# Patient Record
Sex: Male | Born: 1990
Health system: Southern US, Community
[De-identification: ages and names within clinical notes are randomized; demographics above are authoritative.]

## PROBLEM LIST (undated history)

## (undated) VITALS — BP 101/66 | HR 120 | Temp 97.9°F | Resp 20 | Ht 62.0 in | Wt 117.0 lb

## (undated) DIAGNOSIS — Z973 Presence of spectacles and contact lenses: Secondary | ICD-10-CM

## (undated) DIAGNOSIS — F419 Anxiety disorder, unspecified: Secondary | ICD-10-CM

## (undated) DIAGNOSIS — M549 Dorsalgia, unspecified: Secondary | ICD-10-CM

## (undated) DIAGNOSIS — H3530 Unspecified macular degeneration: Secondary | ICD-10-CM

## (undated) DIAGNOSIS — M1990 Unspecified osteoarthritis, unspecified site: Secondary | ICD-10-CM

## (undated) DIAGNOSIS — Z950 Presence of cardiac pacemaker: Secondary | ICD-10-CM

## (undated) DIAGNOSIS — G8929 Other chronic pain: Secondary | ICD-10-CM

## (undated) DIAGNOSIS — G43909 Migraine, unspecified, not intractable, without status migrainosus: Secondary | ICD-10-CM

## (undated) DIAGNOSIS — K219 Gastro-esophageal reflux disease without esophagitis: Secondary | ICD-10-CM

## (undated) DIAGNOSIS — R519 Headache, unspecified: Secondary | ICD-10-CM

## (undated) DIAGNOSIS — I429 Cardiomyopathy, unspecified: Secondary | ICD-10-CM

## (undated) DIAGNOSIS — R51 Headache: Secondary | ICD-10-CM

## (undated) DIAGNOSIS — Q246 Congenital heart block: Secondary | ICD-10-CM

## (undated) DIAGNOSIS — F909 Attention-deficit hyperactivity disorder, unspecified type: Secondary | ICD-10-CM

## (undated) HISTORY — PX: HERNIA REPAIR: SHX51

## (undated) HISTORY — PX: UMBILICAL HERNIA REPAIR: SHX196

---
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
## 2009-10-09 ENCOUNTER — Emergency Department (HOSPITAL_COMMUNITY): Admission: EM | Admit: 2009-10-09 | Discharge: 2009-10-09 | Payer: Self-pay | Admitting: Emergency Medicine

## 2009-10-25 ENCOUNTER — Observation Stay (HOSPITAL_COMMUNITY): Admission: EM | Admit: 2009-10-25 | Discharge: 2009-10-26 | Payer: Self-pay | Admitting: Emergency Medicine

## 2009-10-26 ENCOUNTER — Encounter (INDEPENDENT_AMBULATORY_CARE_PROVIDER_SITE_OTHER): Payer: Self-pay | Admitting: Nurse Practitioner

## 2010-01-03 ENCOUNTER — Ambulatory Visit: Admit: 2010-01-03 | Payer: Self-pay | Admitting: Internal Medicine

## 2010-01-31 NOTE — Letter (Signed)
Summary: Discharge Summary  Discharge Summary   Imported By: Arta Bruce 11/22/2009 10:14:07  _____________________________________________________________________  External Attachment:    Type:   Image     Comment:   External Document

## 2010-03-15 LAB — URINALYSIS, ROUTINE W REFLEX MICROSCOPIC
Bilirubin Urine: NEGATIVE
Nitrite: NEGATIVE
Specific Gravity, Urine: 1.023 (ref 1.005–1.030)
Urobilinogen, UA: 1 mg/dL (ref 0.0–1.0)

## 2010-03-15 LAB — TSH: TSH: 0.801 u[IU]/mL (ref 0.350–4.500)

## 2010-03-15 LAB — CBC
HCT: 46.5 % (ref 39.0–52.0)
Hemoglobin: 15.9 g/dL (ref 13.0–17.0)
MCH: 30.8 pg (ref 26.0–34.0)
MCHC: 34.2 g/dL (ref 30.0–36.0)
MCV: 90.1 fL (ref 78.0–100.0)

## 2010-03-15 LAB — POCT CARDIAC MARKERS: CKMB, poc: <1 ng/mL — ABNORMAL LOW (ref 1.0–8.0)

## 2010-03-15 LAB — BASIC METABOLIC PANEL
BUN: 14 mg/dL (ref 6–23)
CO2: 28 mEq/L (ref 19–32)
Glucose, Bld: 77 mg/dL (ref 70–99)
Potassium: 4.1 mEq/L (ref 3.5–5.1)
Sodium: 140 mEq/L (ref 135–145)

## 2010-03-15 LAB — DIFFERENTIAL
Basophils Relative: 1 % (ref 0–1)
Eosinophils Absolute: 0.3 10*3/uL (ref 0.0–0.7)
Eosinophils Relative: 5 % (ref 0–5)
Monocytes Absolute: 0.4 10*3/uL (ref 0.1–1.0)
Monocytes Relative: 6 % (ref 3–12)

## 2010-03-15 LAB — PROTIME-INR: INR: 1.01 (ref 0.00–1.49)

## 2010-03-15 LAB — APTT: aPTT: 30 seconds (ref 24–37)

## 2010-03-16 LAB — CBC
HCT: 46.7 % (ref 39.0–52.0)
MCHC: 35.3 g/dL (ref 30.0–36.0)
RDW: 13 % (ref 11.5–15.5)

## 2010-03-16 LAB — POCT I-STAT, CHEM 8
Calcium, Ion: 1.17 mmol/L (ref 1.12–1.32)
HCT: 52 % (ref 39.0–52.0)
Hemoglobin: 17.7 g/dL — ABNORMAL HIGH (ref 13.0–17.0)
TCO2: 27 mmol/L (ref 0–100)

## 2010-03-16 LAB — POCT CARDIAC MARKERS

## 2010-03-16 LAB — DIFFERENTIAL
Basophils Absolute: 0.1 10*3/uL (ref 0.0–0.1)
Basophils Relative: 1 % (ref 0–1)
Eosinophils Relative: 6 % — ABNORMAL HIGH (ref 0–5)
Monocytes Absolute: 0.4 10*3/uL (ref 0.1–1.0)
Neutro Abs: 1.8 10*3/uL (ref 1.7–7.7)

## 2011-01-04 ENCOUNTER — Emergency Department (INDEPENDENT_AMBULATORY_CARE_PROVIDER_SITE_OTHER)
Admission: EM | Admit: 2011-01-04 | Discharge: 2011-01-04 | Disposition: A | Discharge status: Home / Self Care | Payer: Medicaid Other | Source: Home / Self Care | Attending: Family Medicine | Admitting: Family Medicine

## 2011-01-04 DIAGNOSIS — B86 Scabies: Secondary | ICD-10-CM

## 2011-01-04 DIAGNOSIS — Z9189 Other specified personal risk factors, not elsewhere classified: Secondary | ICD-10-CM

## 2011-01-04 DIAGNOSIS — Z202 Contact with and (suspected) exposure to infections with a predominantly sexual mode of transmission: Secondary | ICD-10-CM

## 2011-01-04 DIAGNOSIS — J40 Bronchitis, not specified as acute or chronic: Secondary | ICD-10-CM

## 2011-01-04 HISTORY — DX: Attention-deficit hyperactivity disorder, unspecified type: F90.9

## 2011-01-04 HISTORY — DX: Anxiety disorder, unspecified: F41.9

## 2011-01-04 MED ORDER — AZITHROMYCIN 250 MG PO TABS: 250.0000 mg | ORAL_TABLET | Freq: Every day | ORAL | Status: AC

## 2011-01-04 MED ORDER — PERMETHRIN 5 % EX CREA: TOPICAL_CREAM | CUTANEOUS | Status: AC

## 2011-01-04 MED ORDER — PREDNISONE 20 MG PO TABS: ORAL_TABLET | ORAL | Status: AC

## 2011-01-04 NOTE — ED Provider Notes (Signed)
History     CSN: [id number]  Arrival date & time 01/04/11  1430   First MD Initiated Contact with Patient 01/04/11 1605      Chief Complaint  Patient presents with  . Rash  . 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 Here c/o productive cough with yellow sputum, wheezing, congestion and runny nose for 4 days. No fever or chills lives with twin brother who also smokes and was recently diagnosed with bronchitis and scabies. Has itching rash similar to the one his brother has.. Also reports unprotected sex in last 3 months and wants to be checked for HIV. Denies discharge from penis or burning on urination.    Past Medical History  Diagnosis Date  . Asthma   . Attention deficit hyperactivity disorder (ADHD)   . Anxiety     Past Surgical History  Procedure Date  . Cardiac pacemaker placement     History reviewed. No pertinent family history.  History  Substance Use Topics  . Smoking status: Current Everyday Smoker -- 1.0 packs/day    Types: Cigarettes  . Smokeless tobacco: Not on file  . Alcohol Use: No      Review of Systems  Constitutional: Negative for fever, chills and appetite change.  HENT: Positive for congestion, sore throat and rhinorrhea. Negative for trouble swallowing, neck pain, neck stiffness and voice change.   Respiratory: Positive for cough and wheezing. Negative for chest tightness and shortness of breath.   Gastrointestinal: Negative for abdominal pain.  Genitourinary: Negative for dysuria, frequency, hematuria, flank pain, discharge and testicular pain.  Skin: Positive for rash.    Allergies  Shellfish allergy  Home Medications   Current Outpatient Rx  Name Route Sig Dispense Refill  . ALBUTEROL IN Inhalation Inhale into the lungs as needed.      .  METHYLPHENIDATE HCL ER 36 MG PO TBCR Oral Take 36 mg by mouth every morning.      Marland Kitchen MIRTAZAPINE 30 MG PO TABS Oral Take 30 mg by mouth at bedtime.      . SERTRALINE HCL 50 MG PO TABS Oral Take 50 mg by mouth daily.        BP 103/64  Pulse 65  Temp(Src) 98.7 F (37.1 C) (Oral)  Resp 16  SpO2 100%  Physical Exam  Nursing note and vitals reviewed. Constitutional: He is oriented to person, place, and time. He appears well-developed and well-nourished. No distress.  HENT:  Head: Normocephalic and atraumatic.  Nose: Mucosal edema and rhinorrhea present. Right sinus exhibits no maxillary sinus tenderness and no frontal sinus tenderness. Left sinus exhibits no maxillary sinus tenderness and no frontal sinus tenderness.  Mouth/Throat: Uvula is midline and mucous membranes are normal. Posterior oropharyngeal erythema present. No oropharyngeal exudate, posterior oropharyngeal edema or tonsillar abscesses.  Neck: No JVD present.  Cardiovascular: Normal rate, regular rhythm and normal heart sounds.   Pulmonary/Chest: Effort normal. Not tachypneic. No respiratory distress. He has wheezes. He has rhonchi. He has no rales. He exhibits no tenderness.       Mild sporadic wheezing and ronchi bilaterally.  Musculoskeletal: He exhibits no edema.  Lymphadenopathy:    He has no cervical adenopathy.  Neurological: He is alert and oriented to person, place, and time.  Skin: Rash noted.       Papular rash in hands arms, low abdomen with scratch  linear marks. No base swelling, pustules or drainage.    ED Course  Procedures (including critical care time)   Labs Reviewed  HIV ANTIBODY (ROUTINE TESTING)  RPR   No results found.   1. Bronchitis   2. Scabies   3. Possible exposure to STD       MDM  Treated with prednisone, azithromycin and permethrin. HIV and RPR pending. consistent condom use discussed.        Sharin Grave, MD 01/05/11 1515

## 2011-01-04 NOTE — ED Notes (Signed)
rx for azithromycin 250 mg. .permethrin 5% cream prednisone 20 mg tab given to pt

## 2011-01-04 NOTE — ED Notes (Signed)
C/o productive cough of yellow sputum, congestion, sneezing and runny nose for 4 days.  Also states his brother whom he rooms with was diagnosed with scabies. States he also has itchy rash he is concerned about.

## 2011-01-08 NOTE — ED Notes (Signed)
HIV non- reactive, RPR reactive, Titer 1:4, T Palladium- 0.07 non -reactive.  Labs shown to Dr. Alfonse Ras- Coll. She said to call pt. and tell him he has had an exposure and to see if he was ever treated in the past.   I called pt.and verified x2. Pt. said, no, he never had it and then he said, I can't remember. Dr. notified. She said tell pt. we will call him back after she reviews the CDC guidelines and we will let him know if he needs a PCN injection.  Pt. notified. Mario Wyatt 01/08/2011

## 2011-01-15 ENCOUNTER — Telehealth (HOSPITAL_COMMUNITY): Payer: Self-pay | Admitting: *Deleted

## 2011-01-17 ENCOUNTER — Telehealth (HOSPITAL_COMMUNITY): Payer: Self-pay | Admitting: *Deleted

## 2011-01-17 NOTE — Telephone Encounter (Signed)
Message copied by Vassie Moselle on Wed Jan 17, 2011  5:25 PM ------      Message from: Sharin Grave      Created: Sat Jan 13, 2011  5:06 PM       No further actions needed.      ----- Message -----         From: Vassie Moselle, RN         Sent: 01/08/2011  12:39 PM           To: Sharin Grave, MD            T Palladium 0,07- non reactive.  So do I need to do anything further?      Vassie Moselle      01/08/2011

## 2011-01-19 ENCOUNTER — Telehealth (HOSPITAL_COMMUNITY): Payer: Self-pay | Admitting: *Deleted

## 2011-01-19 NOTE — ED Notes (Signed)
Pt. notified no further treatment needed. Mario Wyatt 01/17/11

## 2011-03-10 ENCOUNTER — Encounter (HOSPITAL_COMMUNITY): Payer: Self-pay | Admitting: *Deleted

## 2011-03-10 ENCOUNTER — Emergency Department (HOSPITAL_COMMUNITY)
Admission: EM | Admit: 2011-03-10 | Discharge: 2011-03-14 | Disposition: A | Discharge status: Other Acute Inpatient Hospital | Payer: Self-pay | Attending: Emergency Medicine | Admitting: Emergency Medicine

## 2011-03-10 DIAGNOSIS — J45909 Unspecified asthma, uncomplicated: Secondary | ICD-10-CM | POA: Insufficient documentation

## 2011-03-10 DIAGNOSIS — F29 Unspecified psychosis not due to a substance or known physiological condition: Secondary | ICD-10-CM | POA: Insufficient documentation

## 2011-03-10 DIAGNOSIS — F411 Generalized anxiety disorder: Secondary | ICD-10-CM | POA: Insufficient documentation

## 2011-03-10 DIAGNOSIS — Z950 Presence of cardiac pacemaker: Secondary | ICD-10-CM | POA: Insufficient documentation

## 2011-03-10 DIAGNOSIS — F909 Attention-deficit hyperactivity disorder, unspecified type: Secondary | ICD-10-CM | POA: Insufficient documentation

## 2011-03-10 HISTORY — DX: Presence of cardiac pacemaker: Z95.0

## 2011-03-10 LAB — CBC
HCT: 46.2 % (ref 39.0–52.0)
Hemoglobin: 16.7 g/dL (ref 13.0–17.0)
MCH: 31 pg (ref 26.0–34.0)
MCV: 85.9 fL (ref 78.0–100.0)
RBC: 5.38 MIL/uL (ref 4.22–5.81)

## 2011-03-10 LAB — RAPID URINE DRUG SCREEN, HOSP PERFORMED
Barbiturates: NOT DETECTED
Cocaine: NOT DETECTED

## 2011-03-10 LAB — COMPREHENSIVE METABOLIC PANEL
ALT: 36 U/L (ref 0–53)
BUN: 21 mg/dL (ref 6–23)
CO2: 23 mEq/L (ref 19–32)
Calcium: 10.7 mg/dL — ABNORMAL HIGH (ref 8.4–10.5)
Creatinine, Ser: 1.11 mg/dL (ref 0.50–1.35)
GFR calc Af Amer: >90 mL/min (ref >90)
GFR calc non Af Amer: >90 mL/min (ref >90)
Glucose, Bld: 129 mg/dL — ABNORMAL HIGH (ref 70–99)
Sodium: 138 mEq/L (ref 135–145)

## 2011-03-10 LAB — DIFFERENTIAL
Eosinophils Relative: 1 % (ref 0–5)
Lymphocytes Relative: 22 % (ref 12–46)
Lymphs Abs: 1.2 10*3/uL (ref 0.7–4.0)
Neutrophils Relative %: 67 % (ref 43–77)

## 2011-03-10 LAB — ETHANOL: Alcohol, Ethyl (B): <11 mg/dL (ref 0–11)

## 2011-03-10 MED ORDER — ZIPRASIDONE MESYLATE 20 MG IM SOLR: 10.0000 mg | Freq: Four times a day (QID) | INTRAMUSCULAR | Status: DC | PRN

## 2011-03-10 MED ORDER — ZOLPIDEM TARTRATE 5 MG PO TABS
5.0000 mg | ORAL_TABLET | Freq: Every evening | ORAL | Status: DC | PRN
  Administered 2011-03-11: 5 mg via ORAL
  Filled 2011-03-10 (×2): qty 1

## 2011-03-10 MED ORDER — ZIPRASIDONE MESYLATE 20 MG IM SOLR
INTRAMUSCULAR | Status: AC
  Filled 2011-03-10: qty 20

## 2011-03-10 MED ORDER — IBUPROFEN 200 MG PO TABS: 400.0000 mg | ORAL_TABLET | Freq: Three times a day (TID) | ORAL | Status: DC | PRN

## 2011-03-10 MED ORDER — ZIPRASIDONE MESYLATE 20 MG IM SOLR
10.0000 mg | Freq: Once | INTRAMUSCULAR | Status: AC
  Administered 2011-03-10: 10 mg via INTRAMUSCULAR

## 2011-03-10 MED ORDER — ONDANSETRON HCL 4 MG PO TABS: 4.0000 mg | ORAL_TABLET | Freq: Three times a day (TID) | ORAL | Status: DC | PRN

## 2011-03-10 MED ORDER — LORAZEPAM 1 MG PO TABS
1.0000 mg | ORAL_TABLET | Freq: Three times a day (TID) | ORAL | Status: DC | PRN
  Administered 2011-03-11 – 2011-03-12 (×2): 1 mg via ORAL
  Filled 2011-03-10 (×3): qty 1

## 2011-03-10 MED ORDER — NICOTINE 21 MG/24HR TD PT24: 21.0000 mg | MEDICATED_PATCH | Freq: Every day | TRANSDERMAL | Status: DC | PRN

## 2011-03-10 MED ORDER — ACETAMINOPHEN 325 MG PO TABS: 650.0000 mg | ORAL_TABLET | ORAL | Status: DC | PRN

## 2011-03-10 MED ORDER — RISPERIDONE 2 MG PO TABS
2.0000 mg | ORAL_TABLET | Freq: Two times a day (BID) | ORAL | Status: DC
  Administered 2011-03-10 – 2011-03-14 (×8): 2 mg via ORAL
  Filled 2011-03-10 (×8): qty 1

## 2011-03-10 MED ORDER — ALUM & MAG HYDROXIDE-SIMETH 200-200-20 MG/5ML PO SUSP: 30.0000 mL | ORAL | Status: DC | PRN

## 2011-03-10 NOTE — ED Notes (Signed)
Security in to wand pt and pt's personal belongings. 

## 2011-03-10 NOTE — ED Notes (Signed)
Pt initially agreed to take Ambien and Ativan p.o.  When took meds to pt, he said he was not going to take them.  He wanted to get a shot.  He appears agitated, pacing in hallway at times while holding Bible. He states he wants to leave and keeps walking toward doors but then veers away. M.D. Notified.

## 2011-03-10 NOTE — ED Notes (Signed)
Tele-psych being done. 
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
## 2011-03-10 NOTE — ED Provider Notes (Signed)
D/W Telepsych Dr. Henderson Cloud-- recommends inpatient admission. Risper 2mg  bid. Ordered.  Forbes Cellar, MD 03/10/11 [phone]

## 2011-03-10 NOTE — ED Provider Notes (Signed)
History     CSN: [id number]  Arrival date & time 03/10/11  3664   First MD Initiated Contact with Patient 03/10/11 0940      Chief Complaint  Patient presents with  . V70.1    The history is provided by the patient. History Limited By: psychosis.   Pt was seen at 0935.  Per pt, c/o gradual onset and persistence of constant auditory hallucinations for an unknown period of time.  Pt states he has been "praying" to "be at peace" and "heard the spirit tell me to come to the ER."  Will not be more forthcoming with information.      Past Medical History  Diagnosis Date  . Asthma   . Attention deficit hyperactivity disorder (ADHD)   . Anxiety   . Pacemaker     Placed when pt was 21 years old.    Past Surgical History  Procedure Date  . Cardiac pacemaker placement     History  Substance Use Topics  . Smoking status: Former Smoker -- 1.0 packs/day    Types: Cigarettes    Quit date: 02/24/2011  . Smokeless tobacco: Never Used  . Alcohol Use: No    Review of Systems  Unable to perform ROS: Psychiatric disorder    Allergies  Shellfish allergy  Home Medications   Current Outpatient Rx  Name Route Sig Dispense Refill  . ALBUTEROL SULFATE HFA 108 (90 BASE) MCG/ACT IN AERS Inhalation Inhale 2 puffs into the lungs every 6 (six) hours as needed. For shortness of breath.    . BECLOMETHASONE DIPROPIONATE 80 MCG/ACT IN AERS Inhalation Inhale 2 puffs into the lungs 2 (two) times daily.    . METHYLPHENIDATE HCL ER 36 MG PO TBCR Oral Take 36 mg by mouth every morning.      Marland Kitchen MIRTAZAPINE 15 MG PO TABS Oral Take 15 mg by mouth at bedtime.    . SERTRALINE HCL 50 MG PO TABS Oral Take 50 mg by mouth daily.        BP 122/76  Pulse 80  Temp(Src) 98 F (36.7 C) (Oral)  Resp 28  Wt 120 lb (54.432 kg)  SpO2 100%  Physical Exam 0940: Physical examination:  Nursing notes reviewed; Vital signs and O2 SAT reviewed;  Constitutional: Well developed, Well nourished, Well hydrated, Poor  hygiene, In no acute distress; Head:  Normocephalic, atraumatic; Eyes: EOMI, PERRL, No scleral icterus; ENMT: Mouth and pharynx normal, Mucous membranes moist; Neck: Supple, Full range of motion, No lymphadenopathy; Cardiovascular: Regular rate and rhythm, No murmur, rub, or gallop; Respiratory: Breath sounds clear & equal bilaterally, No rales, rhonchi, wheezes, or rub, Normal respiratory effort/excursion; Chest: Nontender, Movement normal; Extremities: Pulses normal, No tenderness, No edema, No calf edema or asymmetry.; Neuro: Awake, alert, speech clear, Major CN grossly intact.  No gross focal motor or sensory deficits in extremities.; Skin: Color normal, Warm, Dry, no rash; Psych:  Affect flat, poor eye contact, tangential, appears responding to internal stimuli.    ED Course  Procedures   1000:  Pt saying to multiple staff that the voices are "telling me to leave now."  +IVC paperwork completed.  1430:  Pt apparently becoming more agitated, walking the hallways disturbing other pt's.  Will dose IM geodon.  ACT team has eval pt, will need admit.  Holding orders written.   MDM  MDM Reviewed: nursing note and vitals Interpretation: labs   Results for orders placed during the hospital encounter of 03/10/11  CBC  Component Value Range   WBC 5.7  4.0 - 10.5 (K/uL)   RBC 5.38  4.22 - 5.81 (MIL/uL)   Hemoglobin 16.7  13.0 - 17.0 (g/dL)   HCT 16.1  09.6 - 04.5 (%)   MCV 85.9  78.0 - 100.0 (fL)   MCH 31.0  26.0 - 34.0 (pg)   MCHC 36.1 (*) 30.0 - 36.0 (g/dL)   RDW 40.9  81.1 - 91.4 (%)   Platelets 255  150 - 400 (K/uL)  COMPREHENSIVE METABOLIC PANEL      Component Value Range   Sodium 138  135 - 145 (mEq/L)   Potassium 3.4 (*) 3.5 - 5.1 (mEq/L)   Chloride 98  96 - 112 (mEq/L)   CO2 23  19 - 32 (mEq/L)   Glucose, Bld 129 (*) 70 - 99 (mg/dL)   BUN 21  6 - 23 (mg/dL)   Creatinine, Ser 7.82  0.50 - 1.35 (mg/dL)   Calcium 95.6 (*) 8.4 - 10.5 (mg/dL)   Total Protein 8.5 (*) 6.0 - 8.3  (g/dL)   Albumin 5.3 (*) 3.5 - 5.2 (g/dL)   AST 33  0 - 37 (U/L)   ALT 36  0 - 53 (U/L)   Alkaline Phosphatase 72  39 - 117 (U/L)   Total Bilirubin 1.2  0.3 - 1.2 (mg/dL)   GFR calc non Af Amer >90  >90 (mL/min)   GFR calc Af Amer >90  >90 (mL/min)  ETHANOL      Component Value Range   Alcohol, Ethyl (B) <11  0 - 11 (mg/dL)  URINE RAPID DRUG SCREEN (HOSP PERFORMED)      Component Value Range   Opiates NONE DETECTED  NONE DETECTED    Cocaine NONE DETECTED  NONE DETECTED    Benzodiazepines NONE DETECTED  NONE DETECTED    Amphetamines NONE DETECTED  NONE DETECTED    Tetrahydrocannabinol NONE DETECTED  NONE DETECTED    Barbiturates NONE DETECTED  NONE DETECTED   DIFFERENTIAL      Component Value Range   Neutrophils Relative 67  43 - 77 (%)   Neutro Abs 3.9  1.7 - 7.7 (K/uL)   Lymphocytes Relative 22  12 - 46 (%)   Lymphs Abs 1.2  0.7 - 4.0 (K/uL)   Monocytes Relative 9  3 - 12 (%)   Monocytes Absolute 0.5  0.1 - 1.0 (K/uL)   Eosinophils Relative 1  0 - 5 (%)   Eosinophils Absolute 0.1  0.0 - 0.7 (K/uL)   Basophils Relative 1  0 - 1 (%)   Basophils Absolute 0.0  0.0 - 0.1 (K/uL)            Laray Anger, DO 03/12/11 1140

## 2011-03-10 NOTE — BH Assessment (Signed)
Assessment Note   Mario Wyatt is an 21 y.o. male.   Pt is psychotic with delusional behaviors involving religious preoccupation.  Pt is quoting scriptures out of [employer] Pacas of the [place] [employer].  Pt does not appear to have many verses memorized based on observation.  Pt has been cooperative and non-combative, however, he does appear to be rigid at times when reading scripture and transitioning or redirecting pt is difficult until he has finished his ritual.  Pt would not communicate with ACT on assessment questions but it did not appear to be out of defiance but out of suspicion and paranoid.   Pt will be referred to [facility] for a 400 Hall bed once available.    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 Asthma   . Attention deficit hyperactivity disorder (ADHD)   . Anxiety   . Pacemaker     Placed when pt was 21 years old.   Axis IV: other psychosocial or environmental problems, problems related to social environment and problems with primary support group Axis V: 21-30 behavior considerably influenced by delusions or hallucinations OR serious impairment in judgment, communication OR inability to function in almost all areas  Past Medical History:  Past Medical History  Diagnosis Date  . Asthma   . Attention deficit hyperactivity disorder (ADHD)   . Anxiety   . Pacemaker     Placed when pt was 22 years old.    Past Surgical History  Procedure Date  . Cardiac pacemaker placement     Family History: History reviewed. No pertinent family history.  Social History:  reports that he quit smoking about 2 weeks ago. His smoking use included Cigarettes. He smoked 1 pack per day. He has never used smokeless tobacco. He reports that he does not drink alcohol or use illicit drugs.  Additional Social History:  Alcohol / Drug Use Pain Medications: na Prescriptions: na Over the Counter: na History of  alcohol / drug use?: No history of alcohol / drug abuse Longest period of sobriety (when/how long): na Allergies:  Allergies  Allergen Reactions  . Shellfish Allergy     Home Medications:  Medications Prior to Admission  Medication Dose Route Frequency Provider Last Rate Last Dose  . ziprasidone (GEODON) injection 10 mg  10 mg Intramuscular Once Laray Anger, DO       Medications Prior to Admission  Medication Sig Dispense Refill  . methylphenidate (CONCERTA) 36 MG CR tablet Take 36 mg by mouth every morning.        . sertraline (ZOLOFT) 50 MG tablet Take 50 mg by mouth daily.          OB/GYN Status:  No LMP for male patient.  General Assessment Data Location of Assessment: [facility] ED ACT Assessment: Yes Living Arrangements: Alone Can pt return to current living arrangement?: Yes Admission Status: Involuntary Is patient capable of signing voluntary admission?: No Transfer from: Acute Hospital Referral Source: MD  Education Status Is patient currently in school?: No  Risk to self Suicidal Ideation: No-Not Currently/Within Last 6 Months Suicidal Intent: No-Not Currently/Within Last 6 Months Is patient at risk for suicide?: No Suicidal Plan?: No-Not Currently/Within Last 6 Months Access to Means: No What has been your use of drugs/alcohol within the last 12 months?: 0 Previous Attempts/Gestures: No How many times?: 0  Other Self Harm Risks: 0 Triggers for Past Attempts: None known Intentional Self Injurious  Behavior: None Family Suicide History: No Recent stressful life event(s): Trauma (Comment) (psychotic issue) Persecutory voices/beliefs?: Yes Depression: No Substance abuse history and/or treatment for substance abuse?: No (na) Suicide prevention information given to non-admitted patients: Not applicable  Risk to Others Homicidal Ideation: No Thoughts of Harm to Others: No Current Homicidal Intent: No Current Homicidal Plan: No Access to Homicidal Means:  No Identified Victim: 0 History of harm to others?: No Assessment of Violence: None Noted Violent Behavior Description: none Does patient have access to weapons?: No Criminal Charges Pending?: No Does patient have a court date: No  Psychosis Hallucinations: None noted Delusions: None noted  Mental Status Report Appear/Hygiene: Disheveled Eye Contact: Poor Motor Activity: Freedom of movement;Restlessness Speech: Tangential;Slow Level of Consciousness: Alert Mood: Anxious;Suspicious Affect: Blunted;Preoccupied Anxiety Level: Severe Thought Processes: Tangential;Flight of Ideas Judgement: Impaired Orientation: Person Obsessive Compulsive Thoughts/Behaviors: Severe  Cognitive Functioning Concentration: Decreased Memory: Recent Impaired;Remote Impaired IQ: Average Insight: Poor Impulse Control: Poor Appetite: Fair Weight Loss: 0  Weight Gain: 0  Sleep: Decreased Total Hours of Sleep: 5  Vegetative Symptoms: None  Prior Inpatient Therapy Prior Inpatient Therapy: No (unknown) Prior Therapy Dates: 0 Prior Therapy Facilty/Provider(s): 0 Reason for Treatment: 0  Prior Outpatient Therapy Prior Outpatient Therapy: No (unknown) Prior Therapy Dates: no Prior Therapy Facilty/Provider(s): no Reason for Treatment: no  ADL Screening (condition at time of admission) Patient's cognitive ability adequate to safely complete daily activities?: Yes Patient able to express need for assistance with ADLs?: Yes Independently performs ADLs?: Yes Weakness of Legs: None Weakness of Arms/Hands: None  Home Assistive Devices/Equipment Home Assistive Devices/Equipment: None  Therapy Consults (therapy consults require a physician order) PT Evaluation Needed: No OT Evalulation Needed: No SLP Evaluation Needed: No Abuse/Neglect Assessment (Assessment to be complete while patient is alone) Physical Abuse: Denies Verbal Abuse: Denies Sexual Abuse: Denies Exploitation of  patient/patient's resources: Denies Self-Neglect: Denies Values / Beliefs Cultural Requests During Hospitalization: None Spiritual Requests During Hospitalization: None Consults Spiritual Care Consult Needed: No Social Work Consult Needed: No Merchant navy officer (For Healthcare) Advance Directive: Patient does not have advance directive Pre-existing out of facility DNR order (yellow form or pink MOST form): No Nutrition Screen Diet: Regular Unintentional weight loss greater than 10lbs within the last month: No Dysphagia: No Home Tube Feeding or Total Parenteral Nutrition (TPN): No Patient appears severely malnourished: No Pregnant or Lactating: No Dietitian Consult Needed: No  Additional Information 1:1 In Past 12 Months?: No CIRT Risk: No Elopement Risk: No Does patient have medical clearance?: No     Disposition:   Pt needs 400 hall bed at [facility] when available.  No beds elsewhere at this time.  Recommend Tele Psych for med recommendations to begin to stabilize pt. Disposition Disposition of Patient: Inpatient treatment program Type of inpatient treatment program: Adult  On Site Evaluation by:   Reviewed with Physician:     Titus Mould, Eppie Gibson 03/10/2011 4:00 PM

## 2011-03-10 NOTE — ED Notes (Signed)
Please call brother Eulon Allnutt [phone] if pt transported.

## 2011-03-10 NOTE — ED Notes (Signed)
Report called to [facility] RN in [place], pt to go to room 41, has been changed to blue scrubs and wanded, pt has glasses with him.

## 2011-03-10 NOTE — ED Notes (Signed)
MD at bedside. 

## 2011-03-10 NOTE — ED Notes (Signed)
Pt being assisted to change into blue scrubs by Security, pt threatening to leave, to be IVC per Dr. Clarene Duke.

## 2011-03-10 NOTE — ED Notes (Signed)
Pt from home with reports of not being at peace, pt quoting Bible scriptures and easily agitated/anxious.
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
## 2011-03-10 NOTE — ED Notes (Signed)
Telepsych completed.  

## 2011-03-11 NOTE — BH Assessment (Signed)
Assessment Note   Mario Wyatt is an 21 y.o. male.   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  Information is difficult to retrieve from pt.  Pt brother provided insight to nurse, Archie Patten, yesterday but unknown if info is accurate.  Pt has had a Tele Psych and recommend Inptx and started pt on Risperdal protocol.  Pt is under IVC  Axis I: Psychotic Disorder NOS Axis II: Deferred Axis III:  Past Medical History  Diagnosis Date  . Asthma   . Attention deficit hyperactivity disorder (ADHD)   . Anxiety   . Pacemaker     Placed when pt was 20 years old.   Axis IV: other psychosocial or environmental problems, problems related to social environment and problems with primary support group Axis V: 21-30 behavior considerably influenced by delusions or hallucinations OR serious impairment in judgment, communication OR inability to function in almost all areas  Past Medical History:  Past Medical History  Diagnosis Date  . Asthma   . Attention deficit hyperactivity disorder (ADHD)   . Anxiety   . Pacemaker     Placed when pt was 21 years old.    Past Surgical History  Procedure Date  . Cardiac pacemaker placement     Family History: History reviewed. No pertinent family history.  Social History:  reports that he quit smoking about 2 weeks ago. His smoking use included Cigarettes. He smoked 1 pack per day. He has never used smokeless tobacco. He reports that he does not drink alcohol or use illicit drugs.  Additional Social History:  Alcohol / Drug Use Pain Medications: na Prescriptions: na Over the Counter: na History of alcohol / drug use?: No history of alcohol / drug abuse Longest period of sobriety (when/how long): na Allergies:  Allergies  Allergen Reactions  . Shellfish Allergy Anaphylaxis    Home Medications:  Medications Prior to Admission  Medication Dose Route Frequency Provider Last Rate Last Dose  .  acetaminophen (TYLENOL) tablet 650 mg  650 mg Oral Q4H PRN Laray Anger, DO      . alum & mag hydroxide-simeth (MAALOX/MYLANTA) 200-200-20 MG/5ML suspension 30 mL  30 mL Oral PRN Laray Anger, DO      . ibuprofen (ADVIL,MOTRIN) tablet 400 mg  400 mg Oral Q8H PRN Laray Anger, DO      . LORazepam (ATIVAN) tablet 1 mg  1 mg Oral Q8H PRN Laray Anger, DO   1 mg at 03/11/11 1030  . nicotine (NICODERM CQ - dosed in mg/24 hours) patch 21 mg  21 mg Transdermal Daily PRN Laray Anger, DO      . ondansetron [facility]) tablet 4 mg  4 mg Oral Q8H PRN Laray Anger, DO      . risperiDONE (RISPERDAL) tablet 2 mg  2 mg Oral BID Forbes Cellar, MD   2 mg at 03/11/11 1029  . ziprasidone (GEODON) injection 10 mg  10 mg Intramuscular Once Laray Anger, DO   10 mg at 03/10/11 1457  . ziprasidone (GEODON) injection 10 mg  10 mg Intramuscular Q6H PRN Forbes Cellar, MD      . zolpidem (AMBIEN) tablet 5 mg  5 mg Oral QHS PRN Laray Anger, DO       Medications Prior to Admission  Medication Sig Dispense Refill  . methylphenidate (CONCERTA) 36 MG CR tablet Take 36 mg by mouth every morning.        Marland Kitchen  sertraline (ZOLOFT) 50 MG tablet Take 50 mg by mouth daily.          OB/GYN Status:  No LMP for male patient.  General Assessment Data Location of Assessment: [facility] ACT Assessment: Yes Living Arrangements: Alone (maybe with brother he is up in [place]) Can pt return to current living arrangement?: Yes Admission Status: Involuntary Is patient capable of signing voluntary admission?: No Transfer from: Acute Hospital Referral Source: MD  Education Status Is patient currently in school?: No  Risk to self Suicidal Ideation: No-Not Currently/Within Last 6 Months Suicidal Intent: No-Not Currently/Within Last 6 Months Is patient at risk for suicide?: No Suicidal Plan?: No-Not Currently/Within Last 6 Months Access to Means: No What has been your use of drugs/alcohol  within the last 12 months?: 0 Previous Attempts/Gestures: No How many times?: 0  Other Self Harm Risks: 0 Triggers for Past Attempts: None known Intentional Self Injurious Behavior: None Family Suicide History: No Recent stressful life event(s): Trauma (Comment) (brother reports possible PTSD issues) Persecutory voices/beliefs?: Yes Depression: No Substance abuse history and/or treatment for substance abuse?: No Suicide prevention information given to non-admitted patients: Not applicable  Risk to Others Homicidal Ideation: No Thoughts of Harm to Others: No Current Homicidal Intent: No Current Homicidal Plan: No Access to Homicidal Means: No Identified Victim: 0 History of harm to others?: No Assessment of Violence: None Noted Violent Behavior Description: none Does patient have access to weapons?: No Criminal Charges Pending?: No Does patient have a court date: No  Psychosis Hallucinations: Visual;Auditory Delusions: Persecutory;Unspecified  Mental Status Report Appear/Hygiene: Disheveled Eye Contact: Fair Motor Activity: Restlessness Speech: Incoherent;Tangential Level of Consciousness: Alert Mood: Anxious;Suspicious Affect: Blunted;Preoccupied Anxiety Level: Severe Thought Processes: Tangential;Irrelevant Judgement: Impaired Orientation: Person Obsessive Compulsive Thoughts/Behaviors: Severe  Cognitive Functioning Concentration: Decreased Memory: Recent Impaired;Remote Impaired IQ: Average Insight: Poor Impulse Control: Poor Appetite: Fair Weight Loss: 0  Weight Gain: 0  Sleep: Increased (In ED) Total Hours of Sleep: 8  Vegetative Symptoms: None  Prior Inpatient Therapy Prior Inpatient Therapy: No Prior Therapy Dates: 0 Prior Therapy Facilty/Provider(s): 0 Reason for Treatment: 0  Prior Outpatient Therapy Prior Outpatient Therapy: No Prior Therapy Dates: no Prior Therapy Facilty/Provider(s): no Reason for Treatment: no  ADL Screening (condition  at time of admission) Patient's cognitive ability adequate to safely complete daily activities?: Yes Patient able to express need for assistance with ADLs?: Yes Independently performs ADLs?: Yes Weakness of Legs: None Weakness of Arms/Hands: None  Home Assistive Devices/Equipment Home Assistive Devices/Equipment: None  Therapy Consults (therapy consults require a physician order) PT Evaluation Needed: No OT Evalulation Needed: No SLP Evaluation Needed: No Abuse/Neglect Assessment (Assessment to be complete while patient is alone) Physical Abuse: Denies Verbal Abuse: Denies Sexual Abuse: Denies Exploitation of patient/patient's resources: Denies Self-Neglect: Denies Values / Beliefs Cultural Requests During Hospitalization: None Spiritual Requests During Hospitalization: None Consults Spiritual Care Consult Needed: No Social Work Consult Needed: No Merchant navy officer (For Healthcare) Advance Directive: Patient does not have advance directive Pre-existing out of facility DNR order (yellow form or pink MOST form): No Nutrition Screen Diet: Regular Unintentional weight loss greater than 10lbs within the last month: No Dysphagia: No Home Tube Feeding or Total Parenteral Nutrition (TPN): No Patient appears severely malnourished: No Pregnant or Lactating: No Dietitian Consult Needed: No  Additional Information 1:1 In Past 12 Months?: No CIRT Risk: No Elopement Risk: No Does patient have medical clearance?: No     Disposition:   Pt needs Inptx Disposition Disposition of Patient:  Inpatient treatment program Type of inpatient treatment program: Adult  On Site Evaluation by:   Reviewed with Physician:     Titus Mould, Eppie Gibson 03/11/2011 4:41 PM

## 2011-03-12 NOTE — BHH Counselor (Signed)
Patient accepted to [facility] pending a 400 hall bed.

## 2011-03-12 NOTE — ED Notes (Signed)
Per Everardo Pacific RN at [facility], they are unable to accept pt tonight d/t pt in room is uncontrollable at this time.

## 2011-03-12 NOTE — ED Notes (Signed)
Pt at window advising this nurse to repent my sins.  Pt redirected back to room.

## 2011-03-12 NOTE — BHH Suicide Risk Assessment (Signed)
Pending [facility]

## 2011-03-13 NOTE — Progress Notes (Signed)
Psych ED Group  Facilitated group focused on grief and loss. Group discussed types of losses (re: death, job loss, loss of home/stability, loss of security, loss of sense of self) and identified grief reactions to loss. Group also discussed ways to cope/overcome struggles in life. Group was active and engaged w/ counselor and w/ one another, all members shared openly and supported one another.  Pt was active and engaged in group. Pt shared that he had found great comfort in his faith and used faith in God to cope w/ stressors and life problems. Pt provided support to fellow group member who stated that she was struggling w/ living in a shelter - pt stated that he too has lived in shelter and that he has found comfort in faith.  Eulalia Ellerman B MS, LPCA, NCC

## 2011-03-14 ENCOUNTER — Inpatient Hospital Stay (HOSPITAL_COMMUNITY)
Admission: AD | Admit: 2011-03-14 | Discharge: 2011-03-17 | DRG: 885 | Disposition: A | Discharge status: Home / Self Care | Payer: PRIVATE HEALTH INSURANCE | Source: Ambulatory Visit | Attending: Psychiatry | Admitting: Psychiatry

## 2011-03-14 ENCOUNTER — Encounter (HOSPITAL_COMMUNITY): Payer: Self-pay | Admitting: *Deleted

## 2011-03-14 DIAGNOSIS — F909 Attention-deficit hyperactivity disorder, unspecified type: Secondary | ICD-10-CM

## 2011-03-14 DIAGNOSIS — Z87891 Personal history of nicotine dependence: Secondary | ICD-10-CM

## 2011-03-14 DIAGNOSIS — Z91013 Allergy to seafood: Secondary | ICD-10-CM

## 2011-03-14 DIAGNOSIS — F411 Generalized anxiety disorder: Secondary | ICD-10-CM

## 2011-03-14 DIAGNOSIS — Z9119 Patient's noncompliance with other medical treatment and regimen: Secondary | ICD-10-CM

## 2011-03-14 DIAGNOSIS — Z91199 Patient's noncompliance with other medical treatment and regimen due to unspecified reason: Secondary | ICD-10-CM

## 2011-03-14 DIAGNOSIS — Z87892 Personal history of anaphylaxis: Secondary | ICD-10-CM

## 2011-03-14 DIAGNOSIS — F22 Delusional disorders: Secondary | ICD-10-CM | POA: Diagnosis present

## 2011-03-14 DIAGNOSIS — Z950 Presence of cardiac pacemaker: Secondary | ICD-10-CM

## 2011-03-14 DIAGNOSIS — F4310 Post-traumatic stress disorder, unspecified: Secondary | ICD-10-CM

## 2011-03-14 DIAGNOSIS — J45909 Unspecified asthma, uncomplicated: Secondary | ICD-10-CM

## 2011-03-14 DIAGNOSIS — F29 Unspecified psychosis not due to a substance or known physiological condition: Principal | ICD-10-CM

## 2011-03-14 MED ORDER — DIPHENHYDRAMINE HCL 50 MG PO CAPS: 50.0000 mg | ORAL_CAPSULE | Freq: Four times a day (QID) | ORAL | Status: DC | PRN

## 2011-03-14 MED ORDER — LORAZEPAM 1 MG PO TABS: 1.0000 mg | ORAL_TABLET | Freq: Three times a day (TID) | ORAL | Status: DC | PRN

## 2011-03-14 MED ORDER — ONDANSETRON HCL 4 MG PO TABS: 4.0000 mg | ORAL_TABLET | Freq: Three times a day (TID) | ORAL | Status: DC | PRN

## 2011-03-14 MED ORDER — ACETAMINOPHEN 325 MG PO TABS: 650.0000 mg | ORAL_TABLET | Freq: Four times a day (QID) | ORAL | Status: DC | PRN

## 2011-03-14 MED ORDER — ALUM & MAG HYDROXIDE-SIMETH 200-200-20 MG/5ML PO SUSP: 30.0000 mL | ORAL | Status: DC | PRN

## 2011-03-14 MED ORDER — NICOTINE 21 MG/24HR TD PT24: 21.0000 mg | MEDICATED_PATCH | Freq: Every day | TRANSDERMAL | Status: DC | PRN

## 2011-03-14 MED ORDER — ALBUTEROL SULFATE HFA 108 (90 BASE) MCG/ACT IN AERS
2.0000 | INHALATION_SPRAY | RESPIRATORY_TRACT | Status: DC | PRN
  Administered 2011-03-15 – 2011-03-17 (×2): 2 via RESPIRATORY_TRACT
  Filled 2011-03-14: qty 6.7

## 2011-03-14 MED ORDER — MAGNESIUM HYDROXIDE 400 MG/5ML PO SUSP: 30.0000 mL | Freq: Every day | ORAL | Status: DC | PRN

## 2011-03-14 MED ORDER — RISPERIDONE 1 MG PO TABS
2.0000 mg | ORAL_TABLET | Freq: Two times a day (BID) | ORAL | Status: DC
  Administered 2011-03-14 – 2011-03-17 (×6): 2 mg via ORAL
  Filled 2011-03-14 (×2): qty 2
  Filled 2011-03-14: qty 4
  Filled 2011-03-14 (×3): qty 2
  Filled 2011-03-14: qty 4
  Filled 2011-03-14 (×7): qty 2

## 2011-03-14 MED ORDER — RISPERIDONE 1 MG PO TABS: 1.0000 mg | ORAL_TABLET | Freq: Four times a day (QID) | ORAL | Status: DC | PRN

## 2011-03-14 MED ORDER — ACETAMINOPHEN 325 MG PO TABS
650.0000 mg | ORAL_TABLET | Freq: Four times a day (QID) | ORAL | Status: DC | PRN
  Administered 2011-03-15: 650 mg via ORAL

## 2011-03-14 MED ORDER — MIRTAZAPINE 15 MG PO TABS
15.0000 mg | ORAL_TABLET | Freq: Every day | ORAL | Status: DC
  Administered 2011-03-14 – 2011-03-16 (×3): 15 mg via ORAL
  Filled 2011-03-14: qty 1
  Filled 2011-03-14: qty 2
  Filled 2011-03-14 (×5): qty 1

## 2011-03-14 MED ORDER — DIPHENHYDRAMINE HCL 50 MG/ML IJ SOLN: 50.0000 mg | Freq: Four times a day (QID) | INTRAMUSCULAR | Status: DC | PRN

## 2011-03-14 MED ORDER — IBUPROFEN 400 MG PO TABS: 400.0000 mg | ORAL_TABLET | Freq: Three times a day (TID) | ORAL | Status: DC | PRN

## 2011-03-14 NOTE — Progress Notes (Addendum)
21yo male who presents involuntarily and in no acute distress for Psychotic D/O and anxiety. Appears flat. Mildly anxious but cooperative with assessment. According to pt, he was brought in to have his pacemaker checked by someone he cant remember. States he later found out he was coming for mental health issues. States, "I guess they felt like my religious beliefs were over the top.". States he hasnt taken his medications since he was Baptized 3 weeks ago. States he doesn't need them because Jesus Christ has delivered him from ETOH, smoking and THC and he will deliver him from this. Intermittently laughes inappropriately. Denies SI/HI/AVH and contracts for safety. Does have a scar on his left arm that he says was from an accidental self inflicted cut. Will place on Suicidal care plan r/t mixed h/o of this cut and family history of suicide. See admission assessment for detailed health history. Denies pain or discomfort. Unit policies and expectations reviewed and understanding verbalized. Consents obtained. Belongings searched and no contraband found. Food and fluids offered and refused. Escorted to unit and oriented by Gwenyth Ober, MHT. ALLERGIC TO SHELLFISH. Emergency Contacts: Modena Morrow (mom) [phone]   Marlin Jarrard (twin brother) [phone]

## 2011-03-14 NOTE — ED Provider Notes (Signed)
BP 132/76  Pulse 63  Temp(Src) 97.9 F (36.6 C) (Oral)  Resp 18  Wt 120 lb (54.432 kg)  SpO2 98%  Patient seen and evaluated by me. No complaints at this time.   Forbes Cellar, MD 03/14/11 [phone]

## 2011-03-14 NOTE — ED Notes (Signed)
Care assumed

## 2011-03-14 NOTE — Tx Team (Addendum)
Initial Interdisciplinary Treatment Plan  PATIENT STRENGTHS: (choose at least two) Ability for insight Communication skills General fund of knowledge Motivation for treatment/growth Physical Health Supportive family/friends  PATIENT STRESSORS: Financial difficulties Medication change or noncompliance Occupational concerns   PROBLEM LIST: Problem List/Patient Goals Date to be addressed Date deferred Reason deferred Estimated date of resolution  Anxiety      Psychotic D/O NOS      SI                                           DISCHARGE CRITERIA:  Ability to meet basic life and health needs Adequate post-discharge living arrangements Improved stabilization in mood, thinking, and/or behavior Medical problems require only outpatient monitoring Motivation to continue treatment in a less acute level of care Need for constant or close observation no longer present Reduction of life-threatening or endangering symptoms to within safe limits Safe-care adequate arrangements made Verbal commitment to aftercare and medication compliance  PRELIMINARY DISCHARGE PLAN: Outpatient therapy  PATIENT/FAMIILY INVOLVEMENT: This treatment plan has been presented to and reviewed with the patient, Mario Wyatt.  The patient has been given the opportunity to ask questions and make suggestions.  [name] 03/14/2011, 9:07 PM

## 2011-03-14 NOTE — Progress Notes (Signed)
Psych ED Behavioral Health Group  Co-facilitated group w/ [facility], Chaplain. Group focused on crisis de-escalation and recognizing bodily reactions as triggers. Group was open and engaged w/ sharing among members. Group members discussed circumstances bringing them to psych ED, completed a visual activity where they feel emotion in the body, discussed how they can recognize these bodily reactions prior to escalating, and engaged w/ facilitators in ways they can effectively de-escalate in the moment.  Pt was active and engaged w/ group, described self as calm today and ready to leave hosp so he could start his new job. Pt shared w/ other group members that he uses God/prayer to calm self and create meaning. Pt engaged in visual activity in group and shared that fear usually manifests in shaky feeling throughout his body. Pt stated that walking away from escalating situations and centering self w/ prayer assist him in calming down/de-escalating.  Sanskriti Greenlaw B MS, LPCA, NCC

## 2011-03-15 DIAGNOSIS — F22 Delusional disorders: Secondary | ICD-10-CM

## 2011-03-15 LAB — URINALYSIS, ROUTINE W REFLEX MICROSCOPIC
Leukocytes, UA: NEGATIVE
Nitrite: NEGATIVE
Protein, ur: NEGATIVE mg/dL
Specific Gravity, Urine: 1.029 (ref 1.005–1.030)
Urobilinogen, UA: 1 mg/dL (ref 0.0–1.0)

## 2011-03-15 MED ORDER — POTASSIUM CHLORIDE CRYS ER 20 MEQ PO TBCR
20.0000 meq | EXTENDED_RELEASE_TABLET | Freq: Two times a day (BID) | ORAL | Status: DC
  Administered 2011-03-15: 20 meq via ORAL
  Filled 2011-03-15 (×3): qty 1

## 2011-03-15 MED ORDER — POTASSIUM CHLORIDE CRYS ER 20 MEQ PO TBCR
20.0000 meq | EXTENDED_RELEASE_TABLET | Freq: Two times a day (BID) | ORAL | Status: AC
  Administered 2011-03-15 – 2011-03-17 (×4): 20 meq via ORAL
  Filled 2011-03-15 (×4): qty 1

## 2011-03-15 NOTE — Progress Notes (Addendum)
Pt is guarded this morning and states that he was baptized three weeks ago and stopped taking medication. Offered support and talked with pt about importance of medication and how it can work with religion. Pt took medication and has no physical complaints. He denies si, hi and voices. Safety maintained on unit.  Order placed on night shift for spiritual care consult. Called pastoral care and left message on day shift for spiritual care consult.
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
## 2011-03-15 NOTE — BHH Suicide Risk Assessment (Signed)
Suicide Risk Assessment  Admission Assessment     Demographic factors:  Assessment Details Time of Assessment: Admission Information Obtained From: Patient Current Mental Status:  Current Mental Status:  (Flatly denies) Loss Factors:  Loss Factors: Financial problems / change in socioeconomic status;Loss of significant relationship Historical Factors:  Historical Factors: Family history of suicide;Family history of mental illness or substance abuse Risk Reduction Factors:  Risk Reduction Factors: Religious beliefs about death;Sense of responsibility to family;Positive social support  CLINICAL FACTORS:   Previous Psychiatric Diagnoses and Treatments Medical Diagnoses and Treatments/Surgeries Delusional Disorder.  COGNITIVE FEATURES THAT CONTRIBUTE TO RISK:  None Noted.  Diagnosis:  Axis I: Delusional Disorder.  The patient was seen today and reports the following:   ADL's: Intact.  Sleep: The patient reports to having some difficulty sleeping last night.  Appetite: The patient reports a decreased appetite today.   Mild>(1-10) >Severe  Hopelessness (1-10): 3  Depression (1-10): 2  Anxiety (1-10): 2-3   Suicidal Ideation: The patient denies any suicidal ideation today.  Plan: No  Intent: No  Means: No   Homicidal Ideation: The patient denies any homicidal ideations today.  Plan: No  Intent: No.  Means: No   General Appearance/Behavior: Appropriate and cooperative today.  Eye Contact: Good.  Speech: Appropriate in rate and volume today with no pressuring noted.  Motor Behavior: wnl.  Level of Consciousness: Alert and Oriented x 3.  Mental Status: Alert and Oriented x 3.  Mood: Moderately depressed.  Affect: Moderately constricted.  Anxiety Level: Mild anxiety reported.  Thought Process: wnl.  Thought Content: The patient denies any auditory or visual hallucinations today as well as any delusional thinking.  Perception:. wnl.  Judgment: Fair to Good.  Insight: Fair  to Good.  Cognition: Oriented to person, place and time.  Lab Results: No results found for this or any previous visit (from the past 48 hour(s)).   The patient was seen today and reports to coming to the hospital after becoming non-compliant with this medications.  He states that he was recently baptized and began to have a lot of religious preoccupation and some paranoid delusions.  However, the patient now denies these symptoms but does report some residual depression and anxiety.  He states that he lives in a homeless shelter and is able to remain there until the end of the month.  He also reports that he is in the process of appealing a disability denial.  Treatment Plan Summary:  1. Daily contact with patient to assess and evaluate symptoms and progress in treatment  2. Medication management  3. The patient will deny suicidal ideations or homicidal ideations for 48 hours prior to discharge and have a depression and anxiety rating of 3 or less. The patient will also deny any auditory or visual hallucinations or delusional thinking.   Plan:  1. Will continue current medications.  2. Laboratory studies reviewed.  3. Will continue to monitor.   SUICIDE RISK:   Minimal: No identifiable suicidal ideation.  Patients presenting with no risk factors but with morbid ruminations; may be classified as minimal risk based on the severity of the depressive symptoms  Mario Wyatt 03/15/2011, 2:14 PM
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
## 2011-03-15 NOTE — Tx Team (Signed)
Interdisciplinary Treatment Plan Update (Adult)  Date:  03/15/2011  Time Reviewed:  10:15AM-11:00AM  Progress in Treatment: Attending groups:  Not yet, new Participating in groups: No    Taking medication as prescribed:    Yes Tolerating medication:   Yes Family/Significant other contact made:  No Patient understands diagnosis:   Limited understanding, poor judgment, poor insight Discussing patient identified problems/goals with staff:   Yes Medical problems stabilized or resolved:   Yes Denies suicidal/homicidal ideation:  Denies today Issues/concerns per patient self-inventory:   None Other:    New problem(s) identified: Yes, Describe:  Thinks is on ACT Team, but is not actually  Reason for Continuation of Hospitalization: Anxiety Medication stabilization Other; describe connect with aftercare provider, guardedness, paranoia  Interventions implemented related to continuation of hospitalization:  Medication monitoring and adjustment, safety checks Q15 min., suicide risk assessment, group therapy, psychoeducation, collateral contact, aftercare planning, ongoing physician assessments, medication education  Additional comments:  Not applicable  Estimated length of stay:  3-5 days  Discharge Plan:  Back to [facility]), follow up with [facility], hopefully will be approved for ACTT  New goal(s):  Not applicable  Review of initial/current patient goals per problem list:   1.  Goal(s):  Reduce anxiety from admission level of 10 to no more than 3 at discharge.  Met:  No  Target date:  By Discharge   As evidenced by:  Denies anxiety today (2-3) but needs further observation as this was one of his admitting complaints less than 24 hours ago.  2.  Goal(s):  Deny SI / HI for 48 hours prior to D/C.  Met:  No  Target date:  By Discharge   As evidenced by:   Denies SI today but needs further observation as this was one of  his admitting complaints less than 24 hours ago.  3.  Goal(s):  Reduce any psychosis (AH/VH/paranoia/delusions) to baseline.  Met:  No  Target date:  By Discharge   As evidenced by:   Denies religious preoccupations today but needs further observation as this was one of his admitting complaints less than 24 hours ago.   4.  Goal(s):  Connect with aftercare providers.  Met:  No  Target date:  By Discharge   As evidenced by:  Patient was not admitted to [facility]T and was supposed to follow up with medication management there, but did not  Attendees: Patient:  Mario Wyatt  03/15/2011 10:15AM-11:00AM  Family:     Physician:  Dr. Harvie Heck Readling 03/15/2011 10:15AM-11:00AM  Nursing:   Edwyna Shell, RN 03/15/2011 10:15AM -11:00AM   Case Manager:  Ambrose Mantle, LCSW 03/15/2011 10:15AM-11:00AM  Counselor:     Other:      Other:      Other:      Other:       Scribe for Treatment Team:   Sarina Ser, 03/15/2011, 10:15AM-11:15AM

## 2011-03-15 NOTE — Progress Notes (Signed)
Patient ID: Mario Wyatt, male   DOB: [birth year], 21 y.o.   MRN: [id number] Pt. Reports "you know how how it is when you are first converted, you want to tell everyone about Jesus" "When I got converted I stop smoking, stop taking my medicine, just everything." "I felt so good, this medicine I don't need it, I'll take it I know it's protocol here, but I don't need it." Staff will monitor q75min for safety. Pt. Is safe on the unit.

## 2011-03-15 NOTE — Progress Notes (Addendum)
Patient ID: Mario Wyatt, male   DOB: 01-17-1990, 21 y.o.   MRN: [id number] Pt is asleep in bed this afternoon. Pt affect is flat/anxious and mood is depressed but pt brightens on approach. Pt denies SI/HI and AVH. Pt c/o feeling drowsy and mentally stiff but he is in good sprits. MD notified and states that he will review medications tomorrow. Pt states that this is a learning experience for him. He thought that after he was baptized he would no longer need his medications. Now he sees that his medications are important too. Writer encouraged pt to focus on caring for his mind, body and spirit in order to stay healthy. Pt acknowledges understanding of this concept. Pt is capable of insight and is cooperative with staff. Pt went outside for fresh air to day with unit. Writer will continue to monitor. Pt also c/o itching rash on right thigh. The site is reddened with small raised bumps. NP notified, will examine tomorrow.

## 2011-03-16 DIAGNOSIS — F22 Delusional disorders: Secondary | ICD-10-CM

## 2011-03-16 NOTE — Progress Notes (Signed)
[facility] Group Notes:  (Counselor/Nursing/MHT/Case Management/Adjunct)  03/16/2011 3:07 PM  Type of Therapy:  Group Therapy  Participation Level:  Did Not Attend  Veto Kemps 03/16/2011, 3:07 PM

## 2011-03-16 NOTE — Progress Notes (Signed)
Patient ID: Mario Wyatt, male   DOB: August 13, 1990, 21 y.o.   MRN: [id number] Mario Wyatt presents pleasant on approach, fully alert, appears in full contact with reality,. He is calm cooperative with good eye contact blunted affect. He reports he slept fairly well last night and his appetite is very good. He reports his depression a 1/10 on a 1-10 scale with 10 is the worst symptoms. He rates his anxiety a 1/10, denies any auditory hallucinations. He denies any suicidal thoughts, denies any homicidal thoughts.  He reports that prior to admission he had stopped his medications because Christ as delivering him from cigarettes and everything else. He asks if I'm a [place]. Says that he is homeless and has been living at the shelter, but he hasn't identical twin brother that he used to live with and is not sure he can return there. Says he has an older brother the keeps track of him and brought him to the emergency room.   He has no concerns about medications and feels that they're working well. He expresses his intent to take his medications after he is discharged.   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 I have spoken with Dr. Orvan Falconer of our infectious disease service today. 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  I also contacted the state Department of infectious disease at area code [phone] and they have noted that Mario Wyatt is not registered with a history of syphilis in her system.  Plan: Continue current plan, continue current medications.
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
## 2011-03-16 NOTE — Progress Notes (Signed)
Pt has been up and has been visible in milieu today, did ask when he may be discharged as he thought he may be able to leave tomorrow, pt did speak about being baptized recently and also spoke about wanting to read the bible and spread the word, pt did talk about stopping his medications but also stated that he feels he needs them, pt did receive all medications without incident, support provided, will continue to monitor

## 2011-03-16 NOTE — Progress Notes (Signed)
Pt is quiet with minimal interaction. He reported a rash on the inside of his thighs. Reported to NP for evaluation. Pulse low this morning. Reported to NP. Offered support and 15 minute checks. Pt denies si and hi. Safety maintained on unit.

## 2011-03-16 NOTE — Progress Notes (Signed)
Report received from Mario Mulligan RN.  Writer entered patients room and patient was lying in bed asleep with eyes closed, resp. even and unlabored, no signs of distress noted. Safety maintained on unit, will continue to monitor.

## 2011-03-17 MED ORDER — MIRTAZAPINE 15 MG PO TABS: 15.0000 mg | ORAL_TABLET | Freq: Every day | ORAL | Status: DC

## 2011-03-17 MED ORDER — RISPERIDONE 2 MG PO TABS: 2.0000 mg | ORAL_TABLET | Freq: Two times a day (BID) | ORAL | Status: DC

## 2011-03-17 NOTE — Progress Notes (Signed)
Pt. accepted information on suicide prevention, warning signs to look for with suicide and crisis line numbers to use. The pt. agreed to call crisis line numbers if having warning signs or having thoughts of suicide.  Contact attempt was made to give pt.'s twin brother Alfonza Toft [phone] the suicide information on 03/17/11 at 10:03 AM therapist was unable to leave a message.

## 2011-03-17 NOTE — Progress Notes (Signed)
Patient ID: Mario Wyatt, male   DOB: 30-Jan-1990, 21 y.o.   MRN: [id number]   [facility] Group Notes:  (Counselor/Nursing/MHT/Case Management/Adjunct)  03/17/2011 11 AM  Type of Therapy:  Group Therapy, Dance/Movement Therapy   Participation Level:  Active  Participation Quality:  Appropriate  Affect:  Appropriate  Cognitive:  Oriented  Insight:  Good  Engagement in Group:  Good  Engagement in Therapy:  Good  Modes of Intervention:  Clarification, Problem-solving, Role-play, Socialization and Support  Summary of Progress/Problems: Group practiced healthy interpersonal and intrapersonal communication through movement. Pt fully participated in the group process and was attentive to his physical limitations due to back pain; pt would sit down but still remain engaged. Pt threw his arms up in the air, shaking his hands and stated that he felt accomplished and ready for D/C. Pt indicated a healthy awareness of his needs and that he has the coping skills to be assertive in his recovery.   Thomasena Edis, [employer]

## 2011-03-17 NOTE — [facility] Suicide Risk Assessment (Signed)
Suicide Risk Assessment  Discharge Assessment     Demographic factors:  Male;Adolescent or young adult;Low socioeconomic status (homeless)    Current Mental Status Per Nursing Assessment::   On Admission:   (Flatly denies) At Discharge:   no si, no hi, no avh currently  Current Mental Status Per Physician:  Loss Factors: Financial problems / change in socioeconomic status;Loss of significant relationship  Historical Factors: Family history of suicide;Family history of mental illness or substance abuse  Risk Reduction Factors:   Religious beliefs about death;Positive social support  Continued Clinical Symptoms:  Schizophrenia:   Depressive state  Discharge Diagnoses:  Axis Diagnosis:   AXIS I:  Delusional Disorder.  AXIS II:  Deferred AXIS III:   Past Medical History  Diagnosis Date  . Asthma   . Attention deficit hyperactivity disorder (ADHD)   . Anxiety   . Pacemaker     Placed when pt was 21 years old.   AXIS IV:  housing problems AXIS V:  61-70 mild symptoms  Cognitive Features That Contribute To Risk:  Loss of executive function    Suicide Risk:  Minimal: No identifiable suicidal ideation.  Patients presenting with no risk factors but with morbid ruminations; may be classified as minimal risk based on the severity of the depressive symptoms  Plan Of Care/Follow-up recommendations:    Pt stated that he feels ready to be D/C and needs a bus ticket, pt reports he is familiar with the bus system. Pt plans to go to the [facility] and has been in contact with them and they know he is coming. Pt denies any and all S/I and or H/I. Pt requested a note with his dates of stay. Pt was also given local mental heath support group information. ACT team is going to follow him. He has their contact information and knows how to contact them. He plans to inform them after his discharge.   Wonda Cerise 03/17/2011, 11:26 AM

## 2011-03-17 NOTE — Progress Notes (Signed)
[facility] Case Management Discharge Plan:  Will you be returning to the same living situation after discharge: Yes,  [facility] At discharge, do you have transportation home?:Yes,  pt was given a bus ticket Do you have the ability to pay for your medications:Yes,  gets support with ACTT and CST  Interagency Information:     Release of information consent forms completed and in the chart;  Patient's signature needed at discharge.  Patient to Follow up at:  Follow-up Information    Follow up with [facility]. (Will follow up with you next week at the shelter to get approval for ACTT,)    Contact information:   [address] [place], [place] [address] [[phone]         Patient denies SI/HI:   Yes,      Safety Planning and Suicide Prevention discussed:  Yes,  with pt therapsit made one attempt to call brother  Barrier to discharge identified:No.  Summary and Recommendations:Pt was cleared for D/C and denies any and all S/I and H/I.     Gevena Mart 03/17/2011, 10:07 AM

## 2011-03-17 NOTE — Progress Notes (Signed)
Pt is calm and quiet but does have an anxious expression on his face  He has mild religious preoccupations but is able to refocus on other things when interacting with others  He denies suicidal or homicidal ideation and auditory and visual hallucinations  He expresses knowledge of need for medications and takes his medications without difficulty   He tends to isolate and has minimal interaction with others but his interactions are appropriate  His appetite is good  He reports not sleeping well last night and was encouraged to speak with the doctor about same  Verbal support given  Medications administered and effectiveness monitored  Q 15 min checks  Pt safe at present

## 2011-03-17 NOTE — Progress Notes (Signed)
Pt was discharged   He left with all belongings, follow information, sample medications and prescriptions   Med education and follow up instructions given and acknowledged by the patient   Pt denies suicidal and homicidal ideation  And he denies auditory and visual hallucinations presently   Pt received a two day supply of medications  And a bus pass

## 2011-03-17 NOTE — Progress Notes (Signed)
Patient ID: Mario Wyatt, male   DOB: [birth year], 21 y.o.   MRN: [id number]  This writer met briefly with the patient to review his discharge plans. He reports that he is returning to the shelter, which closes at the end of this month. He states he has a [employer] claim in, and hopes that with that he will be financially stable. He reports that he is going to need another medication to last him until Monday at which time he can get by [facility] and get his prescriptions filled. He also requested a bus pass. He endorses good sleep and appetite.  He denies any suicidal or homicidal ideation; he denies any auditory or visual hallucinations.  We will provide him with 2 days of Remeron and Risperdal. Also we will obtain a bus pass. He will discharge after lunch.

## 2011-03-17 NOTE — Progress Notes (Signed)
Patient ID: Mario Wyatt, male   DOB: August 08, 1990, 21 y.o.   MRN: [id number]  Pt. attended and participated in aftercare planning group. Pt denied S/I and H/I. Pt. accepted information on suicide prevention, warning signs to look for with suicide and crisis line numbers to use. The pt. agreed to call crisis line numbers if having warning signs or having thoughts of suicide. Pt. Shared that he was feeling good today.

## 2011-03-17 NOTE — Progress Notes (Signed)
Spoke to pt about D/C today. Pt stated that he feels ready to be D/C and needs a bus ticket, pt reports he is familiar with the bus system. Pt plans to go to the [facility] and has been in contact with them and they know he is coming. Pt denies any and all S/I and or H/I. Pt requested a note with his dates of stay. Pt was also given local mental heath support group information.

## 2011-03-19 NOTE — Discharge Summary (Signed)
Physician Discharge Summary Note  Patient:  Mario Wyatt is an 21 y.o., male MRN:  [id number] DOB:  [birth year] Patient phone:  [phone] (home)  Patient address:   [address] [place] [address],   Date of Admission:  03/14/2011 Date of Discharge: 03/17/2011  Discharge Diagnoses: Principal Problem:  *Delusional disorder   Axis Diagnosis:   AXIS I:  Delusional Disorder AXIS II:  No diagnosis AXIS III:  No diagnosis Past Medical History  Diagnosis Date  . Asthma   . Attention deficit hyperactivity disorder (ADHD)   . Anxiety   . Pacemaker     Placed when pt was 21 years old.   AXIS IV:  deferred AXIS V:  61-70 mild symptoms  Level of Care:  OP  Hospital Course:  This is a 21 year old male, admitted to [facility] from the [facility] ED with complaints of hyper-religiosity, paranoid and delusional behavior. He reports he got baptized one month previously, stopped all of his medications because he felt he didn't need them.  He was admitted to the unit and presented calm, cooperative, and in rather reclusive. At no time did he express any suicidal thoughts. He was started back on Risperdal 2 mg each bedtime which he tolerated well. And we continued Remeron had taken previously. He was requesting to be discharged to outpatient treatment.  Since he had no suicidal thoughts, and hyperreligiosity had abated, he was deemed safe for discharge on Saturday the 16th. He will pursue outpatient treatment.   Consults:  None  Significant Diagnostic Studies:  UDS negative for all substances. 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 A phone consultation was obtained from Dr. Orvan Falconer of Infectious Disease service who felt this was a false positive RPR and treatment not required.  This was consistent with the patient's report that he had previously been told he had a positive RPR that did not require treatment. We contacted state dept of public health who noted this  patient is not on the syphillis registry.   Discharge Vitals:   Blood pressure 101/66, pulse 120, temperature 97.9 F (36.6 C), temperature source Oral, resp. rate 20, height 5\' 2"  (1.575 m), weight 53.071 kg (117 lb).  Mental Status Exam: See Mental Status Examination and Suicide Risk Assessment completed by Attending Physician prior to discharge.  Discharge destination:  Home  Is patient on multiple antipsychotic therapies at discharge:  No   Has Patient had three or more failed trials of antipsychotic monotherapy by history:  No  Recommended Plan for Multiple Antipsychotic Therapies: n/a   Medication List  As of 03/19/2011  2:18 PM   STOP taking these medications         methylphenidate 36 MG CR tablet      sertraline 50 MG tablet         TAKE these medications      Indication    albuterol 108 (90 BASE) MCG/ACT inhaler   Commonly known as: PROVENTIL HFA;VENTOLIN HFA   Inhale 2 puffs into the lungs every 6 (six) hours as needed. For shortness of breath.       beclomethasone 80 MCG/ACT inhaler   Commonly known as: QVAR   Inhale 2 puffs into the lungs 2 (two) times daily. For asthma       mirtazapine 15 MG tablet   Commonly known as: REMERON   Take 1 tablet (15 mg total) by mouth at bedtime. For sleep and depression       risperiDONE 2  MG tablet   Commonly known as: RISPERDAL   Take 1 tablet (2 mg total) by mouth 2 (two) times daily, for clear thoughts.               Follow-up Information    Follow up with [facility]. (Will follow up with you next week at the shelter to get approval for ACTT,)    Contact information:   [address] [place], [place] [address] [[phone]         Follow-up recommendations:  Activity:  unrestricted Diet:  regular  Signed: Nayali Talerico A 03/19/2011, 2:18 PM

## 2011-03-21 DIAGNOSIS — I441 Atrioventricular block, second degree: Secondary | ICD-10-CM | POA: Insufficient documentation

## 2011-03-21 DIAGNOSIS — Z950 Presence of cardiac pacemaker: Secondary | ICD-10-CM | POA: Insufficient documentation

## 2011-03-22 NOTE — Progress Notes (Signed)
Patient Discharge Instructions:  Psychiatric Admission Assessment Note Provided,  03/22/2011 Discharge Summary Note Provided,   03/22/2011 After Visit Summary (AVS) Provided,  03/22/2011 Face Sheet Provided, 03/22/2011 Faxed/Sent to the Next Level Care provider:  03/22/2011  Faxed to [facility] ACTT @ [phone]  Wandra Scot, 03/22/2011, 12:16 PM

## 2011-09-08 ENCOUNTER — Emergency Department (HOSPITAL_BASED_OUTPATIENT_CLINIC_OR_DEPARTMENT_OTHER)
Admission: EM | Admit: 2011-09-08 | Discharge: 2011-09-08 | Disposition: A | Discharge status: Home / Self Care | Payer: Medicaid Other | Attending: Emergency Medicine | Admitting: Emergency Medicine

## 2011-09-08 DIAGNOSIS — Z950 Presence of cardiac pacemaker: Secondary | ICD-10-CM | POA: Insufficient documentation

## 2011-09-08 DIAGNOSIS — F909 Attention-deficit hyperactivity disorder, unspecified type: Secondary | ICD-10-CM | POA: Insufficient documentation

## 2011-09-08 DIAGNOSIS — Z87891 Personal history of nicotine dependence: Secondary | ICD-10-CM | POA: Insufficient documentation

## 2011-09-08 DIAGNOSIS — W268XXA Contact with other sharp object(s), not elsewhere classified, initial encounter: Secondary | ICD-10-CM | POA: Insufficient documentation

## 2011-09-08 DIAGNOSIS — S61309A Unspecified open wound of unspecified finger with damage to nail, initial encounter: Secondary | ICD-10-CM

## 2011-09-08 DIAGNOSIS — Z91013 Allergy to seafood: Secondary | ICD-10-CM | POA: Insufficient documentation

## 2011-09-08 DIAGNOSIS — J45909 Unspecified asthma, uncomplicated: Secondary | ICD-10-CM | POA: Insufficient documentation

## 2011-09-08 DIAGNOSIS — F411 Generalized anxiety disorder: Secondary | ICD-10-CM | POA: Insufficient documentation

## 2011-09-08 DIAGNOSIS — S61209A Unspecified open wound of unspecified finger without damage to nail, initial encounter: Secondary | ICD-10-CM | POA: Insufficient documentation

## 2011-09-08 MED ORDER — CEPHALEXIN 250 MG PO CAPS
500.0000 mg | ORAL_CAPSULE | Freq: Once | ORAL | Status: AC
  Administered 2011-09-08: 500 mg via ORAL
  Filled 2011-09-08: qty 2

## 2011-09-08 MED ORDER — CEPHALEXIN 500 MG PO CAPS: 500.0000 mg | ORAL_CAPSULE | Freq: Four times a day (QID) | ORAL | Status: AC

## 2011-09-08 NOTE — ED Provider Notes (Signed)
History   This chart was scribed for Mario Seamen, MD scribed by Magnus Sinning. The patient was seen in room MH04/MH04 seen at 22:22   CSN: [id number]  Arrival date & time 09/08/11  2208   None     Chief Complaint  Patient presents with  . Finger Injury    (Consider location/radiation/quality/duration/timing/severity/associated sxs/prior treatment) The history is provided by the patient. No language interpreter was used.   Mario Wyatt is a 21 y.o. 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 Patient states he cut his finger on a tomato slicer that was located at the bottom of the sink. He notes it was clean and says his tetanus is UTD.   Past Medical History  Diagnosis Date  . Asthma   . Attention deficit hyperactivity disorder (ADHD)   . Anxiety   . Pacemaker     Placed when pt was 21 years old.    Past Surgical History  Procedure Date  . Cardiac pacemaker placement   . 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 Hernia repair     Family History  Problem Relation Age of Onset  . Anesthesia problems Neg Hx   . Hypotension Neg Hx   . Malignant hyperthermia Neg Hx   . Pseudochol deficiency Neg Hx     History  Substance Use Topics  . Smoking status: Former Smoker -- 1.0 packs/day    Types: Cigarettes    Quit date: 02/24/2011  . Smokeless tobacco: Never Used  . Alcohol Use: No      Review of Systems 10 Systems reviewed and are negative for acute change except as noted in the HPI. Allergies  Shellfish allergy  Home Medications   Current Outpatient Rx  Name Route Sig Dispense Refill  . ALBUTEROL SULFATE HFA 108 (90 BASE) MCG/ACT IN AERS Inhalation Inhale 2 puffs into the lungs every 6 (six) hours as needed. For shortness of breath.    . BECLOMETHASONE DIPROPIONATE 80 MCG/ACT IN AERS Inhalation Inhale 2 puffs into the lungs 2 (two)  times daily.    . CEPHALEXIN 500 MG PO CAPS Oral Take 1 capsule (500 mg total) by mouth 4 (four) times daily. 20 capsule 0  . MIRTAZAPINE 15 MG PO TABS Oral Take 1 tablet (15 mg total) by mouth at bedtime. 30 tablet 0    BP 119/68  Pulse 69  Temp 98.2 F (36.8 C) (Oral)  Resp 16  SpO2 100%  Physical Exam  Nursing note and vitals reviewed. Constitutional: He is oriented to person, place, and time. He appears well-developed and well-nourished. No distress.  HENT:  Head: Normocephalic and atraumatic.  Eyes: Conjunctivae and EOM are normal. Pupils are equal, round, and reactive to light.  Neck: Neck supple. No tracheal deviation present.  Cardiovascular: Normal rate.   Pulmonary/Chest: Effort normal. No respiratory distress.  Abdominal: Soft. He exhibits no distension.  Musculoskeletal: Normal range of motion. He exhibits no edema.       Avulsion of the left 4th finger nail with loss of partial thickness of the soft tissue underlying the nail. No involvement of the nail fold and the edges of the nail are intact.   Neurological: He is alert and oriented to person, place, and time. No sensory deficit.  Skin: Skin is warm and dry.  Psychiatric: He has a normal mood and affect. His  behavior is normal.    ED Course  Procedures (including critical care time) DIAGNOSTIC STUDIES: Oxygen Saturation is 100% on room air, normal by my interpretation.    COORDINATION OF CARE:  Procedures Primary closure not indicated   22:24: Physical exam performed. Provided intent to d/c home with a abx.   1. Partial avulsion of fingernail       MDM  I personally performed the services described in this documentation, which was scribed in my presence.  The recorded information has been reviewed and considered.          Mario Seamen, MD 09/08/11 2234

## 2011-09-08 NOTE — ED Notes (Signed)
Pt cut his left ring finger while washing dishes at work. Bandaged by the ems.

## 2012-01-13 IMAGING — CR DG CHEST 2V
2 series · 2 of 2 positions shown · non-contrast
Comparison: None.

CLINICAL DATA: Chest pain and lethargy.

CHEST - 2 VIEW

[w chest pa]
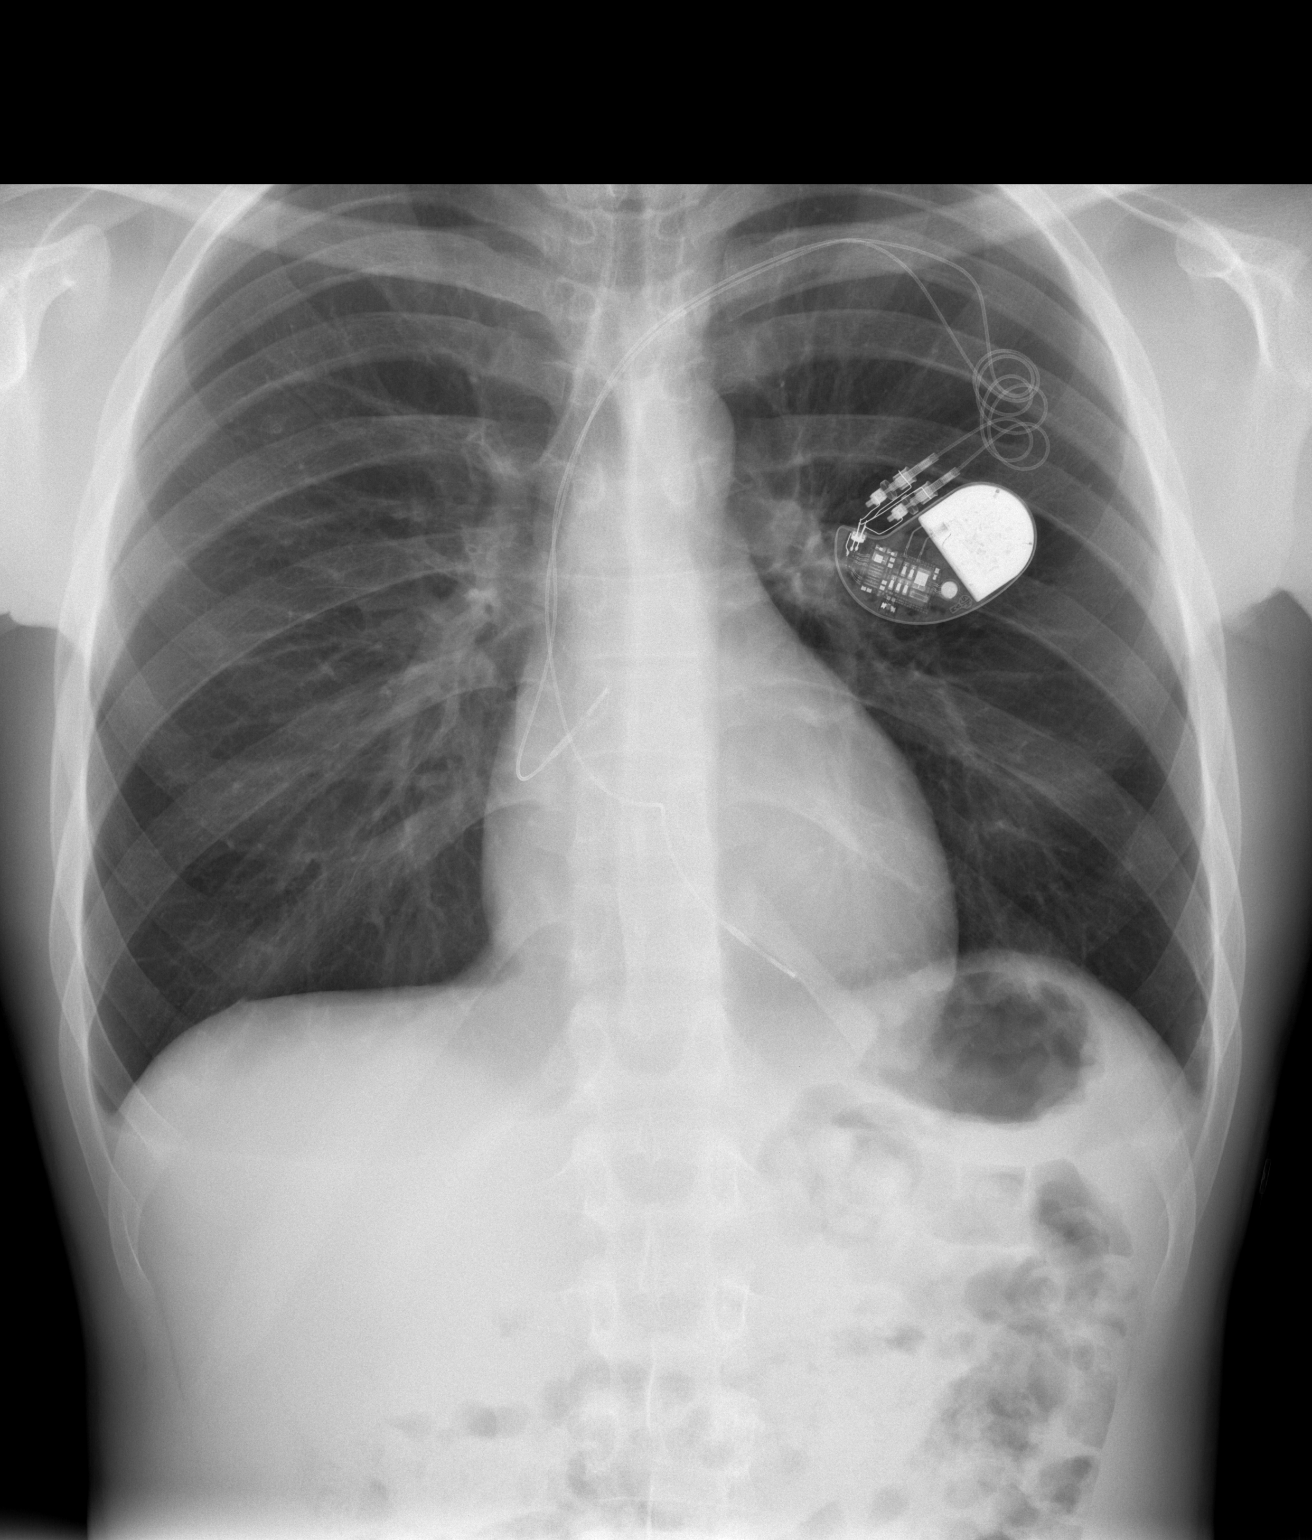

[w chest lat]
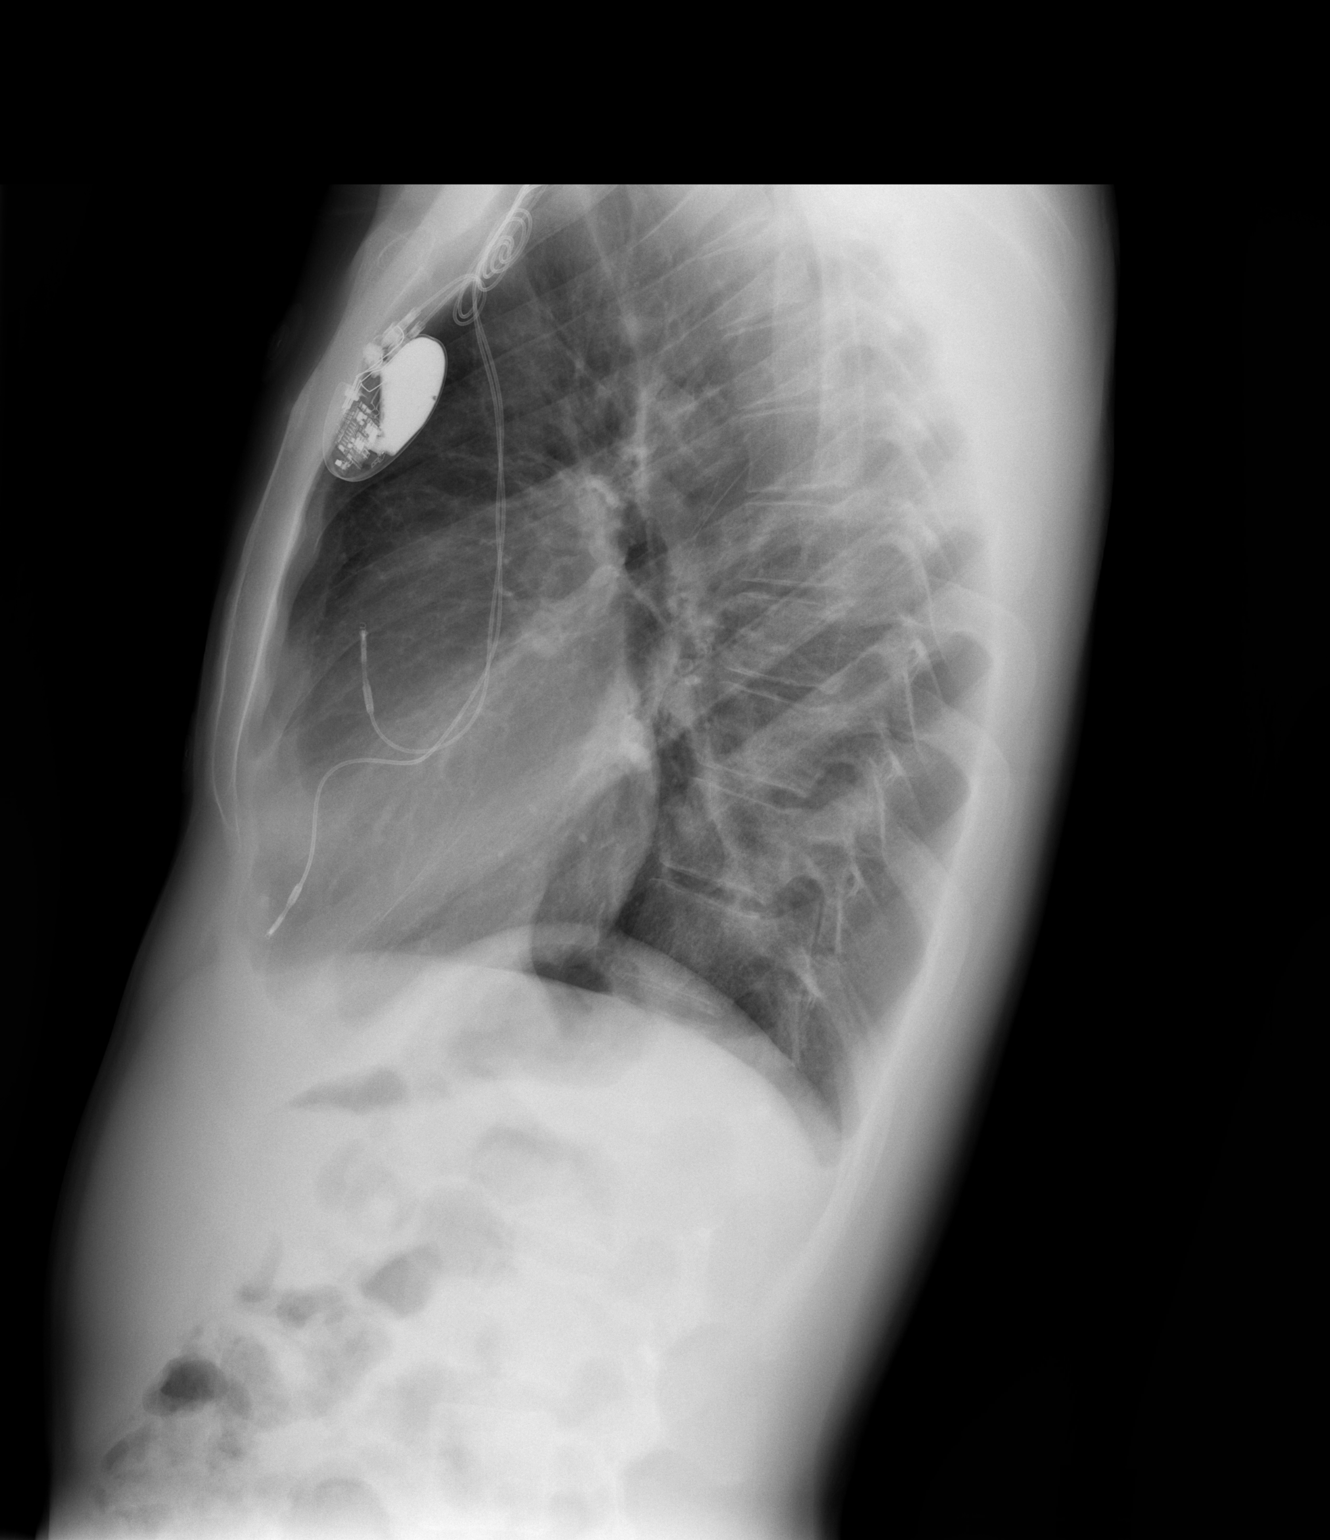

[2 of 2 positions shown; findings below may reference images not displayed]

FINDINGS: Lungs are clear.  No pneumothorax pleural effusion.
Heart size normal.  Pacing device is in place.
IMPRESSION: No acute finding.

## 2012-08-15 ENCOUNTER — Emergency Department (INDEPENDENT_AMBULATORY_CARE_PROVIDER_SITE_OTHER)
Admission: EM | Admit: 2012-08-15 | Discharge: 2012-08-15 | Disposition: A | Discharge status: Home / Self Care | Payer: Medicaid Other | Source: Home / Self Care

## 2012-08-15 ENCOUNTER — Encounter (HOSPITAL_COMMUNITY): Payer: Self-pay | Admitting: Emergency Medicine

## 2012-08-15 ENCOUNTER — Emergency Department (INDEPENDENT_AMBULATORY_CARE_PROVIDER_SITE_OTHER): Payer: PRIVATE HEALTH INSURANCE

## 2012-08-15 DIAGNOSIS — K089 Disorder of teeth and supporting structures, unspecified: Secondary | ICD-10-CM

## 2012-08-15 DIAGNOSIS — K0889 Other specified disorders of teeth and supporting structures: Secondary | ICD-10-CM

## 2012-08-15 MED ORDER — AMOXICILLIN 500 MG PO CAPS: 500.0000 mg | ORAL_CAPSULE | Freq: Three times a day (TID) | ORAL | Status: DC

## 2012-08-15 MED ORDER — MELOXICAM 15 MG PO TABS: 15.0000 mg | ORAL_TABLET | Freq: Every day | ORAL | Status: DC | PRN

## 2012-08-15 NOTE — ED Notes (Signed)
Notified dr Denyse Amass of patient
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
## 2013-01-19 ENCOUNTER — Encounter (HOSPITAL_COMMUNITY): Payer: Self-pay | Admitting: Emergency Medicine

## 2013-01-19 ENCOUNTER — Emergency Department (HOSPITAL_COMMUNITY)
Admission: EM | Admit: 2013-01-19 | Discharge: 2013-01-19 | Disposition: A | Discharge status: Home / Self Care | Payer: Medicaid Other | Source: Home / Self Care | Attending: Family Medicine | Admitting: Family Medicine

## 2013-01-19 DIAGNOSIS — M545 Low back pain, unspecified: Secondary | ICD-10-CM

## 2013-01-19 MED ORDER — TRAMADOL HCL 50 MG PO TABS: 50.0000 mg | ORAL_TABLET | Freq: Four times a day (QID) | ORAL | Status: DC | PRN

## 2013-01-19 MED ORDER — PREDNISONE 10 MG PO KIT: PACK | ORAL | Status: DC

## 2013-01-19 NOTE — Discharge Instructions (Signed)
Thank you for coming in today. Take the prednisone as directed for 12 day Use tramadol for severe pain. Do not drive or work after taking this medication. Followup with your primary care provider if not getting better.  Come back or go to the emergency room if you notice new weakness new numbness problems walking or bowel or bladder problems.  Back Exercises Back exercises help treat and prevent back injuries. The goal is to increase your strength in your belly (abdominal) and back muscles. These exercises can also help with flexibility. Start these exercises when told by your doctor. HOME CARE Back exercises include: Pelvic Tilt.  Lie on your back with your knees bent. Tilt your pelvis until the lower part of your back is against the floor. Hold this position 5 to 10 sec. Repeat this exercise 5 to 10 times. Knee to Chest.  Pull 1 knee up against your chest and hold for 20 to 30 seconds. Repeat this with the other knee. This may be done with the other leg straight or bent, whichever feels better. Then, pull both knees up against your chest. Sit-Ups or Curl-Ups.  Bend your knees 90 degrees. Start with tilting your pelvis, and do a partial, slow sit-up. Only lift your upper half 30 to 45 degrees off the floor. Take at least 2 to 3 seonds for each sit-up. Do not do sit-ups with your knees out straight. If partial sit-ups are difficult, simply do the above but with only tightening your belly (abdominal) muscles and holding it as told. Hip-Lift.  Lie on your back with your knees flexed 90 degrees. Push down with your feet and shoulders as you raise your hips 2 inches off the floor. Hold for 10 seconds, repeat 5 to 10 times. Back Arches.  Lie on your stomach. Prop yourself up on bent elbows. Slowly press on your hands, causing an arch in your low back. Repeat 3 to 5 times. Shoulder-Lifts.  Lie face down with arms beside your body. Keep hips and belly pressed to floor as you slowly lift your head  and shoulders off the floor. Do not overdo your exercises. Be careful in the beginning. Exercises may cause you some mild back discomfort. If the pain lasts for more than 15 minutes, stop the exercises until you see your doctor. Improvement with exercise for back problems is slow.  Document Released: 01/20/2010 Document Revised: 03/12/2011 Document Reviewed: 10/19/2010 [facility] Patient Information 2014 [place], [place].

## 2013-01-19 NOTE — ED Provider Notes (Signed)
Mario Wyatt is a 23 y.o. male who presents to Urgent Care today for back pain. Patient developed back pain over the last few weeks. The pain has been intermittent. He has tried over-the-counter pain medications are marginally effective. He denies any significant radiating pain weakness or numbness. Additionally over the past several weeks he has had off-and-on multiple other complaints. He was seen by another urgent care who did STD testing which was negative. They gave him some type of pain shot several days ago which worked temporarily.  He denies any weakness or numbness bowel bladder dysfunction pain or difficulty walking. He denies any vomiting chest pain palpitations or trouble breathing. He has occasional diarrhea..    Past Medical History  Diagnosis Date  . Asthma   . Attention deficit hyperactivity disorder (ADHD)   . Anxiety   . Pacemaker     Placed when pt was 23 years old.   History  Substance Use Topics  . Smoking status: Current Every Day Smoker -- 1.00 packs/day    Types: Cigarettes    Last Attempt to Quit: 02/24/2011  . Smokeless tobacco: Never Used  . Alcohol Use: Yes     Comment: socially   ROS as above Medications: No current facility-administered medications for this encounter.   Current Outpatient Prescriptions  Medication Sig Dispense Refill  . albuterol (PROVENTIL HFA;VENTOLIN HFA) 108 (90 BASE) MCG/ACT inhaler Inhale 2 puffs into the lungs every 6 (six) hours as needed. For shortness of breath.      . beclomethasone (QVAR) 80 MCG/ACT inhaler Inhale 2 puffs into the lungs 2 (two) times daily.      . mirtazapine (REMERON) 15 MG tablet Take 1 tablet (15 mg total) by mouth at bedtime.  30 tablet  0  . PredniSONE 10 MG KIT 12 day dose pack po  1 kit  0  . traMADol (ULTRAM) 50 MG tablet Take 1 tablet (50 mg total) by mouth every 6 (six) hours as needed.  15 tablet  0  . [DISCONTINUED] ALBUTEROL IN Inhale into the lungs as needed.        . [DISCONTINUED]  methylphenidate (CONCERTA) 36 MG CR tablet Take 36 mg by mouth every morning.        . [DISCONTINUED] sertraline (ZOLOFT) 50 MG tablet Take 50 mg by mouth daily.          Exam:  BP 123/62  Pulse 64  Temp(Src) 97.6 F (36.4 C) (Oral)  Resp 16  SpO2 100% Gen: Well NAD HEENT: EOMI,  MMM Lungs: Normal work of breathing. CTABL Heart: RRR no MRG Abd: NABS, Soft. NT, ND no masses palpated no rebound or guarding Exts: Brisk capillary refill, warm and well perfused.  Back: Nontender to spinal midline. Tender palpation bilateral SI joint. Negative straight leg raise test and Faber test bilaterally. Strength is intact throughout bilateral lower extremities. Patient and get on and off exam table by himself stand on his toes and heels and squat normally. He has a normal gait. Sensation is intact throughout bilateral lower extremities.  Assessment and Plan: 23 y.o. male with  Lumbago. Patient has myofascial dysfunction of his low back. Plan to treat with prednisone and tramadol. Will followup with primary care provider if not better. Additionally patient will see his primary care provider for his other complaints as he would like to focus on his back pain today.   Discussed warning signs or symptoms. Please see discharge instructions. Patient expresses understanding.    Gregor Hams, MD 01/19/13  2011

## 2013-01-19 NOTE — ED Notes (Signed)
C/o numbness aching pain all over his body, chills, sharp pains in his back, stomach pain dry cough, and diarrhea onset 2 weeks ago.

## 2013-03-28 ENCOUNTER — Encounter (HOSPITAL_COMMUNITY): Payer: Self-pay | Admitting: Emergency Medicine

## 2013-03-28 ENCOUNTER — Emergency Department (HOSPITAL_COMMUNITY)
Admission: EM | Admit: 2013-03-28 | Discharge: 2013-03-28 | Disposition: A | Discharge status: Home / Self Care | Payer: Medicaid Other | Attending: Emergency Medicine | Admitting: Emergency Medicine

## 2013-03-28 DIAGNOSIS — Y929 Unspecified place or not applicable: Secondary | ICD-10-CM | POA: Insufficient documentation

## 2013-03-28 DIAGNOSIS — Z79899 Other long term (current) drug therapy: Secondary | ICD-10-CM | POA: Insufficient documentation

## 2013-03-28 DIAGNOSIS — W268XXA Contact with other sharp object(s), not elsewhere classified, initial encounter: Secondary | ICD-10-CM | POA: Insufficient documentation

## 2013-03-28 DIAGNOSIS — Y9301 Activity, walking, marching and hiking: Secondary | ICD-10-CM | POA: Insufficient documentation

## 2013-03-28 DIAGNOSIS — Z950 Presence of cardiac pacemaker: Secondary | ICD-10-CM | POA: Insufficient documentation

## 2013-03-28 DIAGNOSIS — F411 Generalized anxiety disorder: Secondary | ICD-10-CM | POA: Insufficient documentation

## 2013-03-28 DIAGNOSIS — S61219A Laceration without foreign body of unspecified finger without damage to nail, initial encounter: Secondary | ICD-10-CM

## 2013-03-28 DIAGNOSIS — F17200 Nicotine dependence, unspecified, uncomplicated: Secondary | ICD-10-CM | POA: Insufficient documentation

## 2013-03-28 DIAGNOSIS — J45909 Unspecified asthma, uncomplicated: Secondary | ICD-10-CM | POA: Insufficient documentation

## 2013-03-28 DIAGNOSIS — S61209A Unspecified open wound of unspecified finger without damage to nail, initial encounter: Secondary | ICD-10-CM | POA: Insufficient documentation

## 2013-03-28 DIAGNOSIS — IMO0002 Reserved for concepts with insufficient information to code with codable children: Secondary | ICD-10-CM | POA: Insufficient documentation

## 2013-03-28 NOTE — ED Notes (Signed)
Bed: WTR9 Expected date:  Expected time:  Means of arrival:  Comments: EMS 32M finger lac

## 2013-03-28 NOTE — ED Provider Notes (Signed)
CSN: [id number]     Arrival date & time 03/28/13  2152 History  This chart was scribed for non-physician practitioner Hazel Sams, PA working with Kathalene Frames, MD by Mercy Moore, ED Scribe. This patient was seen in room [place] and the patient's care was started at 10:43 PM.   Chief Complaint  Patient presents with  . Finger Injury      The history is provided by the patient. No language interpreter was used.   HPI Comments: Mario Wyatt is a 23 y.o. male who presents to the Emergency Department with small laceration to right fourth finger due to injury that occurred 7 hours ago. Patient works at [employer] and this afternoon he was moving product from cooler to the next. Patient says that he was walking into the second cooler and someone closed the metal cooler door while he was standing in the threshold and a metal piece protruding from the door cut into his hand resulting in a laceration of his right fourth finger. Patient says the laceration began bleeding and he immediately cleaned it and put a Band-Aid on it. However his finger began bleeding and a friend told him that he may need stitches.  Past Medical History  Diagnosis Date  . Asthma   . Attention deficit hyperactivity disorder (ADHD)   . Anxiety   . Pacemaker     Placed when pt was 23 years old.   Past Surgical History  Procedure Laterality Date  . Cardiac pacemaker placement    . Pacemaker insertion  2001    second degree heart block  . Hernia repair     Family History  Problem Relation Age of Onset  . Anesthesia problems Neg Hx   . Hypotension Neg Hx   . Malignant hyperthermia Neg Hx   . Pseudochol deficiency Neg Hx   . Hypertension Mother   . Diabetes Mother   . Bipolar disorder Mother   . Glaucoma Father    History  Substance Use Topics  . Smoking status: Current Every Day Smoker -- 1.00 packs/day    Types: Cigarettes    Last Attempt to Quit: 02/24/2011  . Smokeless tobacco: Never Used  . Alcohol  Use: Yes     Comment: socially    Review of Systems  Skin: Positive for wound.      Allergies  Shellfish allergy  Home Medications   Current Outpatient Rx  Name  Route  Sig  Dispense  Refill  . albuterol (PROVENTIL HFA;VENTOLIN HFA) 108 (90 BASE) MCG/ACT inhaler   Inhalation   Inhale 2 puffs into the lungs every 6 (six) hours as needed. For shortness of breath.         . beclomethasone (QVAR) 80 MCG/ACT inhaler   Inhalation   Inhale 2 puffs into the lungs 2 (two) times daily.         . mirtazapine (REMERON) 15 MG tablet   Oral   Take 1 tablet (15 mg total) by mouth at bedtime.   30 tablet   0   . PredniSONE 10 MG KIT      12 day dose pack po   1 kit   0   . traMADol (ULTRAM) 50 MG tablet   Oral   Take 1 tablet (50 mg total) by mouth every 6 (six) hours as needed.   15 tablet   0    Triage Vitals: BP 140/100  Pulse 60  Temp(Src) 98.7 F (37.1 C) (Oral)  Resp 18  SpO2  100% Physical Exam  Nursing note and vitals reviewed. Constitutional: He is oriented to person, place, and time. He appears well-developed and well-nourished. No distress.  HENT:  Head: Normocephalic and atraumatic.  Eyes: EOM are normal.  Neck: Neck supple. No tracheal deviation present.  Cardiovascular: Normal rate, regular rhythm and normal heart sounds.  Exam reveals no gallop and no friction rub.   No murmur heard. Pacemaker in left chest.   Pulmonary/Chest: Effort normal. No respiratory distress.  Musculoskeletal: Normal range of motion.  Right hand: laceration to ulnar aspect of the right fourth finger just distal to the PIP joint. Full ROM in the fingers. Normal distal sensation to light touch.  Normal cap refill < 2 sec.   Neurological: He is alert and oriented to person, place, and time.  Skin: Skin is warm and dry.  Psychiatric: He has a normal mood and affect. His behavior is normal.    ED Course  Procedures  DIAGNOSTIC STUDIES: Oxygen Saturation is 100% on room air,  normal by my interpretation.    COORDINATION OF CARE: 10:58 PM- Pt advised of plan for treatment and pt agrees.  LACERATION REPAIR Performed by: Hazel Sams, PA  Consent: Verbal consent obtained. Risks and benefits: risks, benefits and alternatives were discussed Patient identity confirmed: provided demographic data Time out performed prior to procedure Prepped and Draped in normal sterile fashion Wound explored Laceration Location: right dorsal fourth finger Laceration Length: 2 cm No Foreign Bodies seen or palpated Anesthesia: local infiltration Local anesthetic: lidocaine 2% without epinephrine Anesthetic total: 1 ml Irrigation method: syringe Amount of cleaning: standard Skin closure: 4-0 vicryl rapid  Number of sutures or staples: 3 Technique: simple interrupted  Patient tolerance: Patient tolerated the procedure well with no immediate complications.     MDM   Final diagnoses:  Laceration of finger    I personally performed the services described in this documentation, which was scribed in my presence. The recorded information has been reviewed and is accurate.    Martie Lee, PA-C 03/28/13 2314

## 2013-03-28 NOTE — Discharge Instructions (Signed)
Followup with your primary care provider for continued evaluation and treatment.   Laceration Care, Adult A laceration is a cut or lesion that goes through all layers of the skin and into the tissue just beneath the skin. TREATMENT  Some lacerations may not require closure. Some lacerations may not be able to be closed due to an increased risk of infection. It is important to see your caregiver as soon as possible after an injury to minimize the risk of infection and maximize the opportunity for successful closure. If closure is appropriate, pain medicines may be given, if needed. The wound will be cleaned to help prevent infection. Your caregiver will use stitches (sutures), staples, wound glue (adhesive), or skin adhesive strips to repair the laceration. These tools bring the skin edges together to allow for faster healing and a better cosmetic outcome. However, all wounds will heal with a scar. Once the wound has healed, scarring can be minimized by covering the wound with sunscreen during the day for 1 full year. HOME CARE INSTRUCTIONS  For sutures or staples:  Keep the wound clean and dry.  If you were given a bandage (dressing), you should change it at least once a day. Also, change the dressing if it becomes wet or dirty, or as directed by your caregiver.  Wash the wound with soap and water 2 times a day. Rinse the wound off with water to remove all soap. Pat the wound dry with a clean towel.  After cleaning, apply a thin layer of the antibiotic ointment as recommended by your caregiver. This will help prevent infection and keep the dressing from sticking.  You may shower as usual after the first 24 hours. Do not soak the wound in water until the sutures are removed.  Only take over-the-counter or prescription medicines for pain, discomfort, or fever as directed by your caregiver.  Get your sutures or staples removed as directed by your caregiver. For skin adhesive strips:  Keep the  wound clean and dry.  Do not get the skin adhesive strips wet. You may bathe carefully, using caution to keep the wound dry.  If the wound gets wet, pat it dry with a clean towel.  Skin adhesive strips will fall off on their own. You may trim the strips as the wound heals. Do not remove skin adhesive strips that are still stuck to the wound. They will fall off in time. For wound adhesive:  You may briefly wet your wound in the shower or bath. Do not soak or scrub the wound. Do not swim. Avoid periods of heavy perspiration until the skin adhesive has fallen off on its own. After showering or bathing, gently pat the wound dry with a clean towel.  Do not apply liquid medicine, cream medicine, or ointment medicine to your wound while the skin adhesive is in place. This may loosen the film before your wound is healed.  If a dressing is placed over the wound, be careful not to apply tape directly over the skin adhesive. This may cause the adhesive to be pulled off before the wound is healed.  Avoid prolonged exposure to sunlight or tanning lamps while the skin adhesive is in place. Exposure to ultraviolet light in the first year will darken the scar.  The skin adhesive will usually remain in place for 5 to 10 days, then naturally fall off the skin. Do not pick at the adhesive film. You may need a tetanus shot if:  You cannot remember when  you had your last tetanus shot.  You have never had a tetanus shot. If you get a tetanus shot, your arm may swell, get red, and feel warm to the touch. This is common and not a problem. If you need a tetanus shot and you choose not to have one, there is a rare chance of getting tetanus. Sickness from tetanus can be serious. SEEK MEDICAL CARE IF:   You have redness, swelling, or increasing pain in the wound.  You see a red line that goes away from the wound.  You have yellowish-white fluid (pus) coming from the wound.  You have a fever.  You notice a bad  smell coming from the wound or dressing.  Your wound breaks open before or after sutures have been removed.  You notice something coming out of the wound such as wood or glass.  Your wound is on your hand or foot and you cannot move a finger or toe. SEEK IMMEDIATE MEDICAL CARE IF:   Your pain is not controlled with prescribed medicine.  You have severe swelling around the wound causing pain and numbness or a change in color in your arm, hand, leg, or foot.  Your wound splits open and starts bleeding.  You have worsening numbness, weakness, or loss of function of any joint around or beyond the wound.  You develop painful lumps near the wound or on the skin anywhere on your body. MAKE SURE YOU:   Understand these instructions.  Will watch your condition.  Will get help right away if you are not doing well or get worse. Document Released: 12/18/2004 Document Revised: 03/12/2011 Document Reviewed: 06/13/2010 [facility] Patient Information 2014 [place], [place].

## 2013-03-28 NOTE — ED Notes (Signed)
Pt has small paper like cut to right ring finger, no bleeding.

## 2013-03-29 NOTE — ED Provider Notes (Signed)
Medical screening examination/treatment/procedure(s) were performed by non-physician practitioner and as supervising physician I was immediately available for consultation/collaboration.    Rhylan Gross R Iysha Mishkin, MD 03/29/13 1812 

## 2013-04-05 ENCOUNTER — Encounter (HOSPITAL_COMMUNITY): Payer: Self-pay | Admitting: Emergency Medicine

## 2013-04-05 ENCOUNTER — Emergency Department (HOSPITAL_COMMUNITY)
Admission: EM | Admit: 2013-04-05 | Discharge: 2013-04-05 | Disposition: A | Discharge status: Home / Self Care | Payer: Medicaid Other | Attending: Emergency Medicine | Admitting: Emergency Medicine

## 2013-04-05 DIAGNOSIS — R1110 Vomiting, unspecified: Secondary | ICD-10-CM | POA: Diagnosis present

## 2013-04-05 DIAGNOSIS — R1084 Generalized abdominal pain: Secondary | ICD-10-CM | POA: Insufficient documentation

## 2013-04-05 DIAGNOSIS — R197 Diarrhea, unspecified: Secondary | ICD-10-CM | POA: Insufficient documentation

## 2013-04-05 DIAGNOSIS — R110 Nausea: Secondary | ICD-10-CM | POA: Diagnosis present

## 2013-04-05 DIAGNOSIS — Z950 Presence of cardiac pacemaker: Secondary | ICD-10-CM | POA: Insufficient documentation

## 2013-04-05 DIAGNOSIS — F17200 Nicotine dependence, unspecified, uncomplicated: Secondary | ICD-10-CM | POA: Insufficient documentation

## 2013-04-05 DIAGNOSIS — J45909 Unspecified asthma, uncomplicated: Secondary | ICD-10-CM | POA: Insufficient documentation

## 2013-04-05 DIAGNOSIS — Z8659 Personal history of other mental and behavioral disorders: Secondary | ICD-10-CM | POA: Insufficient documentation

## 2013-04-05 DIAGNOSIS — R112 Nausea with vomiting, unspecified: Secondary | ICD-10-CM | POA: Insufficient documentation

## 2013-04-05 MED ORDER — ONDANSETRON 4 MG PO TBDP: ORAL_TABLET | ORAL | Status: DC

## 2013-04-05 MED ORDER — ONDANSETRON 4 MG PO TBDP
4.0000 mg | ORAL_TABLET | Freq: Once | ORAL | Status: AC
  Administered 2013-04-05: 4 mg via ORAL
  Filled 2013-04-05: qty 1

## 2013-04-05 MED ORDER — LOPERAMIDE HCL 2 MG PO CAPS
4.0000 mg | ORAL_CAPSULE | Freq: Once | ORAL | Status: AC
  Administered 2013-04-05: 4 mg via ORAL
  Filled 2013-04-05: qty 2

## 2013-04-05 NOTE — ED Provider Notes (Addendum)
CSN: [id number]     Arrival date & time 04/05/13  1355 History   First MD Initiated Contact with Patient 04/05/13 1458     Chief Complaint  Patient presents with  . Nausea  . Emesis  . Diarrhea     (Consider location/radiation/quality/duration/timing/severity/associated sxs/prior Treatment) Patient is a 23 y.o. male presenting with vomiting and diarrhea. The history is provided by the patient.  Emesis Severity:  Mild Timing:  Intermittent Quality:  Stomach contents Able to tolerate:  Liquids Progression:  Unchanged Chronicity:  New Recent urination:  Normal Relieved by:  Nothing Worsened by:  Nothing tried Ineffective treatments:  None tried Associated symptoms: abdominal pain (cramping) and diarrhea   Associated symptoms: no headaches   Abdominal pain:    Location:  Generalized   Quality:  Cramping   Severity:  Mild   Onset quality:  Gradual   Timing:  Intermittent   Progression:  Unchanged   Chronicity:  New Diarrhea:    Quality:  Watery   Severity:  Mild Diarrhea Associated symptoms: abdominal pain (cramping) and vomiting   Associated symptoms: no fever and no headaches     Past Medical History  Diagnosis Date  . Asthma   . Attention deficit hyperactivity disorder (ADHD)   . Anxiety   . Pacemaker     Placed when pt was 23 years old.   Past Surgical History  Procedure Laterality Date  . Cardiac pacemaker placement    . Pacemaker insertion  2001    second degree heart block  . Hernia repair     Family History  Problem Relation Age of Onset  . Anesthesia problems Neg Hx   . Hypotension Neg Hx   . Malignant hyperthermia Neg Hx   . Pseudochol deficiency Neg Hx   . Hypertension Mother   . Diabetes Mother   . Bipolar disorder Mother   . Glaucoma Father    History  Substance Use Topics  . Smoking status: Current Every Day Smoker -- 1.00 packs/day    Types: Cigarettes    Last Attempt to Quit: 02/24/2011  . Smokeless tobacco: Never Used  . Alcohol Use:  Yes     Comment: socially    Review of Systems  Constitutional: Negative for fever.  HENT: Negative for drooling and rhinorrhea.   Eyes: Negative for pain.  Respiratory: Negative for cough and shortness of breath.   Cardiovascular: Negative for chest pain and leg swelling.  Gastrointestinal: Positive for nausea, vomiting, abdominal pain (cramping) and diarrhea.  Genitourinary: Negative for dysuria and hematuria.  Musculoskeletal: Negative for gait problem and neck pain.  Skin: Negative for color change.  Neurological: Negative for numbness and headaches.  Hematological: Negative for adenopathy.  Psychiatric/Behavioral: Negative for behavioral problems.  All other systems reviewed and are negative.      Allergies  Shellfish allergy  Home Medications  No current outpatient prescriptions on file. BP 136/79  Pulse 72  Temp(Src) 97.6 F (36.4 C) (Oral)  Resp 18  SpO2 100% Physical Exam  Nursing note and vitals reviewed. Constitutional: He is oriented to person, place, and time. He appears well-developed and well-nourished.  HENT:  Head: Normocephalic and atraumatic.  Right Ear: External ear normal.  Left Ear: External ear normal.  Nose: Nose normal.  Mouth/Throat: Oropharynx is clear and moist. No oropharyngeal exudate.  Eyes: Conjunctivae and EOM are normal. Pupils are equal, round, and reactive to light.  Neck: Normal range of motion. Neck supple.  Cardiovascular: Normal rate, regular rhythm, normal heart  sounds and intact distal pulses.  Exam reveals no gallop and no friction rub.   No murmur heard. Pulmonary/Chest: Effort normal and breath sounds normal. No respiratory distress. He has no wheezes.  Abdominal: Soft. Bowel sounds are normal. He exhibits no distension. There is no tenderness. There is no rebound and no guarding.  No focal ttp of abdomen.   Musculoskeletal: Normal range of motion. He exhibits no edema and no tenderness.  Neurological: He is alert and  oriented to person, place, and time.  Skin: Skin is warm and dry.  Psychiatric: He has a normal mood and affect. His behavior is normal.    ED Course  Procedures (including critical care time) Labs Review Labs Reviewed - No data to display Imaging Review No results found.   EKG Interpretation None      MDM   Final diagnoses:  Vomiting  Diarrhea  Nausea    3:40 PM 23 y.o. male who presents with nausea, vomiting, diarrhea, and abdominal cramping. He denies cp/sob/fever. He has a hx of pacemaker placement d/t heart block. He is afebrile and vital signs are unremarkable here. I offered labwork and IV fluid. He would prefer oral medications and to rehydrate at home. His abdomen is soft and benign. Likely viral gastroenteritis.  3:40 PM:  I have discussed the diagnosis/risks/treatment options with the patient and believe the pt to be eligible for discharge home to follow-up with pcp as needed. We also discussed returning to the ED immediately if new or worsening sx occur. We discussed the sx which are most concerning (e.g., worsening pain, inability to tol po, fever) that necessitate immediate return. Medications administered to the patient during their visit and any new prescriptions provided to the patient are listed below.  Medications given during this visit Medications  ondansetron (ZOFRAN-ODT) disintegrating tablet 4 mg (not administered)  loperamide (IMODIUM) capsule 4 mg (not administered)    Discharge Medication List as of 04/05/2013  3:42 PM    START taking these medications   Details  ondansetron (ZOFRAN ODT) 4 MG disintegrating tablet 4mg  ODT q4 hours prn nausea/vomit, Print           Junius ArgyleForrest S Oryn Casanova, MD 04/05/13 1601  Junius ArgyleForrest S Rhyanna Sorce, MD 04/05/13 1601

## 2013-04-05 NOTE — ED Notes (Signed)
Pt states that he started having diarrhea last night.  Woke up this morning and started vomiting.  States that he called into work (works at both [employer]) and was told that he needed a doctor's note or he would be fired.

## 2013-05-27 ENCOUNTER — Emergency Department (HOSPITAL_COMMUNITY)
Admission: EM | Admit: 2013-05-27 | Discharge: 2013-05-28 | Disposition: A | Discharge status: Home / Self Care | Payer: Medicaid Other | Attending: Emergency Medicine | Admitting: Emergency Medicine

## 2013-05-27 ENCOUNTER — Encounter (HOSPITAL_COMMUNITY): Payer: Self-pay | Admitting: Emergency Medicine

## 2013-05-27 ENCOUNTER — Emergency Department (HOSPITAL_COMMUNITY): Payer: Medicaid Other

## 2013-05-27 DIAGNOSIS — F17200 Nicotine dependence, unspecified, uncomplicated: Secondary | ICD-10-CM | POA: Insufficient documentation

## 2013-05-27 DIAGNOSIS — R5381 Other malaise: Secondary | ICD-10-CM | POA: Insufficient documentation

## 2013-05-27 DIAGNOSIS — R0789 Other chest pain: Secondary | ICD-10-CM

## 2013-05-27 DIAGNOSIS — R110 Nausea: Secondary | ICD-10-CM | POA: Insufficient documentation

## 2013-05-27 DIAGNOSIS — Z950 Presence of cardiac pacemaker: Secondary | ICD-10-CM | POA: Diagnosis present

## 2013-05-27 DIAGNOSIS — Z8659 Personal history of other mental and behavioral disorders: Secondary | ICD-10-CM | POA: Insufficient documentation

## 2013-05-27 DIAGNOSIS — R071 Chest pain on breathing: Secondary | ICD-10-CM | POA: Insufficient documentation

## 2013-05-27 DIAGNOSIS — J45901 Unspecified asthma with (acute) exacerbation: Secondary | ICD-10-CM | POA: Insufficient documentation

## 2013-05-27 DIAGNOSIS — R5383 Other fatigue: Secondary | ICD-10-CM

## 2013-05-27 MED ORDER — ONDANSETRON HCL 4 MG/2ML IJ SOLN
4.0000 mg | Freq: Once | INTRAMUSCULAR | Status: AC
  Administered 2013-05-28: 4 mg via INTRAVENOUS
  Filled 2013-05-27: qty 2

## 2013-05-27 NOTE — ED Provider Notes (Signed)
CSN: [id number]     Arrival date & time 05/27/13  2301 History   First MD Initiated Contact with Patient 05/27/13 2306     Chief Complaint  Patient presents with  . Dizziness  . Nausea     (Consider location/radiation/quality/duration/timing/severity/associated sxs/prior Treatment) HPI  Patient with hx second degree heart block with pacer placed 13 years ago p/w nausea, dizziness, lightheadedness, intermittent sharp chest pain, SOB, feeling like his legs are heavy.  The heaviness in his legs began 2 weeks ago.  The other symptoms started today.  The chest pain is sharp, intermittent, lasts seconds at a time, worse with moving around, reproduced with palpation.  States he feels that the pacer is vibrating and moving around on its own.  States he feels he did when his [facility] pediatric cardiologist turned his pacer off and told him if he ever feels this way he needs to go to the ER.  Pt does do heavy lifting at work.   Unsure if he has had fevers or sick contacts.  Denies sore throat, cough, abdominal pain, urinary or bowel changes, swelling in the legs.    Past Medical History  Diagnosis Date  . Asthma   . Attention deficit hyperactivity disorder (ADHD)   . Anxiety   . Pacemaker     Placed when pt was 23 years old.   Past Surgical History  Procedure Laterality Date  . Cardiac pacemaker placement    . Pacemaker insertion  2001    second degree heart block  . Hernia repair     Family History  Problem Relation Age of Onset  . Anesthesia problems Neg Hx   . Hypotension Neg Hx   . Malignant hyperthermia Neg Hx   . Pseudochol deficiency Neg Hx   . Hypertension Mother   . Diabetes Mother   . Bipolar disorder Mother   . Glaucoma Father    History  Substance Use Topics  . Smoking status: Current Every Day Smoker -- 1.00 packs/day    Types: Cigarettes    Last Attempt to Quit: 02/24/2011  . Smokeless tobacco: Never Used  . Alcohol Use: Yes     Comment: socially    Review of  Systems  All other systems reviewed and are negative.     Allergies  Shellfish allergy  Home Medications   Prior to Admission medications   Medication Sig Start Date End Date Taking? Authorizing Provider  ondansetron (ZOFRAN ODT) 4 MG disintegrating tablet 4mg  ODT q4 hours prn nausea/vomit 04/05/13   Junius Argyle, MD   BP 128/91  Pulse 59  Temp(Src) 98 F (36.7 C) (Oral)  Resp 14  SpO2 99% Physical Exam  Nursing note and vitals reviewed. Constitutional: He appears well-developed and well-nourished. No distress.  HENT:  Head: Normocephalic and atraumatic.  Neck: Neck supple.  Cardiovascular: Normal rate and regular rhythm.   Pulmonary/Chest: Effort normal and breath sounds normal. No respiratory distress. He has no wheezes. He has no rales. He exhibits tenderness.    Left chest wall tender to palpation, this reproduced the pain.    Abdominal: Soft. He exhibits no distension and no mass. There is no tenderness. There is no rebound and no guarding.  Neurological: He is alert. He exhibits normal muscle tone.  Skin: He is not diaphoretic.    ED Course  Procedures (including critical care time) Labs Review Labs Reviewed  CBC WITH DIFFERENTIAL - Abnormal; Notable for the following:    Neutrophils Relative % 39 (*)  Eosinophils Relative 6 (*)    All other components within normal limits  BASIC METABOLIC PANEL    Imaging Review Dg Chest 2 View  05/28/2013   CLINICAL DATA:  Dizziness, weakness and chest pain.  EXAM: CHEST  2 VIEW  COMPARISON:  Chest radiograph performed 08/15/2012  FINDINGS: The lungs are well-aerated and clear. There is no evidence of focal opacification, pleural effusion or pneumothorax.  The heart is normal in size; a pacemaker is noted at the left chest wall, with leads ending at the right atrium and right ventricle. No acute osseous abnormalities are seen.  IMPRESSION: No acute cardiopulmonary process seen.   Electronically Signed   By: Roanna RaiderJeffery   Chang M.D.   On: 05/28/2013 01:09     EKG Interpretation   Date/Time:  Wednesday May 27 2013 23:09:30 EDT Ventricular Rate:  60 PR Interval:  227 QRS Duration: 169 QT Interval:  485 QTC Calculation: 485 R Axis:   -69 Text Interpretation:  Atrial-ventricular dual-paced rhythm No further  analysis attempted due to paced rhythm Confirmed by OTTER  MD, OLGA  ([id number]) on 05/28/2013 12:52:37 AM       Date: 05/27/2013  Rate: 60  Rhythm: paced  QRS Axis: left  Intervals: normal  ST/T Wave abnormalities: early repolarization and paced  Conduction Disutrbances:paced  Narrative Interpretation:   Old EKG Reviewed: none available   1:02 AM Discussed patient with Dr Norlene Campbelltter  Counseled patient on smoking cessation.   MDM   Final diagnoses:  Fatigue  Chest wall pain    Pt with pacemaker due to second degree heart block p/w lightheadedness, nausea, generalized heavy feeling, intermittent sharp chest pains, reproducible by palpation.  While he does need to follow up with his new adult cardiologist (appointment in June), his pacer is working.  Labs, CXR unremarkable.  Pt is working two jobs and doing night classes, not sleeping much.  I suspect he is suffering from fatigue and also likely chest wall pain from heavy lifting at work.  Discussed result, findings, treatment, and follow up  with patient.  Pt given return precautions.  Pt verbalizes understanding and agrees with plan.        Trixie Dredgemily Terique Kawabata, PA-C 05/28/13 0130  Trixie DredgeEmily Ismael Karge, PA-C 05/28/13 0131

## 2013-05-27 NOTE — ED Notes (Signed)
EKG given to EDP,Otter,MD., for review. 

## 2013-05-27 NOTE — ED Notes (Addendum)
Patient states, "I'm feeling very agitated and irritable." Patient states that while he was at work today that he felt "dizzy, nauseated" and he also states that he is "having an out of body experience." Patient reports intermittent chest pain, which he states felt like "shocks" which ranged from 4-6/10 on pain scale Patient continues to list multiple complaints Patient states that he called his cardiologist on call and told him to come to ED, because "I am pacemaker dependent and there is a possibility I could not wake up in the morning." Patient admits that he has not followed up with his cardiologist in over two years Patient then goes on to report that he has an appointment on 6/11 "with a new cardiologist, because I am no longer a kid." EKG completed and patient on cardiac monitor--noted to be AV paced

## 2013-05-27 NOTE — ED Notes (Signed)
EDP at bedside  

## 2013-05-28 LAB — CBC WITH DIFFERENTIAL/PLATELET
BASOS PCT: 1 % (ref 0–1)
Basophils Absolute: 0 10*3/uL (ref 0.0–0.1)
EOS ABS: 0.4 10*3/uL (ref 0.0–0.7)
Eosinophils Relative: 6 % — ABNORMAL HIGH (ref 0–5)
HCT: 42.2 % (ref 39.0–52.0)
Hemoglobin: 14.8 g/dL (ref 13.0–17.0)
LYMPHS ABS: 2.8 10*3/uL (ref 0.7–4.0)
Lymphocytes Relative: 45 % (ref 12–46)
MCH: 30.9 pg (ref 26.0–34.0)
MCHC: 35.1 g/dL (ref 30.0–36.0)
MCV: 88.1 fL (ref 78.0–100.0)
Monocytes Absolute: 0.5 10*3/uL (ref 0.1–1.0)
Monocytes Relative: 9 % (ref 3–12)
NEUTROS PCT: 39 % — AB (ref 43–77)
Neutro Abs: 2.3 10*3/uL (ref 1.7–7.7)
PLATELETS: 182 10*3/uL (ref 150–400)
RBC: 4.79 MIL/uL (ref 4.22–5.81)
RDW: 12.9 % (ref 11.5–15.5)
WBC: 6.1 10*3/uL (ref 4.0–10.5)

## 2013-05-28 LAB — BASIC METABOLIC PANEL
BUN: 17 mg/dL (ref 6–23)
CO2: 25 mEq/L (ref 19–32)
Calcium: 9.4 mg/dL (ref 8.4–10.5)
Chloride: 104 mEq/L (ref 96–112)
Creatinine, Ser: 1.02 mg/dL (ref 0.50–1.35)
GLUCOSE: 96 mg/dL (ref 70–99)
POTASSIUM: 3.9 meq/L (ref 3.7–5.3)
SODIUM: 141 meq/L (ref 137–147)

## 2013-05-28 MED ORDER — SODIUM CHLORIDE 0.9 % IV BOLUS (SEPSIS)
1000.0000 mL | Freq: Once | INTRAVENOUS | Status: AC
  Administered 2013-05-28: 1000 mL via INTRAVENOUS

## 2013-05-28 NOTE — ED Provider Notes (Signed)
Medical screening examination/treatment/procedure(s) were performed by non-physician practitioner and as supervising physician I was immediately available for consultation/collaboration.   EKG Interpretation   Date/Time:  Wednesday May 27 2013 23:09:30 EDT Ventricular Rate:  60 PR Interval:  227 QRS Duration: 169 QT Interval:  485 QTC Calculation: 485 R Axis:   -69 Text Interpretation:  Atrial-ventricular dual-paced rhythm No further  analysis attempted due to paced rhythm Confirmed by Lawson Mahone  MD, Laydon Martis  ([id number]) on 05/28/2013 12:52:37 AM       Olivia Mackie, MD 05/28/13 [phone]

## 2013-05-28 NOTE — ED Notes (Signed)
Pacer interrogation in progress

## 2013-05-28 NOTE — Discharge Instructions (Signed)
Read the information below.  You may return to the Emergency Department at any time for worsening condition or any new symptoms that concern you.    You have been diagnosed by your caregiver as having chest wall pain. SEEK IMMEDIATE MEDICAL ATTENTION IF: You develop a fever.  Your chest pains become severe or intolerable.  You develop new, unexplained symptoms (problems).  You develop shortness of breath, nausea, vomiting, sweating or feel light headed.  You develop a new cough or you cough up blood.   Chest Wall Pain Chest wall pain is pain in or around the bones and muscles of your chest. It may take up to 6 weeks to get better. It may take longer if you must stay physically active in your work and activities.  CAUSES  Chest wall pain may happen on its own. However, it may be caused by:  A viral illness like the flu.  Injury.  Coughing.  Exercise.  Arthritis.  Fibromyalgia.  Shingles. HOME CARE INSTRUCTIONS   Avoid overtiring physical activity. Try not to strain or perform activities that cause pain. This includes any activities using your chest or your abdominal and side muscles, especially if heavy weights are used.  Put ice on the sore area.  Put ice in a plastic bag.  Place a towel between your skin and the bag.  Leave the ice on for 15-20 minutes per hour while awake for the first 2 days.  Only take over-the-counter or prescription medicines for pain, discomfort, or fever as directed by your caregiver. SEEK IMMEDIATE MEDICAL CARE IF:   Your pain increases, or you are very uncomfortable.  You have a fever.  Your chest pain becomes worse.  You have new, unexplained symptoms.  You have nausea or vomiting.  You feel sweaty or lightheaded.  You have a cough with phlegm (sputum), or you cough up blood. MAKE SURE YOU:   Understand these instructions.  Will watch your condition.  Will get help right away if you are not doing well or get worse. Document  Released: 12/18/2004 Document Revised: 03/12/2011 Document Reviewed: 08/14/2010 [facility] Patient Information 2014 [place], [place].  Fatigue Fatigue is a feeling of tiredness, lack of energy, lack of motivation, or feeling tired all the time. Having enough rest, good nutrition, and reducing stress will normally reduce fatigue. Consult your caregiver if it persists. The nature of your fatigue will help your caregiver to find out its cause. The treatment is based on the cause.  CAUSES  There are many causes for fatigue. Most of the time, fatigue can be traced to one or more of your habits or routines. Most causes fit into one or more of three general areas. They are: Lifestyle problems  Sleep disturbances.  Overwork.  Physical exertion.  Unhealthy habits.  Poor eating habits or eating disorders.  Alcohol and/or drug use .  Lack of proper nutrition (malnutrition). Psychological problems  Stress and/or anxiety problems.  Depression.  Grief.  Boredom. Medical Problems or Conditions  Anemia.  Pregnancy.  Thyroid gland problems.  Recovery from major surgery.  Continuous pain.  Emphysema or asthma that is not well controlled  Allergic conditions.  Diabetes.  Infections (such as mononucleosis).  Obesity.  Sleep disorders, such as sleep apnea.  Heart failure or other heart-related problems.  Cancer.  Kidney disease.  Liver disease.  Effects of certain medicines such as antihistamines, cough and cold remedies, prescription pain medicines, heart and blood pressure medicines, drugs used for treatment of cancer, and some  antidepressants. SYMPTOMS  The symptoms of fatigue include:   Lack of energy.  Lack of drive (motivation).  Drowsiness.  Feeling of indifference to the surroundings. DIAGNOSIS  The details of how you feel help guide your caregiver in finding out what is causing the fatigue. You will be asked about your present and past health condition. It  is important to review all medicines that you take, including prescription and non-prescription items. A thorough exam will be done. You will be questioned about your feelings, habits, and normal lifestyle. Your caregiver may suggest blood tests, urine tests, or other tests to look for common medical causes of fatigue.  TREATMENT  Fatigue is treated by correcting the underlying cause. For example, if you have continuous pain or depression, treating these causes will improve how you feel. Similarly, adjusting the dose of certain medicines will help in reducing fatigue.  HOME CARE INSTRUCTIONS   Try to get the required amount of good sleep every night.  Eat a healthy and nutritious diet, and drink enough water throughout the day.  Practice ways of relaxing (including yoga or meditation).  Exercise regularly.  Make plans to change situations that cause stress. Act on those plans so that stresses decrease over time. Keep your work and personal routine reasonable.  Avoid street drugs and minimize use of alcohol.  Start taking a daily multivitamin after consulting your caregiver. SEEK MEDICAL CARE IF:   You have persistent tiredness, which cannot be accounted for.  You have fever.  You have unintentional weight loss.  You have headaches.  You have disturbed sleep throughout the night.  You are feeling sad.  You have constipation.  You have dry skin.  You have gained weight.  You are taking any new or different medicines that you suspect are causing fatigue.  You are unable to sleep at night.  You develop any unusual swelling of your legs or other parts of your body. SEEK IMMEDIATE MEDICAL CARE IF:   You are feeling confused.  Your vision is blurred.  You feel faint or pass out.  You develop severe headache.  You develop severe abdominal, pelvic, or back pain.  You develop chest pain, shortness of breath, or an irregular or fast heartbeat.  You are unable to pass a  normal amount of urine.  You develop abnormal bleeding such as bleeding from the rectum or you vomit blood.  You have thoughts about harming yourself or committing suicide.  You are worried that you might harm someone else. MAKE SURE YOU:   Understand these instructions.  Will watch your condition.  Will get help right away if you are not doing well or get worse. Document Released: 10/15/2006 Document Revised: 03/12/2011 Document Reviewed: 10/15/2006 [facility] Patient Information 2014 [place], [place].    Emergency Department Resource Guide 1) Find a Doctor and Pay Out of Pocket Although you won't have to find out who is covered by your insurance plan, it is a good idea to ask around and get recommendations. You will then need to call the office and see if the doctor you have chosen will accept you as a new patient and what types of options they offer for patients who are self-pay. Some doctors offer discounts or will set up payment plans for their patients who do not have insurance, but you will need to ask so you aren't surprised when you get to your appointment.  2) Contact Your Local Health Department Not all health departments have doctors that can see patients  for sick visits, but many do, so it is worth a call to see if yours does. If you don't know where your local health department is, you can check in your phone book. The CDC also has a tool to help you locate your state's health department, and many state websites also have listings of all of their local health departments.  3) Find a Walk-in Clinic If your illness is not likely to be very severe or complicated, you may want to try a walk in clinic. These are popping up all over the country in pharmacies, drugstores, and shopping centers. They're usually staffed by nurse practitioners or physician assistants that have been trained to treat common illnesses and complaints. They're usually fairly quick and inexpensive. However, if  you have serious medical issues or chronic medical problems, these are probably not your best option.  No Primary Care Doctor: - Call Health Connect at  [phone] - they can help you locate a primary care doctor that  accepts your insurance, provides certain services, etc. - Physician Referral Service- [phone]  Chronic Pain Problems: Organization         Address  Phone   Notes  [facility]  [phone] Patients need to be referred by their primary care doctor.   Medication Assistance: Organization         Address  Phone   Notes  [facility] Medication [facility] [address] [place], [place] [address] [phone] --Must be a resident of [facility] -- Must have NO insurance coverage whatsoever (no Medicaid/ Medicare, etc.) -- The pt. MUST have a primary care doctor that directs their care regularly and follows them in the community   [facility]ist  [phone]   [employer]  [phone]    Agencies that provide inexpensive medical care: Organization         Address  Phone   Notes  [facility]  [phone]   [facility]    [phone]   [facility] [address] [place], [place] [address] [phone]   [facility] [address] [place] [phone]   Planned Parenthood    [phone]   [facility]    [phone]   [facility]  [address], [place] Phone:  [phone], Fax:  [phone] Hours of Operation:  9 am - 6 pm, M-F.  Also accepts Medicaid/Medicare and self-pay.  [facility]  [address], [place] Phone: [phone], Fax: [phone]. Hours of Operation:  8:30 am - 5:30 pm, M-F.  Also accepts Medicaid and self-pay.  [facility] [place] [address], [place] Phone: [phone]   [facility] [address] Natasha Bence [place], [place] [phone], Ext. [phone] Mondays & Thursdays: 7-9 AM.  First 15 patients are seen on a first come, first serve basis.    Medicaid-accepting [facility] Providers:  Organization         Address  Phone   Notes  [facility] [address], [place] [phone] Also accepts self-pay patients.  [facility] [address] Laurell Josephs [place], [place]  [phone]   [facility] [address], [place] [phone]   [facility] [address] [place] [phone])  [phone]   Renaye RakersVeita Bland [address], [place]   [phone] Only accepts [place] Access Medicaid patients after they have their name applied to their card.   Self-Pay (no insurance) in [facility]:  Organization         Address  Phone   Notes  Sickle Cell Patients, [facility] [address], [place] [phone]   [facility] [address], [place] [phone]   [facility] [place]  [address],  [phone]) [phone]   [facility]/Dr. Osei-Bonsu  [address] [place] [address], [place] or [phone], [place] [phone] Phone number for both [place] and [place] locations is the same.  [facility] [address], [place] [phone]) [phone]   [facility] [address] [place] [address], [place] or [address] [phone]   [facility] [address], [place] [phone]) [phone], phone; [phone], fax Sees patients 1st and 3rd Saturday of every month.  Must not qualify for public or private insurance (i.e. Medicaid, Medicare, [place] Health Choice, Veterans' Benefits)  Household income should be no more than 200% of the poverty level The clinic cannot treat you if you are pregnant or think you are  pregnant  Sexually transmitted diseases are not treated at the clinic.    Dental Care: Organization         Address  Phone  Notes  [facility] [address], [place] [phone] Accepts children up to age 24 who are enrolled in [place] or [place] Health Choice; pregnant women with a Medicaid card; and children who have applied for Medicaid or [place] Health Choice, but were declined, whose parents can pay a reduced fee at time of service.  [facility] [place]  [address], [place] [phone] Accepts children up to age 24 who are enrolled in [place] or [place] Health Choice; pregnant women with a Medicaid card; and children who have applied for Medicaid or  Health Choice, but were declined, whose parents can pay a reduced fee at time of service.  [facility]  [address], [place] [phone] Patients are seen by appointment only. Walk-ins are not accepted. [facility] will see patients 24 years of age and older. Monday - Tuesday (8am-5pm) Most Wednesdays (8:30-5pm) $30 per visit, cash only  [facility]  [address], [facility] [phone] Patients are seen by appointment only. Walk-ins are not accepted. [facility] will see patients 24 years of age and older. One Wednesday Evening (Monthly: Volunteer Based).  $30 per visit, cash only  [facility]  [phone] for adults; Children under age 214, call Graduate Pediatric Dentistry at [phone]. Children aged 614-14, please call [phone] to request a pediatric application.  Dental services are provided in all areas of dental care including fillings, crowns and bridges, complete and partial dentures, implants, gum treatment, root canals, and extractions. Preventive care is also provided. Treatment is provided to both adults and  children. Patients are selected via a lottery and there is often a waiting list.   [facility] [address], [place]  [phone] [email or web address]   [facility] [address], [place],  [place] [phone], Ext. [phone] Second and Fourth Thursday of each month, opens at 6:30 AM; Clinic ends at 9 AM.  Patients are seen on a first-come first-served basis, and a limited number are seen during each clinic.   [facility]  [address] Ether Griffins [place], [place] [phone]   Eligibility Requirements You must have lived in [place], [place], or [place] counties for at least the last three months.   You cannot be eligible for state or federal sponsored [place], including [employer], [place], or [employer].   You generally cannot be eligible for healthcare insurance through your employer.    How to apply: Eligibility screenings are held every Tuesday and Wednesday afternoon from 1:00 pm until 4:00 pm. You do not need an appointment for the interview!  [facility] [address] [place], [place] [phone]   [facility]  [phone]   [facility] Health Department  [phone]   [facility] Health Department  [phone]    Behavioral Health Resources in the Community: Intensive Outpatient Programs Organization         Address  Phone  Notes  [facility] [address], [place], [place] [phone]   [facility] [address], [place], [place] [phone]   [facility] [address], [place], [place]  [phone]   [facility] Mental Health [address],  [place], [place] [phone] or [phone]   Substance Abuse Resources Organization         Address  Phone  Notes  [facility] Drug Services  [phone]   [facility]  [phone]   [facility] [place]  [phone]    Floydene Flock  [phone]   Residential & Outpatient Substance Abuse Program  [phone]   Psychological Services Organization         Address  Phone  Notes  [facility] Health  [phone]   [facility] Services  [phone]   [facility] [address] [place] [phone] or [phone]    Mobile Crisis Teams Organization         Address  Phone  Notes  [facility], Mobile Crisis Care Unit  [phone]   [facility]  [address] [place], [place] [phone]   Doristine Locks [address] [place] [phone]    Self-Help/Support Groups Organization         Address  Phone             Notes  [facility] [place] - variety of support groups  [phone]- I7437963 Call for more information  Narcotics Anonymous (NA), [facility] [address], [employer] [place]  2 meetings at this location   Statistician         Address  Phone  Notes  [facility] [address],    [place]  [phone]   [facility]  [address] [place] [address], [place], [place] [phone]   [facility] [address] [place], [place] [phone] Admissions: 8am-3pm M-F  [facility] [address],    [place], [place] [phone]   [facility] [address] Starling Manns [place], [place] [phone]   [facility] [address],  [place], [place] [phone]   [facility] - Intensive Outpatient [address]urell Josephs [address], [place], [place] [phone]   [facility] ([facility]iction  [facility] [address]  [place], [place] [phone] or [phone]   [facility] ([facility]) [address], [place], [place] [phone] Accepts Medicaid  [facility] [address]  [place] [phone] Substance Abuse/Addiction Treatment   [facility] Organization         Address  Phone  Notes  [facility]  [phone]   Angie Fava, PhD [address] [place], [place]   [phone] or [phone]   [facility] Behavioral   [address] [place], [place] [phone]   [facility] [address] [place], [place] [phone] Insurance/Medicaid/sponsorship through [facility] and Families [address]                                    [place], [place] [phone] Therapy/tele-psych/case  [facility] [address], [place] [phone]    Dr. Lolly Mustache  [phone]   [facility] [place]  [employer] [facility] Dept. 1) [address], [place] 2) [address], [place] 3)  [address], [place] [phone]  [phone]   [facility] Child Abuse Hotline 509 117 4294 or [phone] (After Hours)

## 2013-06-03 ENCOUNTER — Encounter: Payer: Self-pay | Admitting: *Deleted

## 2013-06-05 ENCOUNTER — Telehealth: Payer: Self-pay | Admitting: Internal Medicine

## 2013-06-05 NOTE — Telephone Encounter (Signed)
Records rec From [facility] gave to Sherri/ Dr.Klein

## 2013-06-11 ENCOUNTER — Encounter: Payer: Self-pay | Admitting: *Deleted

## 2013-06-11 ENCOUNTER — Ambulatory Visit (INDEPENDENT_AMBULATORY_CARE_PROVIDER_SITE_OTHER): Payer: Medicaid Other | Admitting: Internal Medicine

## 2013-06-11 ENCOUNTER — Encounter: Payer: Self-pay | Admitting: Internal Medicine

## 2013-06-11 VITALS — BP 115/73 | HR 71 | Ht 63.0 in | Wt 117.0 lb

## 2013-06-11 DIAGNOSIS — Z950 Presence of cardiac pacemaker: Secondary | ICD-10-CM

## 2013-06-11 DIAGNOSIS — Q246 Congenital heart block: Secondary | ICD-10-CM | POA: Insufficient documentation

## 2013-06-11 DIAGNOSIS — I441 Atrioventricular block, second degree: Secondary | ICD-10-CM

## 2013-06-11 LAB — MDC_IDC_ENUM_SESS_TYPE_INCLINIC
Battery Impedance: 4400 Ohm
Battery Voltage: 2.74 V
Implantable Pulse Generator Model: 5826
Implantable Pulse Generator Serial Number: 1898278
Lead Channel Impedance Value: 356 Ohm
Lead Channel Pacing Threshold Amplitude: 0.75 V
Lead Channel Pacing Threshold Pulse Width: 0.4 ms
Lead Channel Setting Pacing Amplitude: 2.25 V
Lead Channel Setting Pacing Pulse Width: 0.4 ms
Lead Channel Setting Sensing Sensitivity: 4 mV
MDC IDC MSMT LEADCHNL RA PACING THRESHOLD AMPLITUDE: 0.625 V
MDC IDC MSMT LEADCHNL RA PACING THRESHOLD PULSEWIDTH: 0.4 ms
MDC IDC MSMT LEADCHNL RA SENSING INTR AMPL: 2.9 mV
MDC IDC MSMT LEADCHNL RV IMPEDANCE VALUE: 319 Ohm
MDC IDC MSMT LEADCHNL RV SENSING INTR AMPL: 12 mV — AB
MDC IDC SESS DTM: 20150611170757

## 2013-06-11 NOTE — Progress Notes (Signed)
      Patient Care Team: No Pcp Per Patient as PCP - General (General Practice)   HPI  Mario Wyatt is a 23 y.o. 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 He underwent generator replacement in 2008 receiving a St. Jude device. Reason for Intermedics lead implanted in 1999. He has had no syncope. He has no limitations exercise tolerance.  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   His initial implantation with subpectoral.     Past Medical History  Diagnosis Date  . Asthma   . Attention deficit hyperactivity disorder (ADHD)   . Anxiety   . Pacemaker     Placed when pt was 23 years old.    Past Surgical History  Procedure Laterality Date  . Cardiac pacemaker placement    . Pacemaker insertion  2001    second degree heart block  . Hernia repair      Current Outpatient Prescriptions  Medication Sig Dispense Refill  . acetaminophen (TYLENOL ARTHRITIS PAIN) 650 MG CR tablet Take 650 mg by mouth every 8 (eight) hours as needed for pain.      . [DISCONTINUED] ALBUTEROL IN Inhale into the lungs as needed.        . [DISCONTINUED] methylphenidate (CONCERTA) 36 MG CR tablet Take 36 mg by mouth every morning.        . [DISCONTINUED] sertraline (ZOLOFT) 50 MG tablet Take 50 mg by mouth daily.         No current facility-administered medications for this visit.   Soc hx  He is working somoking some and drinking some   He has an identical twin brother    Review of Systems negative except from HPI and PMH  Physical Exam BP 115/73  Pulse 71  Ht 5\' 3"  (1.6 m)  Wt 117 lb (53.071 kg)  BMI 20.73 kg/m2 Well developed and well nourished in no acute distress HENT normal E scleral and icterus clear Neck Supple JVP flat; carotids brisk and full Clear to ausculation Device pocket well healed; without hematoma or  erythema.suspect device is subcutaneous not subpectoral  There is no tethering  Regular rate and rhythm, no murmurs gallops or rub Soft with active bowel sounds No clubbing cyanosis  Edema Alert and oriented, grossly normal motor and sensory function Skin Warm and Dry  ECG demonstrates P-synchronous/ AV  pacing  Assessment and  Plan  High Grade Heart Block  Pacemaker  St Jude  Pacer funtion normal  Will establish followup care.  [facility] records do not explain their thoughts as to the underlying mechanism of his heart block   Gene testing may be appropriate

## 2013-06-11 NOTE — Patient Instructions (Signed)

## 2013-06-19 ENCOUNTER — Encounter: Payer: Self-pay | Admitting: Internal Medicine

## 2013-09-25 ENCOUNTER — Ambulatory Visit (HOSPITAL_BASED_OUTPATIENT_CLINIC_OR_DEPARTMENT_OTHER): Payer: Medicaid Other | Attending: Internal Medicine

## 2013-10-09 ENCOUNTER — Emergency Department (INDEPENDENT_AMBULATORY_CARE_PROVIDER_SITE_OTHER)
Admission: EM | Admit: 2013-10-09 | Discharge: 2013-10-09 | Disposition: A | Discharge status: Home / Self Care | Payer: Medicaid Other | Source: Home / Self Care | Attending: Family Medicine | Admitting: Family Medicine

## 2013-10-09 ENCOUNTER — Other Ambulatory Visit (HOSPITAL_COMMUNITY)
Admission: RE | Admit: 2013-10-09 | Discharge: 2013-10-09 | Disposition: A | Discharge status: Home / Self Care | Payer: Medicaid Other | Source: Ambulatory Visit | Attending: Family Medicine | Admitting: Family Medicine

## 2013-10-09 ENCOUNTER — Encounter (HOSPITAL_COMMUNITY): Payer: Self-pay | Admitting: Emergency Medicine

## 2013-10-09 DIAGNOSIS — Z113 Encounter for screening for infections with a predominantly sexual mode of transmission: Secondary | ICD-10-CM | POA: Diagnosis not present

## 2013-10-09 DIAGNOSIS — J45901 Unspecified asthma with (acute) exacerbation: Secondary | ICD-10-CM

## 2013-10-09 DIAGNOSIS — Z202 Contact with and (suspected) exposure to infections with a predominantly sexual mode of transmission: Secondary | ICD-10-CM

## 2013-10-09 DIAGNOSIS — L739 Follicular disorder, unspecified: Secondary | ICD-10-CM

## 2013-10-09 MED ORDER — DOXYCYCLINE HYCLATE 100 MG PO CAPS: 100.0000 mg | ORAL_CAPSULE | Freq: Two times a day (BID) | ORAL | Status: DC

## 2013-10-09 MED ORDER — ALBUTEROL SULFATE HFA 108 (90 BASE) MCG/ACT IN AERS: 2.0000 | INHALATION_SPRAY | Freq: Four times a day (QID) | RESPIRATORY_TRACT | Status: DC | PRN

## 2013-10-09 MED ORDER — IPRATROPIUM-ALBUTEROL 0.5-2.5 (3) MG/3ML IN SOLN
3.0000 mL | Freq: Once | RESPIRATORY_TRACT | Status: AC
  Administered 2013-10-09: 3 mL via RESPIRATORY_TRACT

## 2013-10-09 MED ORDER — PREDNISONE 50 MG PO TABS: 50.0000 mg | ORAL_TABLET | Freq: Every day | ORAL | Status: DC

## 2013-10-09 MED ORDER — IPRATROPIUM-ALBUTEROL 0.5-2.5 (3) MG/3ML IN SOLN
RESPIRATORY_TRACT | Status: AC
  Filled 2013-10-09: qty 3

## 2013-10-09 NOTE — Discharge Instructions (Signed)
Thank you for coming in today. Take prednisone for 5 days for asthma. Use albuterol as needed for asthma. Take doxycycline twice daily for 7 days for infection. I will call you if any test results come back positive.   Asthma Asthma is a recurring condition in which the airways tighten and narrow. Asthma can make it difficult to breathe. It can cause coughing, wheezing, and shortness of breath. Asthma episodes, also called asthma attacks, range from minor to life-threatening. Asthma cannot be cured, but medicines and lifestyle changes can help control it. CAUSES Asthma is believed to be caused by inherited (genetic) and environmental factors, but its exact cause is unknown. Asthma may be triggered by allergens, lung infections, or irritants in the air. Asthma triggers are different for each person. Common triggers include:   Animal dander.  Dust mites.  Cockroaches.  Pollen from trees or grass.  Mold.  Smoke.  Air pollutants such as dust, household cleaners, hair sprays, aerosol sprays, paint fumes, strong chemicals, or strong odors.  Cold air, weather changes, and winds (which increase molds and pollens in the air).  Strong emotional expressions such as crying or laughing hard.  Stress.  Certain medicines (such as aspirin) or types of drugs (such as beta-blockers).  Sulfites in foods and drinks. Foods and drinks that may contain sulfites include dried fruit, potato chips, and sparkling grape juice.  Infections or inflammatory conditions such as the flu, a cold, or an inflammation of the nasal membranes (rhinitis).  Gastroesophageal reflux disease (GERD).  Exercise or strenuous activity. SYMPTOMS Symptoms may occur immediately after asthma is triggered or many hours later. Symptoms include:  Wheezing.  Excessive nighttime or early morning coughing.  Frequent or severe coughing with a common cold.  Chest tightness.  Shortness of breath. DIAGNOSIS  The diagnosis of  asthma is made by a review of your medical history and a physical exam. Tests may also be performed. These may include:  Lung function studies. These tests show how much air you breathe in and out.  Allergy tests.  Imaging tests such as X-rays. TREATMENT  Asthma cannot be cured, but it can usually be controlled. Treatment involves identifying and avoiding your asthma triggers. It also involves medicines. There are 2 classes of medicine used for asthma treatment:   Controller medicines. These prevent asthma symptoms from occurring. They are usually taken every day.  Reliever or rescue medicines. These quickly relieve asthma symptoms. They are used as needed and provide short-term relief. Your health care provider will help you create an asthma action plan. An asthma action plan is a written plan for managing and treating your asthma attacks. It includes a list of your asthma triggers and how they may be avoided. It also includes information on when medicines should be taken and when their dosage should be changed. An action plan may also involve the use of a device called a peak flow meter. A peak flow meter measures how well the lungs are working. It helps you monitor your condition. HOME CARE INSTRUCTIONS   Take medicines only as directed by your health care provider. Speak with your health care provider if you have questions about how or when to take the medicines.  Use a peak flow meter as directed by your health care provider. Record and keep track of readings.  Understand and use the action plan to help minimize or stop an asthma attack without needing to seek medical care.  Control your home environment in the following ways to  help prevent asthma attacks:  Do not smoke. Avoid being exposed to secondhand smoke.  Change your heating and air conditioning filter regularly.  Limit your use of fireplaces and wood stoves.  Get rid of pests (such as roaches and mice) and their  droppings.  Throw away plants if you see mold on them.  Clean your floors and dust regularly. Use unscented cleaning products.  Try to have someone else vacuum for you regularly. Stay out of rooms while they are being vacuumed and for a short while afterward. If you vacuum, use a dust mask from a hardware store, a double-layered or microfilter vacuum cleaner bag, or a vacuum cleaner with a HEPA filter.  Replace carpet with wood, tile, or vinyl flooring. Carpet can trap dander and dust.  Use allergy-proof pillows, mattress covers, and box spring covers.  Wash bed sheets and blankets every week in hot water and dry them in a dryer.  Use blankets that are made of polyester or cotton.  Clean bathrooms and kitchens with bleach. If possible, have someone repaint the walls in these rooms with mold-resistant paint. Keep out of the rooms that are being cleaned and painted.  Wash hands frequently. SEEK MEDICAL CARE IF:   You have wheezing, shortness of breath, or a cough even if taking medicine to prevent attacks.  The colored mucus you cough up (sputum) is thicker than usual.  Your sputum changes from clear or white to yellow, green, gray, or bloody.  You have any problems that may be related to the medicines you are taking (such as a rash, itching, swelling, or trouble breathing).  You are using a reliever medicine more than 2-3 times per week.  Your peak flow is still at 50-79% of your personal best after following your action plan for 1 hour.  You have a fever. SEEK IMMEDIATE MEDICAL CARE IF:   You seem to be getting worse and are unresponsive to treatment during an asthma attack.  You are short of breath even at rest.  You get short of breath when doing very little physical activity.  You have difficulty eating, drinking, or talking due to asthma symptoms.  You develop chest pain.  You develop a fast heartbeat.  You have a bluish color to your lips or fingernails.  You  are light-headed, dizzy, or faint.  Your peak flow is less than 50% of your personal best. MAKE SURE YOU:   Understand these instructions.  Will watch your condition.  Will get help right away if you are not doing well or get worse. Document Released: 12/18/2004 Document Revised: 05/04/2013 Document Reviewed: 07/17/2012 [facility] Patient Information 2015 [place], [place]. This information is not intended to replace advice given to you by your health care provider. Make sure you discuss any questions you have with your health care provider.   Folliculitis  Folliculitis is redness, soreness, and swelling (inflammation) of the hair follicles. This condition can occur anywhere on the body. People with weakened immune systems, diabetes, or obesity have a greater risk of getting folliculitis. CAUSES  Bacterial infection. This is the most common cause.  Fungal infection.  Viral infection.  Contact with certain chemicals, especially oils and tars. Long-term folliculitis can result from bacteria that live in the nostrils. The bacteria may trigger multiple outbreaks of folliculitis over time. SYMPTOMS Folliculitis most commonly occurs on the scalp, thighs, legs, back, buttocks, and areas where hair is shaved frequently. An early sign of folliculitis is a small, white or yellow, pus-filled, itchy  lesion (pustule). These lesions appear on a red, inflamed follicle. They are usually less than 0.2 inches (5 mm) wide. When there is an infection of the follicle that goes deeper, it becomes a boil or furuncle. A group of closely packed boils creates a larger lesion (carbuncle). Carbuncles tend to occur in hairy, sweaty areas of the body. DIAGNOSIS  Your caregiver can usually tell what is wrong by doing a physical exam. A sample may be taken from one of the lesions and tested in a lab. This can help determine what is causing your folliculitis. TREATMENT  Treatment may include:  Applying warm compresses  to the affected areas.  Taking antibiotic medicines orally or applying them to the skin.  Draining the lesions if they contain a large amount of pus or fluid.  Laser hair removal for cases of long-lasting folliculitis. This helps to prevent regrowth of the hair. HOME CARE INSTRUCTIONS  Apply warm compresses to the affected areas as directed by your caregiver.  If antibiotics are prescribed, take them as directed. Finish them even if you start to feel better.  You may take over-the-counter medicines to relieve itching.  Do not shave irritated skin.  Follow up with your caregiver as directed. SEEK IMMEDIATE MEDICAL CARE IF:   You have increasing redness, swelling, or pain in the affected area.  You have a fever. MAKE SURE YOU:  Understand these instructions.  Will watch your condition.  Will get help right away if you are not doing well or get worse. Document Released: 02/26/2001 Document Revised: 06/19/2011 Document Reviewed: 03/20/2011 [facility] Patient Information 2015 [place]. This information is not intended to replace advice given to you by your health care provider. Make sure you discuss any questions you have with your health care provider.

## 2013-10-09 NOTE — ED Notes (Signed)
C/o cold sx onset 3 days Sx include: productive cough, congestion, runny nose, sneezing Also c/o rash on right axilla and below lower lip Alert, no signs of acute distress.

## 2013-10-09 NOTE — ED Provider Notes (Signed)
Mario Wyatt is a 23 y.o. male who presents to Urgent Care today for multiple issues. 1) cough: Patient is a three-day history of cough congestion wheezing and runny nose. He has tried albuterol which helps. No nausea vomiting or diarrhea. Her chest pains or palpitations or shortness of breath. 2) painful bumps under the right axilla and right lower lip. No treatment tried yet. 3) STD test: Patient had unprotected sex with a woman about a month ago. He is asymptomatic but would like testing.  Past Medical History  Diagnosis Date  . Asthma   . Attention deficit hyperactivity disorder (ADHD)   . Anxiety   . Pacemaker     Placed when pt was 23 years old.   History  Substance Use Topics  . Smoking status: Current Every Day Smoker -- 1.00 packs/day    Types: Cigarettes    Last Attempt to Quit: 02/24/2011  . Smokeless tobacco: Never Used  . Alcohol Use: Yes     Comment: socially   ROS as above Medications: No current facility-administered medications for this encounter.   Current Outpatient Prescriptions  Medication Sig Dispense Refill  . acetaminophen (TYLENOL ARTHRITIS PAIN) 650 MG CR tablet Take 650 mg by mouth every 8 (eight) hours as needed for pain.      Marland Kitchen doxycycline (VIBRAMYCIN) 100 MG capsule Take 1 capsule (100 mg total) by mouth 2 (two) times daily.  14 capsule  0  . predniSONE (DELTASONE) 50 MG tablet Take 1 tablet (50 mg total) by mouth daily.  5 tablet  0  . [DISCONTINUED] ALBUTEROL IN Inhale into the lungs as needed.        . [DISCONTINUED] methylphenidate (CONCERTA) 36 MG CR tablet Take 36 mg by mouth every morning.        . [DISCONTINUED] sertraline (ZOLOFT) 50 MG tablet Take 50 mg by mouth daily.          Exam:  BP 106/63  Pulse 76  Temp(Src) 98.5 F (36.9 C) (Oral)  Resp 16  Ht 5\' 3"  (1.6 m)  Wt 120 lb (54.432 kg)  BMI 21.26 kg/m2  SpO2 100% Gen: Well NAD HEENT: EOMI,  MMM small erythematous tender papule right lower lip. Normal posterior pharynx and  tympanic membranes Lungs: Normal work of breathing. Wheezing present bilaterally with expiratory phase Heart: RRR no MRG Abd: NABS, Soft. Nondistended, Nontender Exts: Brisk capillary refill, warm and well perfused.  Skin: Small tender pustules right axilla Genitals: No inguinal lymphadenopathy. Normal appearing circumcised penis without lesions or discharge. Testicles are descended bilaterally and nontender with no masses  Patient was given a 2.5 mg albuterol/0.5 mg Atrovent DuoNeb nebulizer treatment and felt better  No results found for this or any previous visit (from the past 24 hour(s)). No results found.  Assessment and Plan: 23 y.o. male with  1) asthma exacerbation: Treatment with prednisone and albuterol 2) folliculitis: Treatment with doxycycline 3) STD exposure: Urine cytology for gonorrhea Chlamydia and Trichomonas as well as serum blood testing for HIV and syphilis are pending. Work note provided  Discussed warning signs or symptoms. Please see discharge instructions. Patient expresses understanding.     Rodolph Bong, MD 10/09/13 [phone]

## 2013-10-10 LAB — RPR

## 2013-10-10 LAB — HIV ANTIBODY (ROUTINE TESTING W REFLEX): HIV: NONREACTIVE

## 2013-10-12 LAB — URINE CYTOLOGY ANCILLARY ONLY
CHLAMYDIA, DNA PROBE: NEGATIVE
Neisseria Gonorrhea: NEGATIVE
TRICH (WINDOWPATH): NEGATIVE

## 2014-01-06 ENCOUNTER — Encounter: Payer: Self-pay | Admitting: *Deleted

## 2014-02-10 ENCOUNTER — Ambulatory Visit (INDEPENDENT_AMBULATORY_CARE_PROVIDER_SITE_OTHER): Payer: Medicaid Other | Admitting: *Deleted

## 2014-02-10 DIAGNOSIS — Q246 Congenital heart block: Secondary | ICD-10-CM | POA: Diagnosis not present

## 2014-02-10 LAB — MDC_IDC_ENUM_SESS_TYPE_INCLINIC
Battery Voltage: 2.67 V
Implantable Pulse Generator Model: 5826
Implantable Pulse Generator Serial Number: 1898278
Lead Channel Pacing Threshold Amplitude: 0.75 V
Lead Channel Pacing Threshold Pulse Width: 0.4 ms
Lead Channel Sensing Intrinsic Amplitude: 12 mV
Lead Channel Setting Pacing Amplitude: 2.25 V
Lead Channel Setting Pacing Pulse Width: 0.4 ms
Lead Channel Setting Sensing Sensitivity: 4 mV
MDC IDC MSMT BATTERY IMPEDANCE: 10800 Ohm
MDC IDC MSMT LEADCHNL RA IMPEDANCE VALUE: 369 Ohm
MDC IDC MSMT LEADCHNL RA PACING THRESHOLD AMPLITUDE: 0.625 V
MDC IDC MSMT LEADCHNL RA PACING THRESHOLD PULSEWIDTH: 0.4 ms
MDC IDC MSMT LEADCHNL RA SENSING INTR AMPL: 2.8 mV
MDC IDC MSMT LEADCHNL RV IMPEDANCE VALUE: 324 Ohm
MDC IDC SESS DTM: 20160210114319
MDC IDC STAT BRADY RA PERCENT PACED: 33 %
MDC IDC STAT BRADY RV PERCENT PACED: 98 %

## 2014-02-10 NOTE — Progress Notes (Signed)
Pacemaker check in clinic. Normal device function. Thresholds, sensing, impedances consistent with previous measurements. RA lead impedance out of range x 1, but appears to be stable over time. Device programmed to maximize longevity. 2,075 mode switches (<1%)---max dur. 3 mins, Max A 284---no EGMs. No high ventricular rates noted. Device programmed at appropriate safety margins. Histogram distribution appropriate for patient activity level. Device programmed to optimize intrinsic conduction. Estimated longevity 0.75-1 years. Patient will follow up with SK 06-2014.

## 2014-03-04 ENCOUNTER — Encounter: Payer: Self-pay | Admitting: Cardiovascular Disease

## 2014-04-11 ENCOUNTER — Encounter (HOSPITAL_COMMUNITY): Payer: Self-pay

## 2014-04-11 ENCOUNTER — Emergency Department (HOSPITAL_COMMUNITY)
Admission: EM | Admit: 2014-04-11 | Discharge: 2014-04-11 | Disposition: A | Discharge status: Home / Self Care | Payer: Medicaid Other | Attending: Emergency Medicine | Admitting: Emergency Medicine

## 2014-04-11 DIAGNOSIS — J45901 Unspecified asthma with (acute) exacerbation: Secondary | ICD-10-CM | POA: Diagnosis not present

## 2014-04-11 DIAGNOSIS — Z8659 Personal history of other mental and behavioral disorders: Secondary | ICD-10-CM | POA: Diagnosis not present

## 2014-04-11 DIAGNOSIS — Y9289 Other specified places as the place of occurrence of the external cause: Secondary | ICD-10-CM | POA: Insufficient documentation

## 2014-04-11 DIAGNOSIS — Z79899 Other long term (current) drug therapy: Secondary | ICD-10-CM | POA: Diagnosis not present

## 2014-04-11 DIAGNOSIS — Z720 Tobacco use: Secondary | ICD-10-CM | POA: Diagnosis not present

## 2014-04-11 DIAGNOSIS — Z950 Presence of cardiac pacemaker: Secondary | ICD-10-CM | POA: Insufficient documentation

## 2014-04-11 DIAGNOSIS — Y998 Other external cause status: Secondary | ICD-10-CM | POA: Insufficient documentation

## 2014-04-11 DIAGNOSIS — X58XXXA Exposure to other specified factors, initial encounter: Secondary | ICD-10-CM | POA: Diagnosis not present

## 2014-04-11 DIAGNOSIS — Y9389 Activity, other specified: Secondary | ICD-10-CM | POA: Diagnosis not present

## 2014-04-11 DIAGNOSIS — T7840XA Allergy, unspecified, initial encounter: Secondary | ICD-10-CM | POA: Insufficient documentation

## 2014-04-11 DIAGNOSIS — Z91013 Allergy to seafood: Secondary | ICD-10-CM | POA: Diagnosis not present

## 2014-04-11 DIAGNOSIS — J9801 Acute bronchospasm: Secondary | ICD-10-CM

## 2014-04-11 MED ORDER — ALBUTEROL SULFATE HFA 108 (90 BASE) MCG/ACT IN AERS: 2.0000 | INHALATION_SPRAY | RESPIRATORY_TRACT | Status: DC | PRN

## 2014-04-11 MED ORDER — FAMOTIDINE 20 MG PO TABS: 20.0000 mg | ORAL_TABLET | Freq: Two times a day (BID) | ORAL | Status: DC

## 2014-04-11 MED ORDER — PREDNISONE 20 MG PO TABS
60.0000 mg | ORAL_TABLET | Freq: Once | ORAL | Status: AC
  Administered 2014-04-11: 60 mg via ORAL
  Filled 2014-04-11: qty 3

## 2014-04-11 MED ORDER — EPINEPHRINE 0.3 MG/0.3ML IJ SOAJ: 0.3000 mg | Freq: Once | INTRAMUSCULAR | Status: DC

## 2014-04-11 MED ORDER — FAMOTIDINE 20 MG PO TABS
20.0000 mg | ORAL_TABLET | Freq: Once | ORAL | Status: AC
  Administered 2014-04-11: 20 mg via ORAL
  Filled 2014-04-11: qty 1

## 2014-04-11 MED ORDER — ALBUTEROL SULFATE (2.5 MG/3ML) 0.083% IN NEBU
5.0000 mg | INHALATION_SOLUTION | Freq: Once | RESPIRATORY_TRACT | Status: AC
  Administered 2014-04-11: 5 mg via RESPIRATORY_TRACT
  Filled 2014-04-11: qty 6

## 2014-04-11 MED ORDER — PREDNISONE 20 MG PO TABS: ORAL_TABLET | ORAL | Status: DC

## 2014-04-11 NOTE — Discharge Instructions (Signed)
Stay cool, getting overheated may make your symptoms return. He can take Benadryl 50 mg every 4-6 hours as needed for itching or swelling. Take the prednisone and Pepcid until gone. Use the albuterol inhaler for any wheezing or shortness of breath.Have the EpiPen handy for use if you have another acute reaction with difficulty swallowing or breathing. Let your friends know you have seafood allergy so you don't get accidentally exposed to seafood again.   Allergies Allergies may happen from anything your body is sensitive to. This may be food, medicines, pollens, chemicals, and nearly anything around you in everyday life that produces allergens. An allergen is anything that causes an allergy producing substance. Heredity is often a factor in causing these problems. This means you may have some of the same allergies as your parents. Food allergies happen in all age groups. Food allergies are some of the most severe and life threatening. Some common food allergies are cow's milk, seafood, eggs, nuts, wheat, and soybeans. SYMPTOMS   Swelling around the mouth.  An itchy red rash or hives.  Vomiting or diarrhea.  Difficulty breathing. SEVERE ALLERGIC REACTIONS ARE LIFE-THREATENING. This reaction is called anaphylaxis. It can cause the mouth and throat to swell and cause difficulty with breathing and swallowing. In severe reactions only a trace amount of food (for example, peanut oil in a salad) may cause death within seconds. Seasonal allergies occur in all age groups. These are seasonal because they usually occur during the same season every year. They may be a reaction to molds, grass pollens, or tree pollens. Other causes of problems are house dust mite allergens, pet dander, and mold spores. The symptoms often consist of nasal congestion, a runny itchy nose associated with sneezing, and tearing itchy eyes. There is often an associated itching of the mouth and ears. The problems happen when you come in  contact with pollens and other allergens. Allergens are the particles in the air that the body reacts to with an allergic reaction. This causes you to release allergic antibodies. Through a chain of events, these eventually cause you to release histamine into the blood stream. Although it is meant to be protective to the body, it is this release that causes your discomfort. This is why you were given anti-histamines to feel better. If you are unable to pinpoint the offending allergen, it may be determined by skin or blood testing. Allergies cannot be cured but can be controlled with medicine. Hay fever is a collection of all or some of the seasonal allergy problems. It may often be treated with simple over-the-counter medicine such as diphenhydramine. Take medicine as directed. Do not drink alcohol or drive while taking this medicine. Check with your caregiver or package insert for child dosages. If these medicines are not effective, there are many new medicines your caregiver can prescribe. Stronger medicine such as nasal spray, eye drops, and corticosteroids may be used if the first things you try do not work well. Other treatments such as immunotherapy or desensitizing injections can be used if all else fails. Follow up with your caregiver if problems continue. These seasonal allergies are usually not life threatening. They are generally more of a nuisance that can often be handled using medicine. HOME CARE INSTRUCTIONS   If unsure what causes a reaction, keep a diary of foods eaten and symptoms that follow. Avoid foods that cause reactions.  If hives or rash are present:  Take medicine as directed.  You may use an over-the-counter antihistamine (diphenhydramine)  for hives and itching as needed.  Apply cold compresses (cloths) to the skin or take baths in cool water. Avoid hot baths or showers. Heat will make a rash and itching worse.  If you are severely allergic:  Following a treatment for a  severe reaction, hospitalization is often required for closer follow-up.  Wear a medic-alert bracelet or necklace stating the allergy.  You and your family must learn how to give adrenaline or use an anaphylaxis kit.  If you have had a severe reaction, always carry your anaphylaxis kit or EpiPen with you. Use this medicine as directed by your caregiver if a severe reaction is occurring. Failure to do so could have a fatal outcome. SEEK MEDICAL CARE IF:  You suspect a food allergy. Symptoms generally happen within 30 minutes of eating a food.  Your symptoms have not gone away within 2 days or are getting worse.  You develop new symptoms.  You want to retest yourself or your child with a food or drink you think causes an allergic reaction. Never do this if an anaphylactic reaction to that food or drink has happened before. Only do this under the care of a caregiver. SEEK IMMEDIATE MEDICAL CARE IF:   You have difficulty breathing, are wheezing, or have a tight feeling in your chest or throat.  You have a swollen mouth, or you have hives, swelling, or itching all over your body.  You have had a severe reaction that has responded to your anaphylaxis kit or an EpiPen. These reactions may return when the medicine has worn off. These reactions should be considered life threatening. MAKE SURE YOU:   Understand these instructions.  Will watch your condition.  Will get help right away if you are not doing well or get worse. Document Released: 03/13/2002 Document Revised: 04/14/2012 Document Reviewed: 08/18/2007 [facility] Patient Information 2015 [place], [place]. This information is not intended to replace advice given to you by your health care provider. Make sure you discuss any questions you have with your health care provider.

## 2014-04-11 NOTE — ED Provider Notes (Signed)
CSN: [id number]     Arrival date & time 04/11/14  0203 History   First MD Initiated Contact with Patient 04/11/14 0243     Chief Complaint  Patient presents with  . Allergic Reaction     (Consider location/radiation/quality/duration/timing/severity/associated sxs/prior Treatment) HPI  Patient reports he has a seafood allergy and is allergic to crabs and shrimp. His girlfriend of one month didn't know he had allergy and she went out and got food for them. He ate shrimp and grits and within 5-10 minutes he felt like his throat was closing up, he had itching of his right ear, he felt like his tongue was swelling in his face was itching. This happened about 1:50 in the morning. About 2 AM he took 50 mg of Benadryl which has helped however he feels like he still having some trouble swallowing on the right side and indicates his mid neck. He denies chest pain but states he does feel like he is having some wheezing and shortness of breath.patient has prior history of allergic reactions and is supposed to have an EpiPen however he did not have one tonight.   PCP patient cannot recall but he is on [employer]  Past Medical History  Diagnosis Date  . Asthma   . Attention deficit hyperactivity disorder (ADHD)   . Anxiety   . Pacemaker     Placed when pt was 24 years old.   Past Surgical History  Procedure Laterality Date  . Cardiac pacemaker placement    . Pacemaker insertion  2001    second degree heart block  . Hernia repair     Family History  Problem Relation Age of Onset  . Anesthesia problems Neg Hx   . Hypotension Neg Hx   . Malignant hyperthermia Neg Hx   . Pseudochol deficiency Neg Hx   . Hypertension Mother   . Diabetes Mother   . Bipolar disorder Mother   . Glaucoma Father    History  Substance Use Topics  . Smoking status: Current Every Day Smoker -- 1.00 packs/day    Types: Cigarettes    Last Attempt to Quit: 02/24/2011  . Smokeless tobacco: Never Used  . 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     Allergies  Shellfish allergy  Home Medications   Prior to Admission medications   Medication Sig Start Date End Date Taking? Authorizing Provider  acetaminophen (TYLENOL) 500 MG tablet Take 1,000 mg by mouth every 6 (six) hours as needed for moderate pain.   Yes Historical Provider, MD  albuterol (PROVENTIL HFA;VENTOLIN HFA) 108 (90 BASE) MCG/ACT inhaler Inhale 2 puffs into the lungs every 4 (four) hours as needed for wheezing or shortness of breath. 04/11/14   Devoria Albe, MD  EPINEPHrine (EPIPEN 2-PAK) 0.3 mg/0.3 mL IJ SOAJ injection Inject 0.3 mLs (0.3 mg total) into the muscle once. 04/11/14   Devoria Albe, MD  famotidine (PEPCID) 20 MG tablet Take 1 tablet (20 mg total) by mouth 2 (two) times daily. 04/11/14   Devoria Albe, MD  predniSONE (DELTASONE) 20 MG tablet Take 3 po QD x 3d , then 2 po QD x 3d then 1 po QD x 3d 04/11/14   Devoria Albe, MD   BP 99/65 mmHg  Pulse 102  Temp(Src) 97.3 F (36.3 C) (Oral)  Resp 16  Ht 5\' 4"  (  1.626 m)  Wt 135 lb (61.236 kg)  BMI 23.16 kg/m2  SpO2 100%  Vital signs normal except for borderline blood pressure and mild tachycardia  Physical Exam  Constitutional: He is oriented to person, place, and time. He appears well-developed and well-nourished.  Non-toxic appearance. He does not appear ill. No distress.  HENT:  Head: Normocephalic and atraumatic.  Right Ear: External ear normal.  Left Ear: External ear normal.  Nose: Nose normal. No mucosal edema or rhinorrhea.  Mouth/Throat: Oropharynx is clear and moist and mucous membranes are normal. No dental abscesses or uvula swelling.  Eyes: Conjunctivae and EOM are normal. Pupils are equal, round, and reactive to light.  Neck: Normal range of motion and full passive range of motion without pain. Neck supple.  Cardiovascular: Normal rate,  regular rhythm and normal heart sounds.  Exam reveals no gallop and no friction rub.   No murmur heard. Pulmonary/Chest: Effort normal and breath sounds normal. No respiratory distress. He has no wheezes. He has no rhonchi. He has no rales. He exhibits no tenderness and no crepitus.  Rare late and expiratory wheezes that are scattered  Abdominal: Soft. Normal appearance and bowel sounds are normal. He exhibits no distension. There is no tenderness. There is no rebound and no guarding.  Musculoskeletal: Normal range of motion. He exhibits no edema or tenderness.  Moves all extremities well.   Neurological: He is alert and oriented to person, place, and time. He has normal strength. No cranial nerve deficit.  Skin: Skin is warm, dry and intact. No rash noted. No erythema. No pallor.  Psychiatric: He has a normal mood and affect. His speech is normal and behavior is normal. His mood appears not anxious.  Nursing note and vitals reviewed.   ED Course  Procedures (including critical care time)  Medications  albuterol (PROVENTIL) (2.5 MG/3ML) 0.083% nebulizer solution 5 mg (5 mg Nebulization Given 04/11/14 0335)  predniSONE (DELTASONE) tablet 60 mg (60 mg Oral Given 04/11/14 0335)  famotidine (PEPCID) tablet 20 mg (20 mg Oral Given 04/11/14 0335)    Patient wanted his medication to be given orally.  4:19 AM patient is feeling much improved and feels ready to be discharged.  Labs Review Labs Reviewed - No data to display  Imaging Review No results found.   EKG Interpretation None      MDM   Final diagnoses:  Acute allergic reaction, initial encounter  Seafood allergy  Bronchospasm, acute   New Prescriptions   ALBUTEROL (PROVENTIL HFA;VENTOLIN HFA) 108 (90 BASE) MCG/ACT INHALER    Inhale 2 puffs into the lungs every 4 (four) hours as needed for wheezing or shortness of breath.   EPINEPHRINE (EPIPEN 2-PAK) 0.3 MG/0.3 ML IJ SOAJ INJECTION    Inject 0.3 mLs (0.3 mg total) into the  muscle once.   FAMOTIDINE (PEPCID) 20 MG TABLET    Take 1 tablet (20 mg total) by mouth 2 (two) times daily.   PREDNISONE (DELTASONE) 20 MG TABLET    Take 3 po QD x 3d , then 2 po QD x 3d then 1 po QD x 3d    Plan discharge  Devoria Albe, MD, Concha Pyo, MD 04/11/14 [phone]

## 2014-04-11 NOTE — ED Notes (Signed)
Dr. Lynelle Doctor made aware that patient is feeling much better and is ready to go home.

## 2014-04-11 NOTE — ED Notes (Signed)
Patient reports he ate shrimp 30 minutes PTA and is now experiencing shortness of breath, itching, heart racing, etc.  He reports this has happened in the past.  He is supposed to carry an Epi-Pen, but does not have one at this time.  Had 50mg  of Benadryl just PTA.

## 2014-05-26 DIAGNOSIS — F909 Attention-deficit hyperactivity disorder, unspecified type: Secondary | ICD-10-CM | POA: Insufficient documentation

## 2014-05-26 DIAGNOSIS — M549 Dorsalgia, unspecified: Secondary | ICD-10-CM

## 2014-05-26 DIAGNOSIS — M542 Cervicalgia: Secondary | ICD-10-CM | POA: Insufficient documentation

## 2014-05-26 DIAGNOSIS — G8929 Other chronic pain: Secondary | ICD-10-CM | POA: Insufficient documentation

## 2014-06-03 ENCOUNTER — Ambulatory Visit: Payer: Self-pay | Admitting: Dietician

## 2014-06-09 ENCOUNTER — Telehealth: Payer: Self-pay | Admitting: Internal Medicine

## 2014-06-09 ENCOUNTER — Ambulatory Visit: Payer: Self-pay | Admitting: Skilled Nursing Facility1

## 2014-06-09 NOTE — Telephone Encounter (Signed)
New problem   Pt need to speak to nurse concerning his pacer replacement procedure. Please call pt.

## 2014-06-10 NOTE — Telephone Encounter (Signed)
lmtcb  Informed patient that I would call him next week when I return to office. Explained that if he is concerned about his battery to call device clinic and they can let him know if there is a concern.

## 2014-06-16 NOTE — Telephone Encounter (Signed)
Follow Up  Pt has not received a call back about this issue. Per her note, Pt was advised to contact the device department about the battery issue. Please call the pt back to discuss

## 2014-06-17 NOTE — Telephone Encounter (Signed)
LMOVM for pt to return call 

## 2014-06-21 NOTE — Telephone Encounter (Signed)
Informed pt that he had .75-1 year left on his battery life at [facility]. 2016 appt. Informed pt that I will get a scheduler to contact him to schedule that June appt w/ MD. Pt agreed w/ this plan.

## 2014-07-06 ENCOUNTER — Ambulatory Visit: Payer: Self-pay | Admitting: Dietician

## 2014-07-30 ENCOUNTER — Encounter: Payer: Self-pay | Admitting: Internal Medicine

## 2014-08-02 ENCOUNTER — Telehealth: Payer: Self-pay | Admitting: Physician Assistant

## 2014-08-02 ENCOUNTER — Ambulatory Visit (INDEPENDENT_AMBULATORY_CARE_PROVIDER_SITE_OTHER): Payer: Medicaid Other | Admitting: Nurse Practitioner

## 2014-08-02 ENCOUNTER — Encounter: Payer: Self-pay | Admitting: Nurse Practitioner

## 2014-08-02 ENCOUNTER — Encounter: Payer: Self-pay | Admitting: *Deleted

## 2014-08-02 VITALS — BP 110/56 | HR 77 | Ht 64.0 in | Wt 122.2 lb

## 2014-08-02 DIAGNOSIS — Q246 Congenital heart block: Secondary | ICD-10-CM

## 2014-08-02 LAB — CUP PACEART INCLINIC DEVICE CHECK
Lead Channel Setting Pacing Amplitude: 2.25 V
Lead Channel Setting Pacing Pulse Width: 0.4 ms
Lead Channel Setting Sensing Sensitivity: 4 mV
MDC IDC PG SERIAL: 1898278
MDC IDC SESS DTM: 20160801123936

## 2014-08-02 NOTE — Progress Notes (Signed)
Electrophysiology Office Note Date: 08/02/2014  ID:  Mario Wyatt, DOB [birth year], MRN [id number]  PCP: No PCP Per Patient Electrophysiologist: Graciela Husbands  CC: Pacemaker follow-up  Mario Wyatt is a 24 y.o. male seen today for Dr Graciela Husbands.  He established last year in our pacemaker clinic and presents today for routine EP follow up.  Since last being seen in our clinic, the patient reports doing very well.  He denies chest pain, palpitations, dyspnea, PND, orthopnea, nausea, vomiting, dizziness, syncope.  Device History: STJ dual chamber PPM implanted 1999 for complete heart block, last gen change 2008   Past Medical History  Diagnosis Date  . Asthma   . Attention deficit hyperactivity disorder (ADHD)   . Anxiety   . Pacemaker     Placed when pt was 24 years old.   Past Surgical History  Procedure Laterality Date  . Cardiac pacemaker placement    . Pacemaker insertion  2001    second degree heart block  . Hernia repair      Current Outpatient Prescriptions  Medication Sig Dispense Refill  . acetaminophen (TYLENOL) 500 MG tablet Take 1,000 mg by mouth every 6 (six) hours as needed for moderate pain.    Marland Kitchen albuterol (PROVENTIL HFA;VENTOLIN HFA) 108 (90 BASE) MCG/ACT inhaler Inhale 2 puffs into the lungs every 4 (four) hours as needed for wheezing or shortness of breath. 1 Inhaler 0  . buPROPion (WELLBUTRIN SR) 150 MG 12 hr tablet Take 150 mg by mouth daily.    Marland Kitchen EPINEPHrine (EPIPEN 2-PAK) 0.3 mg/0.3 mL IJ SOAJ injection Inject 0.3 mLs (0.3 mg total) into the muscle once. 2 Device 0  . meloxicam (MOBIC) 15 MG tablet Take 15 mg by mouth daily.    . traMADol (ULTRAM) 50 MG tablet Take 50 mg by mouth daily.    . [DISCONTINUED] ALBUTEROL IN Inhale into the lungs as needed.      . [DISCONTINUED] methylphenidate (CONCERTA) 36 MG CR tablet Take 36 mg by mouth every morning.      . [DISCONTINUED] sertraline (ZOLOFT) 50 MG tablet Take 50 mg by mouth daily.       No current  facility-administered medications for this visit.    Allergies:   Iodine and Shellfish allergy   Social History: History   Social History  . Marital Status: Single    Spouse Name: N/A  . Number of Children: N/A  . Years of Education: N/A   Occupational History  . Not on file.   Social History Main Topics  . Smoking status: Current Every Day Smoker -- 1.00 packs/day    Types: Cigarettes    Last Attempt to Quit: 02/24/2011  . Smokeless tobacco: Never Used  . Alcohol Use: Yes     Comment: socially  . Drug Use: No  . Sexual Activity: Not Currently    Birth Control/ Protection: Abstinence   Other Topics Concern  . Not on file   Social History Narrative    Family History: Family History  Problem Relation Age of Onset  . Anesthesia problems Neg Hx   . Hypotension Neg Hx   . Malignant hyperthermia Neg Hx   . Pseudochol deficiency Neg Hx   . Hypertension Mother   . Diabetes Mother   . Bipolar disorder Mother   . Glaucoma Father      Review of Systems: All other systems reviewed and are otherwise negative except as noted above.   Physical Exam: VS:  Ht 5\' 4"  (1.626 m)  Wt  122 lb 3.2 oz (55.43 kg)  BMI 20.97 kg/m2 , BMI Body mass index is 20.97 kg/(m^2).  GEN- The patient is well appearing, alert and oriented x 3 today.   HEENT: normocephalic, atraumatic; sclera clear, conjunctiva pink; hearing intact; oropharynx clear; neck supple  Lungs- Clear to ausculation bilaterally, normal work of breathing.  No wheezes, rales, rhonchi Heart- Regular rate and rhythm (paced) GI- soft, non-tender, non-distended, bowel sounds present Extremities- no clubbing, cyanosis, or edema; DP/PT/radial pulses 2+ bilaterally MS- no significant deformity or atrophy Skin- warm and dry, no rash or lesion; PPM pocket well healed Psych- euthymic mood, full affect Neuro- strength and sensation are intact  PPM Interrogation- reviewed in detail today,  See PACEART report  EKG:  EKG is  ordered today. The ekg ordered today shows sinus rhythm with ventricular pacing, rate 77  Recent Labs: No results found for requested labs within last 365 days.   Wt Readings from Last 3 Encounters:  08/02/14 122 lb 3.2 oz (55.43 kg)  04/11/14 135 lb (61.236 kg)  10/09/13 120 lb (54.432 kg)     Other studies Reviewed: Additional studies/ records that were reviewed today include: Dr Odessa Fleming office notes  Assessment and Plan:  1.  Complete heart block  Normal PPM function - device at [facility] See Pace Art report Risks, benefits of PPM generator change reviewed with the patient today who wishes to proceed. Will schedule with Dr Graciela Husbands at next available time.  Device is sub-pectoral.    Current medicines are reviewed at length with the patient today.   The patient does not have concerns regarding his medicines.  The following changes were made today:  none  Labs/ tests ordered today include: pre-procedure labs    Disposition:   Follow up with Dr Graciela Husbands after generator change     Signed, Gypsy Balsam, NP 08/02/2014 12:21 PM  [facility] [address] [place] [address] [phone] (office) [phone] (fax)

## 2014-08-02 NOTE — Patient Instructions (Addendum)
Medication Instructions:  Your physician recommends that you continue on your current medications as directed. Please refer to the Current Medication list given to you today.    Labwork:  LABS TO BE SCHEDULED FOR WEEK OF 09/01/14   Testing/Procedures: SEE LETTER    Follow-Up:  10 TO 14 DAYS WOUND CHECK  WITH DEVICE CLINIC AFTER  09/08/14   3 MONTHS WITH DR Graciela Husbands PHYS PACER CHECK    Any Other Special Instructions Will Be Listed Below (If Applicable).

## 2014-08-30 ENCOUNTER — Other Ambulatory Visit: Payer: Self-pay

## 2014-09-01 ENCOUNTER — Other Ambulatory Visit (INDEPENDENT_AMBULATORY_CARE_PROVIDER_SITE_OTHER): Payer: Medicaid Other | Admitting: *Deleted

## 2014-09-01 DIAGNOSIS — Q246 Congenital heart block: Secondary | ICD-10-CM | POA: Diagnosis not present

## 2014-09-01 LAB — CBC
HCT: 44.2 % (ref 39.0–52.0)
Hemoglobin: 15.1 g/dL (ref 13.0–17.0)
MCHC: 34.1 g/dL (ref 30.0–36.0)
MCV: 87.4 fl (ref 78.0–100.0)
Platelets: 228 10*3/uL (ref 150.0–400.0)
RBC: 5.05 Mil/uL (ref 4.22–5.81)
RDW: 13.9 % (ref 11.5–15.5)
WBC: 5 10*3/uL (ref 4.0–10.5)

## 2014-09-01 LAB — BASIC METABOLIC PANEL
BUN: 14 mg/dL (ref 6–23)
CO2: 26 meq/L (ref 19–32)
Calcium: 9.1 mg/dL (ref 8.4–10.5)
Chloride: 106 mEq/L (ref 96–112)
Creatinine, Ser: 1.04 mg/dL (ref 0.40–1.50)
GFR: 111.89 mL/min (ref >60.00)
Glucose, Bld: 90 mg/dL (ref 70–99)
POTASSIUM: 3.5 meq/L (ref 3.5–5.1)
SODIUM: 139 meq/L (ref 135–145)

## 2014-09-08 ENCOUNTER — Encounter (HOSPITAL_COMMUNITY): Payer: Self-pay | Admitting: Internal Medicine

## 2014-09-08 ENCOUNTER — Other Ambulatory Visit: Payer: Self-pay

## 2014-09-08 ENCOUNTER — Encounter (HOSPITAL_COMMUNITY)
Admission: RE | Disposition: A | Discharge status: Home / Self Care | Payer: Self-pay | Source: Ambulatory Visit | Attending: Internal Medicine

## 2014-09-08 ENCOUNTER — Ambulatory Visit (HOSPITAL_COMMUNITY)
Admission: RE | Admit: 2014-09-08 | Discharge: 2014-09-10 | Disposition: A | Discharge status: Home / Self Care | Payer: Medicaid Other | Source: Ambulatory Visit | Attending: Internal Medicine | Admitting: Internal Medicine

## 2014-09-08 DIAGNOSIS — I442 Atrioventricular block, complete: Secondary | ICD-10-CM | POA: Diagnosis present

## 2014-09-08 DIAGNOSIS — Z45010 Encounter for checking and testing of cardiac pacemaker pulse generator [battery]: Secondary | ICD-10-CM | POA: Diagnosis not present

## 2014-09-08 DIAGNOSIS — Z959 Presence of cardiac and vascular implant and graft, unspecified: Secondary | ICD-10-CM

## 2014-09-08 DIAGNOSIS — Q246 Congenital heart block: Secondary | ICD-10-CM

## 2014-09-08 DIAGNOSIS — Z950 Presence of cardiac pacemaker: Secondary | ICD-10-CM | POA: Diagnosis not present

## 2014-09-08 HISTORY — DX: Congenital heart block: Q24.6

## 2014-09-08 HISTORY — DX: Presence of cardiac pacemaker: Z95.0

## 2014-09-08 HISTORY — PX: EP IMPLANTABLE DEVICE: SHX172B

## 2014-09-08 LAB — SURGICAL PCR SCREEN
MRSA, PCR: NEGATIVE
Staphylococcus aureus: POSITIVE — AB

## 2014-09-08 SURGERY — PACEMAKER IMPLANT

## 2014-09-08 MED ORDER — FENTANYL CITRATE (PF) 100 MCG/2ML IJ SOLN
INTRAMUSCULAR | Status: AC
  Filled 2014-09-08: qty 4

## 2014-09-08 MED ORDER — DIPHENHYDRAMINE HCL 25 MG PO CAPS
25.0000 mg | ORAL_CAPSULE | Freq: Four times a day (QID) | ORAL | Status: DC | PRN
  Administered 2014-09-08 – 2014-09-09 (×2): 25 mg via ORAL
  Filled 2014-09-08 (×2): qty 1

## 2014-09-08 MED ORDER — FENTANYL CITRATE (PF) 100 MCG/2ML IJ SOLN
INTRAMUSCULAR | Status: DC | PRN
  Administered 2014-09-08 (×5): 25 ug via INTRAVENOUS

## 2014-09-08 MED ORDER — FENTANYL CITRATE (PF) 100 MCG/2ML IJ SOLN
50.0000 ug | Freq: Once | INTRAMUSCULAR | Status: AC
  Administered 2014-09-08: 50 ug via INTRAVENOUS
  Filled 2014-09-08: qty 2

## 2014-09-08 MED ORDER — CEFAZOLIN SODIUM-DEXTROSE 2-3 GM-% IV SOLR
2.0000 g | INTRAVENOUS | Status: DC
Start: 2014-09-08 — End: 2014-09-08

## 2014-09-08 MED ORDER — ENSURE ENLIVE PO LIQD
237.0000 mL | Freq: Two times a day (BID) | ORAL | Status: DC
  Administered 2014-09-08 – 2014-09-10 (×3): 237 mL via ORAL

## 2014-09-08 MED ORDER — HEPARIN (PORCINE) IN NACL 2-0.9 UNIT/ML-% IJ SOLN
INTRAMUSCULAR | Status: DC | PRN
  Administered 2014-09-08: 500 mL

## 2014-09-08 MED ORDER — MUPIROCIN 2 % EX OINT
1.0000 "application " | TOPICAL_OINTMENT | Freq: Once | CUTANEOUS | Status: AC
  Administered 2014-09-08: 1 via TOPICAL
  Filled 2014-09-08: qty 22

## 2014-09-08 MED ORDER — NITROGLYCERIN 0.4 MG SL SUBL
SUBLINGUAL_TABLET | SUBLINGUAL | Status: AC
  Administered 2014-09-08: 0.4 mg
  Administered 2014-09-08 (×2)
  Filled 2014-09-08: qty 1

## 2014-09-08 MED ORDER — HYDROCODONE-ACETAMINOPHEN 5-325 MG PO TABS
1.0000 | ORAL_TABLET | ORAL | Status: DC | PRN
  Administered 2014-09-08 – 2014-09-10 (×5): 2 via ORAL
  Filled 2014-09-08 (×6): qty 2

## 2014-09-08 MED ORDER — LIDOCAINE HCL (PF) 1 % IJ SOLN
INTRAMUSCULAR | Status: DC | PRN
  Administered 2014-09-08: 62 mL

## 2014-09-08 MED ORDER — CEFAZOLIN SODIUM 1-5 GM-% IV SOLN
1.0000 g | Freq: Four times a day (QID) | INTRAVENOUS | Status: AC
  Administered 2014-09-08 – 2014-09-09 (×3): 1 g via INTRAVENOUS
  Filled 2014-09-08 (×4): qty 50

## 2014-09-08 MED ORDER — SODIUM CHLORIDE 0.9 % IV SOLN
INTRAVENOUS | Status: DC
  Administered 2014-09-08: 08:00:00 via INTRAVENOUS

## 2014-09-08 MED ORDER — MIDAZOLAM HCL 5 MG/5ML IJ SOLN
INTRAMUSCULAR | Status: DC | PRN
  Administered 2014-09-08 (×3): 2 mg via INTRAVENOUS
  Administered 2014-09-08: 1 mg via INTRAVENOUS
  Administered 2014-09-08: 2 mg via INTRAVENOUS
  Administered 2014-09-08: 1 mg via INTRAVENOUS

## 2014-09-08 MED ORDER — TRAMADOL HCL 50 MG PO TABS
50.0000 mg | ORAL_TABLET | Freq: Every day | ORAL | Status: DC
  Administered 2014-09-08 – 2014-09-10 (×3): 50 mg via ORAL
  Filled 2014-09-08 (×3): qty 1

## 2014-09-08 MED ORDER — ALBUTEROL SULFATE (2.5 MG/3ML) 0.083% IN NEBU
3.0000 mL | INHALATION_SOLUTION | RESPIRATORY_TRACT | Status: DC | PRN
  Administered 2014-09-09: 3 mL via RESPIRATORY_TRACT
  Filled 2014-09-08: qty 3

## 2014-09-08 MED ORDER — MUPIROCIN 2 % EX OINT
TOPICAL_OINTMENT | CUTANEOUS | Status: AC
  Filled 2014-09-08: qty 22

## 2014-09-08 MED ORDER — SODIUM CHLORIDE 0.9 % IR SOLN
80.0000 mg | Status: AC
  Administered 2014-09-08: 80 mg
  Filled 2014-09-08: qty 2

## 2014-09-08 MED ORDER — ACETAMINOPHEN 325 MG PO TABS: 325.0000 mg | ORAL_TABLET | ORAL | Status: DC | PRN

## 2014-09-08 MED ORDER — BUPROPION HCL ER (SR) 150 MG PO TB12
150.0000 mg | ORAL_TABLET | Freq: Every day | ORAL | Status: DC
  Filled 2014-09-08 (×5): qty 1

## 2014-09-08 MED ORDER — MORPHINE SULFATE (PF) 2 MG/ML IV SOLN
INTRAVENOUS | Status: AC
  Administered 2014-09-08: 2 mg via INTRAVENOUS
  Filled 2014-09-08: qty 1

## 2014-09-08 MED ORDER — MIDAZOLAM HCL 5 MG/5ML IJ SOLN
INTRAMUSCULAR | Status: AC
  Filled 2014-09-08: qty 25

## 2014-09-08 MED ORDER — MELOXICAM 15 MG PO TABS
15.0000 mg | ORAL_TABLET | Freq: Every day | ORAL | Status: DC
  Administered 2014-09-09: 15 mg via ORAL
  Filled 2014-09-08 (×3): qty 1

## 2014-09-08 MED ORDER — ACETAMINOPHEN 500 MG PO TABS: 1000.0000 mg | ORAL_TABLET | Freq: Four times a day (QID) | ORAL | Status: DC | PRN

## 2014-09-08 MED ORDER — CEFAZOLIN SODIUM-DEXTROSE 2-3 GM-% IV SOLR
INTRAVENOUS | Status: DC | PRN
  Administered 2014-09-08: 2 g via INTRAVENOUS

## 2014-09-08 MED ORDER — MORPHINE SULFATE (PF) 2 MG/ML IV SOLN
INTRAVENOUS | Status: AC
  Administered 2014-09-08: 1 mg via INTRAVENOUS
  Filled 2014-09-08: qty 1

## 2014-09-08 MED ORDER — MORPHINE SULFATE (PF) 4 MG/ML IV SOLN
4.0000 mg | INTRAVENOUS | Status: DC | PRN
  Administered 2014-09-08 – 2014-09-10 (×4): 4 mg via INTRAVENOUS
  Filled 2014-09-08 (×4): qty 1

## 2014-09-08 MED ORDER — SODIUM CHLORIDE 0.9 % IV SOLN: INTRAVENOUS | Status: AC

## 2014-09-08 MED ORDER — CHLORHEXIDINE GLUCONATE 4 % EX LIQD
60.0000 mL | Freq: Once | CUTANEOUS | Status: DC
  Filled 2014-09-08: qty 60

## 2014-09-08 MED ORDER — LIDOCAINE HCL (PF) 1 % IJ SOLN
INTRAMUSCULAR | Status: AC
  Filled 2014-09-08: qty 60

## 2014-09-08 MED ORDER — ONDANSETRON HCL 4 MG/2ML IJ SOLN
4.0000 mg | Freq: Four times a day (QID) | INTRAMUSCULAR | Status: DC | PRN
  Administered 2014-09-09: 4 mg via INTRAVENOUS
  Filled 2014-09-08: qty 2

## 2014-09-08 MED ORDER — CEFAZOLIN SODIUM-DEXTROSE 2-3 GM-% IV SOLR
INTRAVENOUS | Status: AC
  Filled 2014-09-08: qty 50

## 2014-09-08 MED ORDER — LIDOCAINE HCL (PF) 1 % IJ SOLN
INTRAMUSCULAR | Status: AC
  Filled 2014-09-08: qty 30

## 2014-09-08 MED ORDER — EPINEPHRINE 0.3 MG/0.3ML IJ SOAJ
0.3000 mg | INTRAMUSCULAR | Status: DC | PRN
  Filled 2014-09-08: qty 0.6

## 2014-09-08 MED ORDER — OXYCODONE HCL 5 MG PO TABS
5.0000 mg | ORAL_TABLET | ORAL | Status: DC | PRN
  Administered 2014-09-08 – 2014-09-09 (×3): 5 mg via ORAL
  Filled 2014-09-08 (×3): qty 1

## 2014-09-08 MED ORDER — SODIUM CHLORIDE 0.9 % IR SOLN
Status: AC
  Filled 2014-09-08: qty 2

## 2014-09-08 SURGICAL SUPPLY — 12 items
CABLE SURGICAL S-101-97-12 (CABLE) ×3 IMPLANT
DEVICE DISSECT PLASMABLAD 3.0S (MISCELLANEOUS) ×1 IMPLANT
LEAD ISOFLEX OPT 1948-58CM (Lead) ×3 IMPLANT
LEAD TENDRIL SDX 1688TC-52CM (Lead) ×3 IMPLANT
PAD DEFIB LIFELINK (PAD) ×3 IMPLANT
PLASMABLADE 3.0S (MISCELLANEOUS) ×3
POUCH AIGIS-R ANTIBACT PPM (Mesh General) ×3 IMPLANT
PPM ASSURITY DR PM2240 (Pacemaker) ×3 IMPLANT
SHEATH CLASSIC 7F (SHEATH) ×6 IMPLANT
TOOL LEAD REMOVAL 4080 (MISCELLANEOUS) ×3 IMPLANT
TRAY PACEMAKER INSERTION (CUSTOM PROCEDURE TRAY) ×3 IMPLANT
WIRE HI TORQ VERSACORE-J 145CM (WIRE) ×3 IMPLANT

## 2014-09-08 NOTE — Progress Notes (Signed)
PATIENT C/O CHEST AND THROAT DISCOMFORT/PRESSURE.  EKG OBTAINED. 2L NASAL CANNULA APPLIED. 4MG  MORPHINE AND SL NITRO ADMINISTERED.  VSS.   LEFT CHEST/PACEMAKER INSERTION SITE STILL VERY SWOLLEN AND TENDER. BULKY, COMPRESSION DRESSING CLEAN, DRY, AND INTACT.  WILL CONTINUE TO MONITOR.

## 2014-09-08 NOTE — Progress Notes (Signed)
LEFT ARM SLING ADJUSTED. PATIENT CONTINUES TO BE NON-COMPLIANT WITH INSTRUCTIONS TO AVOID USE/MOVEMENT OF LEFT ARM/HAND.  FAMILY AT BEDSIDE FOR ASSISTANCE AND REMINDERS.

## 2014-09-08 NOTE — Progress Notes (Signed)
Patient stable for now. Still rates pain a 7/10.  Cardiology notified (Dr. Tresa Endo).

## 2014-09-08 NOTE — H&P (Signed)
      Patient Care Team: Tammy Eartha Inch, MD as PCP - General (Family Medicine)   HPI  Mario Wyatt is a 24 y.o. male with pacemaker implanted for high-grade heart block at the age of 24 years old. 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 He underwent generator replacement in 2008 receiving a St. Jude device. Initial implant was Intermedics lead implanted in 1999. He has had no syncope. He has no limitations exercise tolerance.  His initial implantation with subpectoral. Records and Results Reviewed   Past Medical History  Diagnosis Date  . Asthma   . Attention deficit hyperactivity disorder (ADHD)   . Anxiety   . Pacemaker     Placed when pt was 24 years old.    Past Surgical History  Procedure Laterality Date  . Cardiac pacemaker placement    . Pacemaker insertion  2001    second degree heart block  . Hernia repair      Current Facility-Administered Medications  Medication Dose Route Frequency Provider Last Rate Last Dose  . 0.9 %  sodium chloride infusion   Intravenous Continuous Marily Lente, NP 50 mL/hr at 09/08/14 0813    . ceFAZolin (ANCEF) IVPB 2 g/50 mL premix  2 g Intravenous On Call Amber Caryl Bis, NP      . chlorhexidine (HIBICLENS) 4 % liquid 4 application  60 mL Topical Once Amber Caryl Bis, NP      . gentamicin (GARAMYCIN) 80 mg in sodium chloride irrigation 0.9 % 500 mL irrigation  80 mg Irrigation To Cath Amber Caryl Bis, NP      . mupirocin ointment (BACTROBAN) 2 %             Allergies  Allergen Reactions  . Iodine Anaphylaxis  . Shellfish Allergy Anaphylaxis  . Latex Hives and Rash      Review of Systems negative except from HPI and PMH  Physical Exam BP 123/72 mmHg  Pulse 65  Temp(Src) 97.8 F (36.6 C) (Oral)  Resp 18  Ht 5\' 4"  (1.626 m)  Wt 128 lb (58.06 kg)  BMI 21.96 kg/m2  SpO2 100% Well developed and well nourished in no acute distress HENT normal E scleral and icterus clear Neck  Supple Device pocket well healed; without hematoma or erythema.  There is no tethering  The incision is over the clavicle JVP flat; carotids brisk and full Clear to ausculation  Regular rate and rhythm, no murmurs gallops or rub Soft with active bowel sounds No clubbing cyanosis  Edema Alert and oriented, grossly normal motor and sensory function Skin Warm and Dry    Assessment and  Plan  Complete heart block  Pacemaker with serpentine lead deployment within the pocket    Will proceed with device generator replacement   Will try and get access to antimicrobial pouch

## 2014-09-09 ENCOUNTER — Encounter (HOSPITAL_COMMUNITY): Payer: Self-pay | Admitting: Cardiology

## 2014-09-09 ENCOUNTER — Ambulatory Visit (HOSPITAL_COMMUNITY): Payer: Medicaid Other

## 2014-09-09 DIAGNOSIS — Q246 Congenital heart block: Secondary | ICD-10-CM

## 2014-09-09 DIAGNOSIS — Z45010 Encounter for checking and testing of cardiac pacemaker pulse generator [battery]: Secondary | ICD-10-CM | POA: Diagnosis not present

## 2014-09-09 DIAGNOSIS — Z950 Presence of cardiac pacemaker: Secondary | ICD-10-CM | POA: Diagnosis not present

## 2014-09-09 HISTORY — DX: Presence of cardiac pacemaker: Z95.0

## 2014-09-09 NOTE — Progress Notes (Signed)
Pt profile: 24 y.o. Male with pacemaker implanted for high-grade heart block at the age of 24 years old. 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 He underwent generator replacement in 2008 receiving a St. Jude device. Initial implant was Intermedics lead implanted in 1999. He has had no syncope. He has had no limitations exercise tolerance. Admitted with gen change due to ERI. A successful implantation of a St Jude Medical Assurity DR dual-chamber pacemaker with new leads for symptomatic bradycardia/CHB at age 52.  Subjective: No complaints  Objective: Vital signs in last 24 hours: Temp:  [98.2 F (36.8 C)-98.9 F (37.2 C)] 98.9 F (37.2 C) (09/08 0548) Pulse Rate:  [58-91] 59 (09/08 0548) Resp:  [18-19] 18 (09/08 0548) BP: (102-124)/(57-81) 102/65 mmHg (09/08 0548) SpO2:  [96 %-100 %] 100 % (09/08 0548) Weight change:  Last BM Date: 09/07/14 Intake/Output from previous day: -160 09/07 0701 - 09/08 0700 In: 240 [P.O.:240] Out: 400 [Urine:400] Intake/Output this shift:    PE: exam per MD General:Pleasant affect, NAD Skin:Warm and dry, brisk capillary refill HEENT:normocephalic, sclera clear, mucus membranes moist Heart:S1S2 RRR without murmur, gallup, rub or click Lungs:clear without rales, rhonchi, or wheezes ZOX:WRUE, non tender, + BS, do not palpate liver spleen or masses Ext:no lower ext edema, 2+ pedal pulses, 2+ radial pulses Neuro:alert and oriented, MAE, follows commands, + facial symmetry  tele: pacing   Lab Results: No results for input(s): WBC, HGB, HCT, PLT in the last 72 hours. BMET No results for input(s): NA, K, CL, CO2, GLUCOSE, BUN, CREATININE, CALCIUM in the last 72 hours.  Invalid input(s): MAGNESIUM No results for input(s): TROPONINI in the last 72 hours.  Invalid input(s): CK, MB  No results found for: CHOL, HDL, LDLCALC, LDLDIRECT, TRIG, CHOLHDL No results found for: AVWU9W   Lab Results  Component Value  Date   TSH 0.801 10/25/2009         Studies/Results: Dg Chest 2 View  09/09/2014   CLINICAL DATA:  Status post pacemaker placement. Shortness of breath.  EXAM: CHEST  2 VIEW  COMPARISON:  May 28, 2013.  FINDINGS: The heart size and mediastinal contours are within normal limits. Both lungs are clear. No pneumothorax or pleural effusion is noted. The visualized skeletal structures are unremarkable. Left-sided pacemaker is in grossly good position.  IMPRESSION: No active cardiopulmonary disease.   Electronically Signed   By: Lupita Raider, M.D.   On: 09/09/2014 07:34    Medications: I have reviewed the patient's current medications. Scheduled Meds: . buPROPion  150 mg Oral Daily  . feeding supplement (ENSURE ENLIVE)  237 mL Oral BID BM  . meloxicam  15 mg Oral Daily  . traMADol  50 mg Oral Daily   Continuous Infusions:  PRN Meds:.acetaminophen, albuterol, diphenhydrAMINE, EPINEPHrine, HYDROcodone-acetaminophen, morphine injection, ondansetron (ZOFRAN) IV, oxyCODONE  Assessment/Plan: Principal Problem:   Pacemaker at end of battery life Active Problems:   S/P cardiac pacemaker procedure, 09/08/14, St Jude medical, gen change   Complete heart block   Pacemaker   AV block, High Grade, congenital  Pt with pain from procedure will keep today with plan for discharge tomorrow, see Dr. Elberta Fortis note. He does have vicodin ordered.     Time spent with pt. :15 minutes. [facility]  Nurse Practitioner Certified Pager [phone] or after 5pm and on weekends call [phone] 09/09/2014, 2:55 PM   I have seen and examined this patient with Nada Boozer.  Agree with above, note  added to reflect my findings.  On exam, regular rhythm, no murmurs.  Hematoma overlying the left pacemaker pocket which is painful.  Patient had pacemaker placed yesterday with large hematoma at the site.  Pressure dressing applied which has since stopped the bleeding and reduced the size of the hematoma.  Patient with  significant pain.  Will continue to monitor the hematoma one more day to ensure no further bleeding and safe to return home.    Will M. Camnitz MD 09/09/2014 3:36 PM

## 2014-09-09 NOTE — Discharge Instructions (Signed)
° ° °  Supplemental Discharge Instructions for  Pacemaker Patients  Activity No heavy lifting or vigorous activity with your left/right arm for 6 to 8 weeks.  Do not raise your left/right arm above your head for one week.  Gradually raise your affected arm as drawn below.           __  09/10/14                       09/12/14                         09/14/14                   09/16/14  NO DRIVING for     ; you may begin driving on     .  WOUND CARE - Keep the wound area clean and dry.  Do not get this area wet for one week. No showers for one week; you may shower on 09/15/14    . - The tape/steri-strips on your wound will fall off; do not pull them off.  No bandage is needed on the site.  DO  NOT apply any creams, oils, or ointments to the wound area. - If you notice any drainage or discharge from the wound, any swelling or bruising at the site, or you develop a fever > 101? F after you are discharged home, call the office at once.  Special Instructions - You are still able to use cellular telephones; use the ear opposite the side where you have your pacemaker/defibrillator.  Avoid carrying your cellular phone near your device. - When traveling through airports, show security personnel your identification card to avoid being screened in the metal detectors.  Ask the security personnel to use the hand wand. - Avoid arc welding equipment, MRI testing (magnetic resonance imaging), TENS units (transcutaneous nerve stimulators).  Call the office for questions about other devices. - Avoid electrical appliances that are in poor condition or are not properly grounded. - Microwave ovens are safe to be near or to operate.

## 2014-09-10 DIAGNOSIS — Z45010 Encounter for checking and testing of cardiac pacemaker pulse generator [battery]: Secondary | ICD-10-CM | POA: Diagnosis not present

## 2014-09-10 MED ORDER — HYDROCODONE-ACETAMINOPHEN 5-325 MG PO TABS: 1.0000 | ORAL_TABLET | ORAL | Status: DC | PRN

## 2014-09-10 NOTE — Progress Notes (Signed)
Pt stable for discharge.  Discharge instructions and prescription for vicodin given to patient.  All questions answered.  Pt stated his understanding.

## 2014-09-10 NOTE — Progress Notes (Signed)
       Pt profile: 24 y.o. Male with pacemaker implanted for high-grade heart block at the age of 24 years old. 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 He underwent generator replacement in 2008 receiving a St. Jude device. Initial implant was Intermedics lead implanted in 1999. He has had no syncope. He has had no limitations exercise tolerance. Admitted with gen change due to ERI. A successful implantation of a St Jude Medical Assurity DR dual-chamber pacemaker with new leads for symptomatic bradycardia/CHB at age 64 secondary to double lead failure  Subjective: Moderate incisional pain   Objective: Vital signs in last 24 hours: Temp:  [98.7 F (37.1 C)-99.1 F (37.3 C)] 99.1 F (37.3 C) (09/09 0443) Pulse Rate:  [71-72] 72 (09/09 0443) Resp:  [18] 18 (09/09 0443) BP: (106-114)/(47-59) 106/47 mmHg (09/09 0443) SpO2:  [96 %-97 %] 96 % (09/09 0443) Weight change:  Last BM Date: 09/08/14 Intake/Output from previous day: -160   Intake/Output this shift:    PE: exam per MD General:Pleasant affect, NAD Skin:Warm and dry, brisk capillary refill HEENT:normocephalic, sclera clear, mucus membranes moist Heart:S1S2 RRR without murmur, gallup, rub or click Pocket with hematoma  swelling and tenderness  Lungs:clear without rales, rhonchi, or wheezes EYE:MVVK, non tender, + BS, do not palpate liver spleen or masses Ext:no lower ext edema, 2+ pedal pulses, 2+ radial pulses Neuro:alert and oriented, MAE, follows commands, + facial symmetry  tele: pacing   Lab Results: No results for input(s): WBC, HGB, HCT, PLT in the last 72 hours. BMET No results for input(s): NA, K, CL, CO2, GLUCOSE, BUN, CREATININE, CALCIUM in the last 72 hours.  Invalid input(s): MAGNESIUM No results for input(s): TROPONINI in the last 72 hours.  Invalid input(s): CK, MB  No results found for: CHOL, HDL, LDLCALC, LDLDIRECT, TRIG, CHOLHDL No results found for: PQAE4L   Lab  Results  Component Value Date   TSH 0.801 10/25/2009         Studies/Results: Dg Chest 2 View  09/09/2014   CLINICAL DATA:  Status post pacemaker placement. Shortness of breath.  EXAM: CHEST  2 VIEW  COMPARISON:  May 28, 2013.  FINDINGS: The heart size and mediastinal contours are within normal limits. Both lungs are clear. No pneumothorax or pleural effusion is noted. The visualized skeletal structures are unremarkable. Left-sided pacemaker is in grossly good position.  IMPRESSION: No active cardiopulmonary disease.   Electronically Signed   By: Lupita Raider, M.D.   On: 09/09/2014 07:34    Medications: I have reviewed the patient's current medications. Scheduled Meds: . buPROPion  150 mg Oral Daily  . feeding supplement (ENSURE ENLIVE)  237 mL Oral BID BM  . meloxicam  15 mg Oral Daily  . traMADol  50 mg Oral Daily   Continuous Infusions:  PRN Meds:.acetaminophen, albuterol, diphenhydrAMINE, EPINEPHrine, HYDROcodone-acetaminophen, morphine injection, ondansetron (ZOFRAN) IV, oxyCODONE  Assessment/Plan: Principal Problem:   Pacemaker at end of battery life Active Problems:   Pacemaker   AV block, High Grade, congenital   Complete heart block   S/P cardiac pacemaker procedure, 09/08/14, St Jude medical, gen change    Will repapply pressure dressing and discharge will need followup on MOnday Papers sighned for work Will need pain meds at discharge  vicodin 5/325 Disp 12 NO REFILL

## 2014-09-10 NOTE — Discharge Summary (Signed)
Physician Discharge Summary  Patient ID: Mario Wyatt MRN: [id number] DOB/AGE: October 19, 1990 24 y.o.  Admit date: 09/08/2014 Discharge date: 09/10/2014  Admission Diagnoses: Pacemaker at End of Battery Life  Discharge Diagnoses:  Principal Problem:   Pacemaker at end of battery life Active Problems:   Pacemaker   AV block, High Grade, congenital   Complete heart block   S/P cardiac pacemaker procedure, 09/08/14, St Jude medical, gen change   Discharged Condition: stable  Hospital Course: Mario Wyatt is a 24 y.o. Male with pacemaker implanted for high-grade heart block at the age of 24 years old. 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 He underwent generator replacement in 2008 receiving a St. Jude device. He was recently seen in EP clinic and device interrogation revealed that he had reached ERI.   He presented to [facility] on 09/08/14 for a generator change. The procedure was performed by Dr. Graciela Husbands and Dr. Elberta Fortis. He underwent successful generator change. However, post operatively he developed a pocket hematoma. Pressure dressing was applied which stopped the bleeding and reduced the size of the hematoma. He was monitored an additional night and remained stable. He was last seen and examined by Dr. Graciela Husbands who determined he was stable for discharge home. Vicodin was prescribed for pain. #12. No refills. He will f/u in the office on 09/13/14 to reasses is device pocket.   Consults: None    Treatments: See Hospital Course.   Discharge Exam: Blood pressure 106/47, pulse 72, temperature 99.1 F (37.3 C), temperature source Oral, resp. rate 18, height 5\' 4"  (1.626 m), weight 128 lb (58.06 kg), SpO2 96 %.  Disposition: 01-Home or Self Care  Discharge Instructions    Diet - low sodium heart healthy    Complete by:  As directed      Increase activity slowly    Complete by:  As directed             Medication List    TAKE these medications        acetaminophen 500 MG tablet  Commonly known as:  TYLENOL  Take 1,000 mg by mouth every 6 (six) hours as needed for moderate pain or headache.     albuterol 108 (90 BASE) MCG/ACT inhaler  Commonly known as:  PROVENTIL HFA;VENTOLIN HFA  Inhale 2 puffs into the lungs every 4 (four) hours as needed for wheezing or shortness of breath.     buPROPion 150 MG 12 hr tablet  Commonly known as:  WELLBUTRIN SR  Take 150 mg by mouth daily.     EPINEPHrine 0.3 mg/0.3 mL Soaj injection  Commonly known as:  EPIPEN 2-PAK  Inject 0.3 mLs (0.3 mg total) into the muscle once.     HYDROcodone-acetaminophen 5-325 MG per tablet  Commonly known as:  NORCO/VICODIN  Take 1-2 tablets by mouth every 4 (four) hours as needed for moderate pain.     meloxicam 15 MG tablet  Commonly known as:  MOBIC  Take 15 mg by mouth daily.     traMADol 50 MG tablet  Commonly known as:  ULTRAM  Take 50 mg by mouth daily.           Follow-up Information    Follow up with [facility] On 09/23/2014.   Specialty:  Cardiology   Why:  at 9:00AM   Contact information:   [address] [place] [address] [phone]      Follow up with Sherryl Manges, MD  On 11/05/2014.   Specialty:  Cardiology   Why:  at 1:45 PM   Contact information:   [address] [place] [address] [phone]       Follow up with [facility] On 09/13/2014.   Why:  12:00 pm , For wound re-check   Contact information:   [address] [place] [address]      TIME SPENT ON DISCHARGE, INCLUDING PHYSICIAN TIME: >30 MINUTES  Signed: Robbie Lis 09/10/2014, 1:26 PM

## 2014-09-13 ENCOUNTER — Emergency Department (HOSPITAL_COMMUNITY)
Admission: EM | Admit: 2014-09-13 | Discharge: 2014-09-13 | Disposition: A | Discharge status: Home / Self Care | Payer: Medicaid Other | Attending: Emergency Medicine | Admitting: Emergency Medicine

## 2014-09-13 ENCOUNTER — Ambulatory Visit: Payer: Self-pay

## 2014-09-13 ENCOUNTER — Emergency Department (HOSPITAL_COMMUNITY): Payer: Medicaid Other

## 2014-09-13 ENCOUNTER — Encounter (HOSPITAL_COMMUNITY): Payer: Self-pay | Admitting: *Deleted

## 2014-09-13 DIAGNOSIS — Z950 Presence of cardiac pacemaker: Secondary | ICD-10-CM | POA: Insufficient documentation

## 2014-09-13 DIAGNOSIS — S4992XA Unspecified injury of left shoulder and upper arm, initial encounter: Secondary | ICD-10-CM | POA: Insufficient documentation

## 2014-09-13 DIAGNOSIS — Z79899 Other long term (current) drug therapy: Secondary | ICD-10-CM | POA: Insufficient documentation

## 2014-09-13 DIAGNOSIS — F909 Attention-deficit hyperactivity disorder, unspecified type: Secondary | ICD-10-CM | POA: Insufficient documentation

## 2014-09-13 DIAGNOSIS — Z87891 Personal history of nicotine dependence: Secondary | ICD-10-CM | POA: Diagnosis not present

## 2014-09-13 DIAGNOSIS — Z9889 Other specified postprocedural states: Secondary | ICD-10-CM | POA: Diagnosis not present

## 2014-09-13 DIAGNOSIS — F419 Anxiety disorder, unspecified: Secondary | ICD-10-CM | POA: Insufficient documentation

## 2014-09-13 DIAGNOSIS — Y92410 Unspecified street and highway as the place of occurrence of the external cause: Secondary | ICD-10-CM | POA: Insufficient documentation

## 2014-09-13 DIAGNOSIS — S299XXA Unspecified injury of thorax, initial encounter: Secondary | ICD-10-CM | POA: Diagnosis not present

## 2014-09-13 DIAGNOSIS — Y939 Activity, unspecified: Secondary | ICD-10-CM | POA: Diagnosis not present

## 2014-09-13 DIAGNOSIS — Z041 Encounter for examination and observation following transport accident: Secondary | ICD-10-CM

## 2014-09-13 DIAGNOSIS — Y999 Unspecified external cause status: Secondary | ICD-10-CM | POA: Diagnosis not present

## 2014-09-13 DIAGNOSIS — J45909 Unspecified asthma, uncomplicated: Secondary | ICD-10-CM | POA: Insufficient documentation

## 2014-09-13 DIAGNOSIS — Z91040 Latex allergy status: Secondary | ICD-10-CM | POA: Insufficient documentation

## 2014-09-13 DIAGNOSIS — Z791 Long term (current) use of non-steroidal anti-inflammatories (NSAID): Secondary | ICD-10-CM | POA: Diagnosis not present

## 2014-09-13 DIAGNOSIS — Z043 Encounter for examination and observation following other accident: Secondary | ICD-10-CM

## 2014-09-13 MED ORDER — OXYCODONE-ACETAMINOPHEN 5-325 MG PO TABS: 1.0000 | ORAL_TABLET | ORAL | Status: DC | PRN

## 2014-09-13 MED ORDER — OXYCODONE-ACETAMINOPHEN 5-325 MG PO TABS
1.0000 | ORAL_TABLET | Freq: Once | ORAL | Status: AC
  Administered 2014-09-13: 2 via ORAL
  Filled 2014-09-13: qty 2
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
## 2014-09-13 NOTE — ED Notes (Addendum)
Pt here for pain at incision site. Pt recent pacemaker insertion. VS stable
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
## 2014-09-16 ENCOUNTER — Telehealth: Payer: Self-pay | Admitting: Internal Medicine

## 2014-09-16 NOTE — Telephone Encounter (Signed)
Appt made for 09/17/14 @ 1400 w/Device Clinic.

## 2014-09-16 NOTE — Telephone Encounter (Signed)
New Message        Pt calling stating that his bandage is coming off and wants to know if he should re bandage the device site. Pt also states he needs help setting up his monitor. Please call back and advise.

## 2014-09-17 ENCOUNTER — Ambulatory Visit (HOSPITAL_COMMUNITY)
Admission: RE | Admit: 2014-09-17 | Discharge: 2014-09-17 | Disposition: A | Discharge status: Home / Self Care | Payer: Medicaid Other | Source: Ambulatory Visit | Attending: Internal Medicine | Admitting: Internal Medicine

## 2014-09-17 ENCOUNTER — Encounter: Payer: Self-pay | Admitting: Cardiovascular Disease

## 2014-09-17 ENCOUNTER — Ambulatory Visit (INDEPENDENT_AMBULATORY_CARE_PROVIDER_SITE_OTHER): Payer: Medicaid Other | Admitting: *Deleted

## 2014-09-17 DIAGNOSIS — Z950 Presence of cardiac pacemaker: Secondary | ICD-10-CM | POA: Diagnosis not present

## 2014-09-17 DIAGNOSIS — I442 Atrioventricular block, complete: Secondary | ICD-10-CM | POA: Diagnosis not present

## 2014-09-17 DIAGNOSIS — Q246 Congenital heart block: Secondary | ICD-10-CM | POA: Diagnosis not present

## 2014-09-17 DIAGNOSIS — R5383 Other fatigue: Secondary | ICD-10-CM | POA: Insufficient documentation

## 2014-09-17 DIAGNOSIS — R000 Tachycardia, unspecified: Secondary | ICD-10-CM | POA: Insufficient documentation

## 2014-09-17 DIAGNOSIS — T82110A Breakdown (mechanical) of cardiac electrode, initial encounter: Secondary | ICD-10-CM | POA: Diagnosis present

## 2014-09-17 LAB — CUP PACEART INCLINIC DEVICE CHECK
Battery Remaining Longevity: 91.2 mo
Battery Voltage: 3.04 V
Brady Statistic RA Percent Paced: 24 %
Brady Statistic RV Percent Paced: 99.66 %
Date Time Interrogation Session: 20160916175129
Lead Channel Impedance Value: 425 Ohm
Lead Channel Sensing Intrinsic Amplitude: 11.8 mV
Lead Channel Setting Pacing Amplitude: 3.5 V
Lead Channel Setting Pacing Pulse Width: 0.4 ms
MDC IDC MSMT LEADCHNL RA SENSING INTR AMPL: 0.6 mV
MDC IDC MSMT LEADCHNL RV IMPEDANCE VALUE: 700 Ohm
MDC IDC MSMT LEADCHNL RV PACING THRESHOLD AMPLITUDE: 0.5 V
MDC IDC MSMT LEADCHNL RV PACING THRESHOLD PULSEWIDTH: 0.4 ms
MDC IDC SET LEADCHNL RV SENSING SENSITIVITY: 4 mV
Pulse Gen Serial Number: 7787423

## 2014-09-17 NOTE — Progress Notes (Signed)
Wound check appointment. Pressure dressing and steri-strips removed. Ecchymosis noted but is appropriately changing to yellow/green; hematoma soft to palpation. RV thresholds, sensing, impedances consistent with implant measurements. RA impedance stable, sensing decreased from 1.16mV on 09/09/14 to 0.48mV this session; questionable capture noted during RA threshold testing from 3.5V. Reviewed with GT, who recommended reprogramming to VDD, chest x-ray ordered, RV output maintained at 3.5V for extra safety margin. No mode switches or high ventricular rates noted. Estimated longevity 7.6-9.1 years. Patient educated about wound care, arm mobility, lifting restrictions. ROV with SK on 09/20/14 at 8:30am.

## 2014-09-20 ENCOUNTER — Ambulatory Visit (INDEPENDENT_AMBULATORY_CARE_PROVIDER_SITE_OTHER): Payer: Medicaid Other | Admitting: Internal Medicine

## 2014-09-20 ENCOUNTER — Ambulatory Visit: Payer: Self-pay

## 2014-09-20 ENCOUNTER — Encounter: Payer: Self-pay | Admitting: Internal Medicine

## 2014-09-20 VITALS — BP 116/80 | HR 67 | Ht 63.0 in | Wt 118.5 lb

## 2014-09-20 DIAGNOSIS — Z950 Presence of cardiac pacemaker: Secondary | ICD-10-CM

## 2014-09-20 DIAGNOSIS — Q246 Congenital heart block: Secondary | ICD-10-CM

## 2014-09-20 LAB — CUP PACEART INCLINIC DEVICE CHECK
Date Time Interrogation Session: 20160919102224
Lead Channel Impedance Value: 450 Ohm
Lead Channel Impedance Value: 700 Ohm
Lead Channel Pacing Threshold Pulse Width: 0.4 ms
Lead Channel Sensing Intrinsic Amplitude: 11.1 mV
Lead Channel Setting Pacing Amplitude: 3.5 V
Lead Channel Setting Sensing Sensitivity: 4 mV
MDC IDC MSMT BATTERY REMAINING LONGEVITY: 60 mo
MDC IDC MSMT BATTERY VOLTAGE: 3.04 V
MDC IDC MSMT LEADCHNL RA PACING THRESHOLD AMPLITUDE: 2.5 V
MDC IDC MSMT LEADCHNL RA PACING THRESHOLD PULSEWIDTH: 0.3 ms
MDC IDC MSMT LEADCHNL RA SENSING INTR AMPL: 0.9 mV
MDC IDC MSMT LEADCHNL RV PACING THRESHOLD AMPLITUDE: 0.5 V
MDC IDC SET LEADCHNL RV PACING AMPLITUDE: 3.5 V
MDC IDC SET LEADCHNL RV PACING PULSEWIDTH: 0.4 ms
MDC IDC STAT BRADY RA PERCENT PACED: 0 %
MDC IDC STAT BRADY RV PERCENT PACED: 99.84 %
Pulse Gen Serial Number: 7787423

## 2014-09-20 NOTE — Patient Instructions (Signed)
Medication Instructions: - no changes  Labwork: - none  Procedures/Testing: - none  Follow-Up: - Your physician recommends that you schedule a follow-up appointment in: 2 weeks with Gypsy Balsam, NP  - Your physician recommends that you schedule a follow-up appointment in: 3 months with Gypsy Balsam, NP or Dr. Graciela Husbands.  Any Additional Special Instructions Will Be Listed Below (If Applicable). - none

## 2014-09-20 NOTE — Progress Notes (Signed)
Patient Care Team: Tammy Eartha Inch, MD as PCP - General (Family Medicine)   HPI  Mario Wyatt is a 24 y.o. male Is seen to establish care for pacemaker implanted for high-grade heart block at the age of 24 years old. 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 He underwent generator replacement in 2008 receiving a St. Jude device. Reason for Intermedics lead implanted in 1999. He has had no syncope. He has no limitations exercise tolerance.  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   His initial implant was subpectoral.   His hange out procedure was complicated by fracture of the atrial lead revision and separation of the RV pin./Lead  he required 2 new leads. His complaint now is that he shoulder hurts    Past Medical History  Diagnosis Date  . Asthma   . Attention deficit hyperactivity disorder (ADHD)   . Anxiety   . Pacemaker     Placed when pt was 24 years old.  . Congenital complete AV block   . S/P cardiac pacemaker procedure, 09/08/14, St Jude medical 09/09/2014    Past Surgical History  Procedure Laterality Date  . Cardiac pacemaker placement    . Pacemaker insertion  2001    second degree heart block  . Hernia repair    . Ep implantable device N/A 09/08/2014    Procedure: Pacemaker Implant;  Surgeon: Duke Salvia, MD;  Location: [facility] INVASIVE CV LAB;  Service: Cardiovascular;  Laterality: N/A;    Current Outpatient Prescriptions  Medication Sig Dispense Refill  . acetaminophen (TYLENOL) 500 MG tablet Take 1,000 mg by mouth every 6 (six) hours as needed for moderate pain or headache.     . albuterol (PROVENTIL HFA;VENTOLIN HFA) 108 (90 BASE) MCG/ACT inhaler Inhale 2 puffs into the lungs every 4 (four) hours as needed for wheezing or shortness of breath. 1 Inhaler 0  . buPROPion (WELLBUTRIN SR) 150 MG 12 hr tablet Take 150 mg by mouth daily.    Marland Kitchen EPINEPHrine (EPIPEN 2-PAK) 0.3 mg/0.3 mL IJ SOAJ injection  Inject 0.3 mLs (0.3 mg total) into the muscle once. 2 Device 0  . HYDROcodone-acetaminophen (NORCO/VICODIN) 5-325 MG per tablet Take 1-2 tablets by mouth every 4 (four) hours as needed for moderate pain. 12 tablet 0  . meloxicam (MOBIC) 15 MG tablet Take 15 mg by mouth daily.    Marland Kitchen oxyCODONE-acetaminophen (PERCOCET/ROXICET) 5-325 MG per tablet Take 1 tablet by mouth every 4 (four) hours as needed for severe pain. May take 2 tablets PO q 6 hours for severe pain - Do not take with Tylenol as this tablet already contains tylenol 15 tablet 0  . traMADol (ULTRAM) 50 MG tablet Take 50 mg by mouth daily.    . [DISCONTINUED] ALBUTEROL IN Inhale into the lungs as needed.      . [DISCONTINUED] methylphenidate (CONCERTA) 36 MG CR tablet Take 36 mg by mouth every morning.      . [DISCONTINUED] sertraline (ZOLOFT) 50 MG tablet Take 50 mg by mouth daily.       No current facility-administered medications for this visit.   Soc hx  He is working somoking some and drinking some   He has an identical twin brother    Review of Systems negative except from HPI and PMH  Physical Exam BP 116/80 mmHg  Pulse 67  Ht  (1.6 m)  Wt 118 lb 8 oz (53.751 kg)  BMI 21.00  kg/m2 Well developed and well nourished in no acute distress HENT normal E scleral and icterus clear Neck Supple JVP flat; carotids brisk and full Clear to ausculation Pocket with resolving hematoma,green and purple with swelling or tenderness  Regular rate and rhythm, no murmurs gallops or rub Soft with active bowel sounds No clubbing cyanosis  Edema Alert and oriented, grossly normal motor and sensory function Skin Warm and Dry  ECG demonstrates P-synchronous/ AV  pacing  Assessment and  Plan  High Grade Heart Block  Pacemaker  St Jude  Adhesive capsulitis   The patient hematoma is resolving. He has significant limitation left shoulder motion. We have discussed the importance of physical therapy. He is to return in 2 weeks. If  there is not significant improvement I he is not able to lift his hand least 45 above the parallel I would refer him for physical therapy.  Pocket hematoma is resolving

## 2014-09-21 ENCOUNTER — Encounter: Payer: Self-pay | Admitting: Cardiovascular Disease

## 2014-09-22 ENCOUNTER — Telehealth: Payer: Self-pay | Admitting: Cardiology

## 2014-09-22 ENCOUNTER — Telehealth: Payer: Self-pay | Admitting: Internal Medicine

## 2014-09-22 NOTE — Telephone Encounter (Signed)
New problem   Pt want to know if his transmission went through.

## 2014-09-22 NOTE — Telephone Encounter (Signed)
Pt called in and stated that he got up this morning at 8:00 and went to the mailbox and back and didn't feel good so he laid back down and he tried getting up at 10 AM but couldn't and again at 12 noon, he was finally able to get up at [facility]. He stated that he still doesn't feel good, really fatigued. Pt send remote transmission and is aware that device tech will call back once transmission is received.

## 2014-09-22 NOTE — Telephone Encounter (Signed)
Remote received. All measurements & diagnostics consistent w/ previous two OVs. I advised pt to contact his PCP per his discretion based on symptoms. At this time, I am not convinced his symptoms align w/ a ppm function. Most recent CXR on 09/17/14 does not show RA lead not macrodislodgement.   Pt aware ROV w/ Amber 10/06/14.

## 2014-09-29 DIAGNOSIS — Z91013 Allergy to seafood: Secondary | ICD-10-CM | POA: Insufficient documentation

## 2014-09-29 DIAGNOSIS — R5383 Other fatigue: Secondary | ICD-10-CM | POA: Insufficient documentation

## 2014-10-04 ENCOUNTER — Encounter: Payer: Self-pay | Admitting: *Deleted

## 2014-10-06 ENCOUNTER — Encounter: Payer: Self-pay | Admitting: *Deleted

## 2014-10-06 ENCOUNTER — Ambulatory Visit (INDEPENDENT_AMBULATORY_CARE_PROVIDER_SITE_OTHER): Payer: Medicaid Other | Admitting: Nurse Practitioner

## 2014-10-06 ENCOUNTER — Encounter: Payer: Self-pay | Admitting: Nurse Practitioner

## 2014-10-06 VITALS — BP 93/61 | HR 109 | Ht 63.0 in | Wt 117.0 lb

## 2014-10-06 DIAGNOSIS — I442 Atrioventricular block, complete: Secondary | ICD-10-CM | POA: Diagnosis not present

## 2014-10-06 DIAGNOSIS — R000 Tachycardia, unspecified: Secondary | ICD-10-CM | POA: Diagnosis not present

## 2014-10-06 LAB — CBC
HEMATOCRIT: 43.1 % (ref 39.0–52.0)
Hemoglobin: 14.7 g/dL (ref 13.0–17.0)
MCH: 29.6 pg (ref 26.0–34.0)
MCHC: 34.1 g/dL (ref 30.0–36.0)
MCV: 86.9 fL (ref 78.0–100.0)
MPV: 9.6 fL (ref 8.6–12.4)
Platelets: 226 10*3/uL (ref 150–400)
RBC: 4.96 MIL/uL (ref 4.22–5.81)
RDW: 13.8 % (ref 11.5–15.5)
WBC: 5.3 10*3/uL (ref 4.0–10.5)

## 2014-10-06 LAB — TSH: TSH: 0.509 u[IU]/mL (ref 0.350–4.500)

## 2014-10-06 NOTE — Patient Instructions (Addendum)
Medication Instructions:   Your physician recommends that you continue on your current medications as directed. Please refer to the Current Medication list given to you today.   Labwork:  CBC AND TSH  Testing/Procedures:  NONE ORDER TODAY   Follow-Up:   WITH DR Graciela Husbands IN 6 TO 8 WEEKS PHYS PACER CK  Any Other Special Instructions Will Be Listed Below (If Applicable).

## 2014-10-06 NOTE — Progress Notes (Signed)
Electrophysiology Office Note Date: 10/06/2014  ID:  Romane Wyatt, DOB Mar 24, 1990, MRN [id number]  PCP: Verlon Au, MD Electrophysiologist: Graciela Husbands  CC: Pacemaker follow-up  Mario Wyatt is a 24 y.o. male seen today for Dr Graciela Husbands.  He underwent generator change last month with fracture of both RA and RV leads with new RA and RV lead placement.  Post device implant, he developed pocket hematoma and presents today for further evaluation. Since last being seen in our clinic, the patient reports doing very well.  His hematoma has resolved and his left arm range of motion is improved. He asks about when he can go back to work.  He denies chest pain, palpitations, dyspnea, PND, orthopnea, nausea, vomiting, dizziness, syncope.  Device History: STJ dual chamber PPM implanted 1999 for complete heart block, last gen change 2008; gen change 2016 with new RA and RV leads 2/2 lead fractures   Past Medical History  Diagnosis Date  . Asthma   . Attention deficit hyperactivity disorder (ADHD)   . Anxiety   . Pacemaker     Placed when pt was 25 years old.  . Congenital complete AV block   . S/P cardiac pacemaker procedure, 09/08/14, St Jude medical 09/09/2014   Past Surgical History  Procedure Laterality Date  . Cardiac pacemaker placement  2008    second degree heart block  . Pacemaker insertion  1999  . Hernia repair    . Ep implantable device N/A 09/08/2014    Procedure: Pacemaker Implant;  Surgeon: Duke Salvia, MD;  Location: [facility] INVASIVE CV LAB;  Service: Cardiovascular;  Laterality: N/A;    Current Outpatient Prescriptions  Medication Sig Dispense Refill  . acetaminophen (TYLENOL) 500 MG tablet Take 1,000 mg by mouth every 6 (six) hours as needed for moderate pain or headache.     . albuterol (PROVENTIL HFA;VENTOLIN HFA) 108 (90 BASE) MCG/ACT inhaler Inhale 2 puffs into the lungs every 4 (four) hours as needed for wheezing or shortness of breath. 1 Inhaler 0  .  atomoxetine (STRATTERA) 40 MG capsule Take 40 mg by mouth daily. Pt will be starting soon    . EPINEPHrine (EPIPEN 2-PAK) 0.3 mg/0.3 mL IJ SOAJ injection Inject 0.3 mLs (0.3 mg total) into the muscle once. 2 Device 0  . HYDROcodone-acetaminophen (NORCO/VICODIN) 5-325 MG per tablet Take 1-2 tablets by mouth every 4 (four) hours as needed for moderate pain. 12 tablet 0  . meloxicam (MOBIC) 15 MG tablet Take 15 mg by mouth daily.    . traMADol (ULTRAM) 50 MG tablet Take 50 mg by mouth 2 (two) times daily.     . [DISCONTINUED] ALBUTEROL IN Inhale into the lungs as needed.      . [DISCONTINUED] methylphenidate (CONCERTA) 36 MG CR tablet Take 36 mg by mouth every morning.      . [DISCONTINUED] sertraline (ZOLOFT) 50 MG tablet Take 50 mg by mouth daily.       No current facility-administered medications for this visit.    Allergies:   Iodine; Shellfish allergy; and Latex   Social History: Social History   Social History  . Marital Status: Single    Spouse Name: N/A  . Number of Children: N/A  . Years of Education: N/A   Occupational History  . Not on file.   Social History Main Topics  . Smoking status: Former Smoker -- 1.00 packs/day    Types: Cigarettes    Quit date: 10/01/2013  . Smokeless tobacco: Never Used  .  Alcohol Use: Yes     Comment: socially  . Drug Use: No  . Sexual Activity: Not Currently    Birth Control/ Protection: Abstinence   Other Topics Concern  . Not on file   Social History Narrative    Family History: Family History  Problem Relation Age of Onset  . Anesthesia problems Neg Hx   . Hypotension Neg Hx   . Malignant hyperthermia Neg Hx   . Pseudochol deficiency Neg Hx   . Hypertension Mother   . Diabetes Mother   . Bipolar disorder Mother   . Glaucoma Father   . Drug abuse Mother      Review of Systems: All other systems reviewed and are otherwise negative except as noted above.   Physical Exam: VS:  BP 93/61 mmHg  Pulse 109  Ht  (1.6  m)  Wt 117 lb (53.071 kg)  BMI 20.73 kg/m2  SpO2 97% , BMI Body mass index is 20.73 kg/(m^2).  GEN- The patient is well appearing, alert and oriented x 3 today.   HEENT: normocephalic, atraumatic; sclera clear, conjunctiva pink; hearing intact; oropharynx clear; neck supple  Lungs- Clear to ausculation bilaterally, normal work of breathing.  No wheezes, rales, rhonchi Heart- Regular rate and rhythm (paced) GI- soft, non-tender, non-distended, bowel sounds present Extremities- no clubbing, cyanosis, or edema; DP/PT/radial pulses 2+ bilaterally MS- no significant deformity or atrophy Skin- warm and dry, no rash or lesion; PPM pocket well healed Psych- euthymic mood, full affect Neuro- strength and sensation are intact  PPM Interrogation- reviewed in detail today,  See PACEART report  EKG:  EKG is not ordered today.  Recent Labs: 09/01/2014: BUN 14; Creatinine, Ser 1.04; Hemoglobin 15.1; Platelets 228.0; Potassium 3.5; Sodium 139   Wt Readings from Last 3 Encounters:  10/06/14 117 lb (53.071 kg)  09/20/14 118 lb 8 oz (53.751 kg)  09/13/14 131 lb (59.421 kg)     Other studies Reviewed: Additional studies/ records that were reviewed today include: Dr Odessa Fleming office notes, hospital records  Assessment and Plan:  1.  Complete heart block  Normal PPM function  See Pace Art report No changes today  2.  Atrial tachycardia Identified on device interrogation today Minimally symptomatic Histograms do not reflect high burden of atrial tachycardia Will check CBC, TSH and follow for now If increased AT burden, may need to consider AAD therapy  Advised ok to return to work without restrictions 10/13/14   Current medicines are reviewed at length with the patient today.   The patient does not have concerns regarding his medicines.  The following changes were made today:  none  Labs/ tests ordered today include: CBC, TSH   Disposition:   Follow up with Dr Graciela Husbands as  scheduled   Signed, Gypsy Balsam, NP 10/06/2014 8:16 PM  [facility] [address] [place] [address] [phone] (office) [phone] (fax)

## 2014-10-08 ENCOUNTER — Telehealth: Payer: Self-pay | Admitting: *Deleted

## 2014-10-08 NOTE — Telephone Encounter (Signed)
-----   Message from Marily Lente, NP sent at 10/07/2014  6:16 AM EDT ----- Please notify patient of normal labs

## 2014-10-08 NOTE — Telephone Encounter (Signed)
-----   Message from Amber K Seiler, NP sent at 10/07/2014  6:16 AM EDT ----- Please notify patient of normal labs 

## 2014-10-11 LAB — CUP PACEART INCLINIC DEVICE CHECK
MDC IDC SESS DTM: 20161010074926
Pulse Gen Model: 2240
Pulse Gen Serial Number: 7787423

## 2014-11-05 ENCOUNTER — Encounter: Payer: Self-pay | Admitting: Internal Medicine

## 2014-11-16 ENCOUNTER — Encounter: Payer: Self-pay | Admitting: Internal Medicine

## 2015-01-20 ENCOUNTER — Ambulatory Visit (INDEPENDENT_AMBULATORY_CARE_PROVIDER_SITE_OTHER): Payer: Medicaid Other | Admitting: Internal Medicine

## 2015-01-20 ENCOUNTER — Encounter: Payer: Self-pay | Admitting: Internal Medicine

## 2015-01-20 VITALS — BP 100/78 | HR 75 | Ht 64.0 in | Wt 125.0 lb

## 2015-01-20 DIAGNOSIS — I442 Atrioventricular block, complete: Secondary | ICD-10-CM

## 2015-01-20 DIAGNOSIS — Z950 Presence of cardiac pacemaker: Secondary | ICD-10-CM | POA: Diagnosis not present

## 2015-01-20 NOTE — Patient Instructions (Signed)
Medication Instructions: - no changes  Labwork: - none  Procedures/Testing: - none  Follow-Up: - Your physician wants you to follow-up in: October 2017 with Dr. Graciela Husbands. You will receive a reminder letter in the mail two months in advance. If you don't receive a letter, please call our office to schedule the follow-up appointment.   Any Additional Special Instructions Will Be Listed Below (If Applicable).     If you need a refill on your cardiac medications before your next appointment, please call your pharmacy.

## 2015-01-20 NOTE — Progress Notes (Signed)
Patient Care Team: Tammy Eartha Inch, MD as PCP - General (Family Medicine)   HPI  Mario Wyatt is a 25 y.o. male Is seen to establish care for pacemaker implanted for high-grade heart block at the age of 25 years old. 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 He underwent generator replacement in 2008 receiving a St. Jude device. . He has had no syncope. He has no limitations exercise tolerance.  His initial implant was subpectoral.   His change out procedure done with WC was complicated by fracture of the atrial lead during revision and separation of the RV pin./Lead;  he required 2 new leads.  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  When he was seen post procedurally, he adhesive capsulitis. This has resolved.    Past Medical History  Diagnosis Date  . Asthma   . Attention deficit hyperactivity disorder (ADHD)   . Anxiety   . Pacemaker     Placed when pt was 25 years old.  . Congenital complete AV block   . S/P cardiac pacemaker procedure, 09/08/14, St Jude medical 09/09/2014    Past Surgical History  Procedure Laterality Date  . Cardiac pacemaker placement  2008    second degree heart block  . Pacemaker insertion  1999  . Hernia repair    . Ep implantable device N/A 09/08/2014    Procedure: Pacemaker Implant;  Surgeon: Duke Salvia, MD;  Location: [facility] INVASIVE CV LAB;  Service: Cardiovascular;  Laterality: N/A;    Current Outpatient Prescriptions  Medication Sig Dispense Refill  . acetaminophen (TYLENOL) 500 MG tablet Take 1,000 mg by mouth every 6 (six) hours as needed for moderate pain or headache.     . albuterol (PROVENTIL HFA;VENTOLIN HFA) 108 (90 BASE) MCG/ACT inhaler Inhale 2 puffs into the lungs every 4 (four) hours as needed for wheezing or shortness of breath. 1 Inhaler 0  . atomoxetine (STRATTERA) 40 MG capsule Take 40 mg by mouth daily. Pt will be starting soon    . EPINEPHrine (EPIPEN  2-PAK) 0.3 mg/0.3 mL IJ SOAJ injection Inject 0.3 mLs (0.3 mg total) into the muscle once. 2 Device 0  . meloxicam (MOBIC) 15 MG tablet Take 15 mg by mouth daily.    . traMADol (ULTRAM) 50 MG tablet Take 50 mg by mouth 2 (two) times daily.     . [DISCONTINUED] ALBUTEROL IN Inhale into the lungs as needed.      . [DISCONTINUED] methylphenidate (CONCERTA) 36 MG CR tablet Take 36 mg by mouth every morning.      . [DISCONTINUED] sertraline (ZOLOFT) 50 MG tablet Take 50 mg by mouth daily.       No current facility-administered medications for this visit.   Soc hx  He is working somoking some and drinking some   He has an identical twin brother  On October  Review of Systems negative except from HPI and PMH  Physical Exam BP 100/78 mmHg  Pulse 75  Ht  (1.626 m)  Wt 125 lb (56.7 kg)  BMI 21.45 kg/m2 Well developed and well nourished in no acute distress HENT normal E scleral and icterus clear Neck Supple JVP flat; carotids brisk and full Device pocket well healed; without hematoma or erythema.  There is no tethering Clear to ausculation Pocket with resolving hematoma,green and purple with swelling or tenderness  Regular rate and rhythm, no murmurs gallops or rub Soft with active bowel sounds No clubbing cyanosis  Edema Alert and oriented, grossly normal motor and sensory function Skin Warm and Dry  ECG demonstrates P-synchronous/ AV  pacing  Assessment and  Plan  High Grade Heart Block  Pacemaker  St Jude  Adhesive capsulitis  Atrial tachycardia  Adhesive capsulitis has resolved.  We will plan to see him again in 9 months.  He has short-lived episodes of atrial tachycardia heart rate 190-220

## 2015-02-01 LAB — CUP PACEART INCLINIC DEVICE CHECK
Date Time Interrogation Session: 20170131133558
Implantable Lead Implant Date: 20160907
Implantable Lead Location: 753860
Implantable Lead Model: 1948
Lead Channel Pacing Threshold Amplitude: 0.75 V
Lead Channel Pacing Threshold Amplitude: 1.25 V
Lead Channel Sensing Intrinsic Amplitude: 1.6 mV
Lead Channel Sensing Intrinsic Amplitude: 11.4 mV
MDC IDC LEAD IMPLANT DT: 20160907
MDC IDC LEAD LOCATION: 753859
MDC IDC MSMT LEADCHNL RA PACING THRESHOLD PULSEWIDTH: 0.4 ms
MDC IDC MSMT LEADCHNL RV PACING THRESHOLD PULSEWIDTH: 0.4 ms
MDC IDC PG SERIAL: 7787423

## 2015-08-12 ENCOUNTER — Encounter (HOSPITAL_COMMUNITY): Payer: Self-pay | Admitting: Emergency Medicine

## 2015-08-12 ENCOUNTER — Emergency Department (HOSPITAL_COMMUNITY)
Admission: EM | Admit: 2015-08-12 | Discharge: 2015-08-12 | Disposition: A | Discharge status: Home / Self Care | Payer: Medicaid Other | Attending: Emergency Medicine | Admitting: Emergency Medicine

## 2015-08-12 DIAGNOSIS — Z87891 Personal history of nicotine dependence: Secondary | ICD-10-CM | POA: Insufficient documentation

## 2015-08-12 DIAGNOSIS — Z91040 Latex allergy status: Secondary | ICD-10-CM | POA: Insufficient documentation

## 2015-08-12 DIAGNOSIS — Z950 Presence of cardiac pacemaker: Secondary | ICD-10-CM | POA: Insufficient documentation

## 2015-08-12 DIAGNOSIS — R1084 Generalized abdominal pain: Secondary | ICD-10-CM | POA: Diagnosis present

## 2015-08-12 DIAGNOSIS — F909 Attention-deficit hyperactivity disorder, unspecified type: Secondary | ICD-10-CM | POA: Insufficient documentation

## 2015-08-12 DIAGNOSIS — R197 Diarrhea, unspecified: Secondary | ICD-10-CM | POA: Diagnosis not present

## 2015-08-12 DIAGNOSIS — J45909 Unspecified asthma, uncomplicated: Secondary | ICD-10-CM | POA: Diagnosis not present

## 2015-08-12 DIAGNOSIS — R112 Nausea with vomiting, unspecified: Secondary | ICD-10-CM | POA: Insufficient documentation

## 2015-08-12 LAB — COMPREHENSIVE METABOLIC PANEL
ALT: 22 U/L (ref 17–63)
AST: 30 U/L (ref 15–41)
Albumin: 4.3 g/dL (ref 3.5–5.0)
Alkaline Phosphatase: 67 U/L (ref 38–126)
Anion gap: 10 (ref 5–15)
BUN: 16 mg/dL (ref 6–20)
CHLORIDE: 104 mmol/L (ref 101–111)
CO2: 24 mmol/L (ref 22–32)
CREATININE: 1 mg/dL (ref 0.61–1.24)
Calcium: 9.4 mg/dL (ref 8.9–10.3)
GFR calc Af Amer: >60 mL/min (ref >60)
GFR calc non Af Amer: >60 mL/min (ref >60)
Glucose, Bld: 141 mg/dL — ABNORMAL HIGH (ref 65–99)
Potassium: 4 mmol/L (ref 3.5–5.1)
SODIUM: 138 mmol/L (ref 135–145)
Total Bilirubin: 1 mg/dL (ref 0.3–1.2)
Total Protein: 7.3 g/dL (ref 6.5–8.1)

## 2015-08-12 LAB — URINALYSIS, ROUTINE W REFLEX MICROSCOPIC
Bilirubin Urine: NEGATIVE
GLUCOSE, UA: NEGATIVE mg/dL
HGB URINE DIPSTICK: NEGATIVE
KETONES UR: 15 mg/dL — AB
Leukocytes, UA: NEGATIVE
Nitrite: NEGATIVE
PROTEIN: NEGATIVE mg/dL
Specific Gravity, Urine: 1.03 (ref 1.005–1.030)
pH: 5.5 (ref 5.0–8.0)

## 2015-08-12 LAB — CBC
HCT: 46.7 % (ref 39.0–52.0)
Hemoglobin: 16.1 g/dL (ref 13.0–17.0)
MCH: 30.4 pg (ref 26.0–34.0)
MCHC: 34.5 g/dL (ref 30.0–36.0)
MCV: 88.1 fL (ref 78.0–100.0)
PLATELETS: 248 10*3/uL (ref 150–400)
RBC: 5.3 MIL/uL (ref 4.22–5.81)
RDW: 13.1 % (ref 11.5–15.5)
WBC: 6.2 10*3/uL (ref 4.0–10.5)

## 2015-08-12 LAB — LIPASE, BLOOD: LIPASE: 27 U/L (ref 11–51)

## 2015-08-12 MED ORDER — ONDANSETRON HCL 4 MG PO TABS: 4.0000 mg | ORAL_TABLET | Freq: Two times a day (BID) | ORAL | 0 refills | Status: DC | PRN

## 2015-08-12 MED ORDER — ONDANSETRON 4 MG PO TBDP
4.0000 mg | ORAL_TABLET | Freq: Once | ORAL | Status: AC
  Administered 2015-08-12: 4 mg via ORAL
  Filled 2015-08-12: qty 1

## 2015-08-12 MED ORDER — ONDANSETRON 4 MG PO TBDP: 4.0000 mg | ORAL_TABLET | Freq: Once | ORAL | Status: DC

## 2015-08-12 NOTE — ED Provider Notes (Signed)
[facility]-EMERGENCY DEPT Provider Note   CSN: [id number] Arrival date & time: 08/12/15  1429  First Provider Contact:  None       History   Chief Complaint Chief Complaint  Patient presents with  . Abdominal Pain    HPI Mario Wyatt is a 25 y.o. male.  HPI  This patient with medical history as below presents for vomiting and diarrhea. Began yesterday, associated with some 10 of 10 diffuse abdominal pain. Worse with eating. Denies blood in stool or emesis. Denies fevers. Has decreased oral intake due to nausea.   Past Medical History:  Diagnosis Date  . Anxiety   . Asthma   . Attention deficit hyperactivity disorder (ADHD)   . Congenital complete AV block   . Pacemaker    Placed when pt was 25 years old.  . S/P cardiac pacemaker procedure, 09/08/14, St Jude medical 09/09/2014    Patient Active Problem List   Diagnosis Date Noted  . Pacemaker at end of battery life 09/09/2014  . S/P cardiac pacemaker procedure, 09/08/14, St Jude medical, gen change 09/09/2014  . Complete heart block (HCC) 09/08/2014  . Adult attention deficit disorder 05/26/2014  . Cervical pain 05/26/2014  . Back pain, chronic 05/26/2014  . AV block, High Grade, congenital 06/11/2013  . Pacemaker 05/27/2013  . Vomiting 04/05/2013  . Diarrhea 04/05/2013  . Nausea 04/05/2013  . Artificial cardiac pacemaker 03/21/2011  . 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 CARDIAC PACEMAKER PLACEMENT  2008   second degree heart block  . EP IMPLANTABLE DEVICE N/A 09/08/2014   Procedure: Pacemaker Implant;  Surgeon: Duke Salvia, MD;  Location: [facility] INVASIVE CV LAB;  Service: Cardiovascular;  Laterality: N/A;  . HERNIA REPAIR    . PACEMAKER INSERTION  1999       Home Medications    Prior to Admission medications   Medication Sig Start Date End Date Taking? Authorizing Provider    acetaminophen (TYLENOL) 500 MG tablet Take 1,000 mg by mouth every 6 (six) hours as needed for moderate pain or headache.     Historical Provider, MD  albuterol (PROVENTIL HFA;VENTOLIN HFA) 108 (90 BASE) MCG/ACT inhaler Inhale 2 puffs into the lungs every 4 (four) hours as needed for wheezing or shortness of breath. 04/11/14   Devoria Albe, MD  atomoxetine (STRATTERA) 40 MG capsule Take 40 mg by mouth daily. Pt will be starting soon 09/29/14 09/29/15  Historical Provider, MD  EPINEPHrine (EPIPEN 2-PAK) 0.3 mg/0.3 mL IJ SOAJ injection Inject 0.3 mLs (0.3 mg total) into the muscle once. 04/11/14   Devoria Albe, MD  traMADol (ULTRAM) 50 MG tablet Take 50 mg by mouth 2 (two) times daily.  09/29/14   Historical Provider, MD    Family History Family History  Problem Relation Age of Onset  . Hypertension Mother   . Diabetes Mother   . Bipolar disorder Mother   . Drug abuse Mother   . Glaucoma Father   . Anesthesia problems Neg Hx   . Hypotension Neg Hx   . Malignant hyperthermia Neg Hx   . Pseudochol deficiency Neg Hx     Social History Social History  Substance Use Topics  . Smoking status: Former Smoker    Packs/day: 1.00    Types: Cigarettes    Quit date: 10/01/2013  . Smokeless tobacco: Never Used  . Alcohol use Yes  Comment: socially     Allergies   Iodine; Shellfish allergy; and Latex   Review of Systems Review of Systems  Constitutional: Negative for chills and fever.  HENT: Negative for ear pain and sore throat.   Eyes: Negative for pain and visual disturbance.  Respiratory: Negative for cough and shortness of breath.   Cardiovascular: Negative for chest pain and palpitations.  Gastrointestinal: Positive for diarrhea, nausea and vomiting. Negative for abdominal pain.  Genitourinary: Negative for dysuria and hematuria.  Musculoskeletal: Negative for arthralgias and back pain.  Skin: Negative for color change and rash.  Neurological: Negative for seizures and syncope.  All  other systems reviewed and are negative.    Physical Exam Updated Vital Signs BP 104/73   Pulse 87   Temp 98.6 F (37 C) (Oral)   Resp 16   Ht  (1.6 m)   Wt 54.2 kg   SpO2 99%   BMI 21.16 kg/m   Physical Exam  Constitutional: He appears well-developed and well-nourished.  HENT:  Head: Normocephalic and atraumatic.  Eyes: Conjunctivae are normal.  Neck: Neck supple.  Cardiovascular: Normal rate and regular rhythm.   No murmur heard. Pulmonary/Chest: Effort normal and breath sounds normal. No respiratory distress.  Abdominal: Soft. There is no tenderness.  Musculoskeletal: He exhibits no edema.  Neurological: He is alert.  Skin: Skin is warm and dry.  Psychiatric: He has a normal mood and affect.  Nursing note and vitals reviewed.    ED Treatments / Results  Labs (all labs ordered are listed, but only abnormal results are displayed) Labs Reviewed  COMPREHENSIVE METABOLIC PANEL - Abnormal; Notable for the following:       Result Value   Glucose, Bld 141 (*)    All other components within normal limits  URINALYSIS, ROUTINE W REFLEX MICROSCOPIC (NOT AT [facility]) - Abnormal; Notable for the following:    Ketones, ur 15 (*)    All other components within normal limits  LIPASE, BLOOD  CBC    EKG  EKG Interpretation None       Radiology No results found.  Procedures Procedures (including critical care time)  Medications Ordered in ED Medications - No data to display   Initial Impression / Assessment and Plan / ED Course  I have reviewed the triage vital signs and the nursing notes.  Pertinent labs & imaging results that were available during my care of the patient were reviewed by me and considered in my medical decision making (see chart for details).  Clinical Course    Patient's benign abdominal exam. No evidence of vomiting here. Given Zofran with improvement in nausea, tolerating by mouth. Labs unremarkable. Doubt pancreatitis setting of normal  abdominal exam, normal lipase. Doubt appendicitis in the setting of no leukocytosis, no fever. Doubt serious abdominal disease in setting of benign abdominal exam.  We have discussed the discharge plan, including the plan for outpatient followup, and strict return precautions, including those that would require calling 911.     Final Clinical Impressions(s) / ED Diagnoses   Final diagnoses:  None    New Prescriptions New Prescriptions   No medications on file     Marcelina Morel, MD 08/12/15 2125    Eber Hong, MD 08/13/15 1005

## 2015-08-12 NOTE — ED Triage Notes (Signed)
Pt. Stated, I started have stomach pain yesterday with some diarrhea. Today I have a headache.  My work says I have to have a note to come back even if it has passed.

## 2015-08-12 NOTE — ED Provider Notes (Signed)
I saw and evaluated the patient, reviewed the resident's note and I agree with the findings and plan.  Pertinent History: The patient is a 25 year old male status post pacemaker placement for arrhythmia, high degree heart block, has had 24 hours of nausea vomiting and diarrhea, watery stools, multiple episodes of vomiting, minimal abdominal discomfort when he vomits, no sick contacts, no travel, no food borne illness, no fever chills swelling chest pain coughing or shortness of breath.  Pertinent Exam findings: On exam the patient is not tachycardic, he has normal pulses, no edema, soft abdomen with normal bowel sounds, clear heart and lung sounds, labs are totally normal, no other symptoms, Zofran, oral hydration, home, recommended that he stay at work for 24 hours while his symptoms resolve'   Final diagnoses:  Nausea vomiting and diarrhea      Eber Hong, MD 08/13/15 1005

## 2015-10-17 ENCOUNTER — Encounter: Payer: Self-pay | Admitting: Physician Assistant

## 2015-10-17 NOTE — Progress Notes (Deleted)
Cardiology Office Note Date:  10/17/2015  Patient ID:  Mario Wyatt, DOB 1990/09/27, MRN [id number] PCP:  Verlon AuBoyd, Tammy Lamonica, MD  Cardiologist:  Dr. Graciela HusbandsKlein  ***refresh   Chief Complaint: planned routine visit  History of Present Illness: Mario PickettChristopher Quizhpi is a 25 y.o. male with history of high degree heart block, PPM since age 89, Dr. 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 He underwent generator replacement in 2008 and then in 09/08/14  [facility] by fracture of the atrial lead during revision and separation of the RV pin./Lead;  he required 2 new leads.  postprocedure was further complicated by adhesive capsulitis and hematoma, that resolved.  He comes in today to be seen for Dr. Graciela HusbandsKlein, feeling ***  Device History: SJM dual chamber PPM, gen change/new leads as noted above, 09/08/14.  Original implant at age 89, gen change 2008, 2016.  Dr. Graciela HusbandsKlein, high degree AVBlock.   Past Medical History:  Diagnosis Date  . Anxiety   . Asthma   . Attention deficit hyperactivity disorder (ADHD)   . Congenital complete AV block   . Pacemaker    Placed when pt was 25 years old.  . S/P cardiac pacemaker procedure, 09/08/14, St Jude medical 09/09/2014    Past Surgical History:  Procedure Laterality Date  . CARDIAC PACEMAKER PLACEMENT  2008   second degree heart block  . EP IMPLANTABLE DEVICE N/A 09/08/2014   Procedure: Pacemaker Implant;  Surgeon: Duke SalviaSteven C Klein, MD;  [facility];  Service: Cardiovascular;  Laterality: N/A;  . HERNIA REPAIR    . PACEMAKER INSERTION  1999    Current Outpatient Prescriptions  Medication Sig Dispense Refill  . acetaminophen (TYLENOL) 500 MG tablet Take 1,000 mg by mouth every 6 (six) hours as needed for moderate pain or headache.     . albuterol (PROVENTIL HFA;VENTOLIN HFA) 108 (90 BASE) MCG/ACT inhaler Inhale 2 puffs into the lungs every 4 (four) hours as  needed for wheezing or shortness of breath. 1 Inhaler 0  . atomoxetine (STRATTERA) 40 MG capsule Take 40 mg by mouth daily. Pt will be starting soon    . EPINEPHrine (EPIPEN 2-PAK) 0.3 mg/0.3 mL IJ SOAJ injection Inject 0.3 mLs (0.3 mg total) into the muscle once. 2 Device 0  . ondansetron (ZOFRAN) 4 MG tablet Take 1 tablet (4 mg total) by mouth every 12 (twelve) hours as needed for vomiting. 6 tablet 0  . traMADol (ULTRAM) 50 MG tablet Take 50 mg by mouth 2 (two) times daily.      No current facility-administered medications for this visit.     Allergies:   Iodine; Shellfish allergy; and Latex   Social History:  The patient  reports that he quit smoking about 2 years ago. His smoking use included Cigarettes. He smoked 1.00 pack per day. He has never used smokeless tobacco. He reports that he drinks alcohol. He reports that he does not use drugs.   Family History:  The patient's family history includes Bipolar disorder in his mother; Diabetes in his mother; Drug abuse in his mother; Glaucoma in his father; Hypertension in his mother.  ROS:  Please see the history of present illness.  All other systems are reviewed and otherwise negative.   PHYSICAL EXAM: *** VS:  There were no vitals taken for this visit. BMI: There is no height or weight on file to calculate BMI. Well nourished, well developed, in no acute  distress  HEENT: normocephalic, atraumatic  Neck: no JVD, carotid bruits or masses Cardiac: ***; no significant murmurs, no rubs, or gallops Lungs:  clear to auscultation bilaterally, no wheezing, rhonchi or rales  Abd: soft, nontender MS: no deformity or atrophy Ext: no edema  Skin: warm and dry, no rash Neuro:  No gross deficits appreciated Psych: euthymic mood, full affect  *** PPM site is stable, no tethering or discomfort   EKG:  Done today shows *** PPM interrogation today: ***  Recent Labs: 08/12/2015: ALT 22; BUN 16; Creatinine, Ser 1.00; Hemoglobin 16.1; Platelets  248; Potassium 4.0; Sodium 138  No results found for requested labs within last 8760 hours.   CrCl cannot be calculated (Unknown ideal weight.).   Wt Readings from Last 3 Encounters:  08/12/15 119 lb 7 oz (54.2 kg)  01/20/15 125 lb (56.7 kg)  10/06/14 117 lb (53.1 kg)     Other studies reviewed: Additional studies/records reviewed today include: summarized above  ASSESSMENT AND PLAN:  1. High degree AVblock, PPM     *** normal function  Disposition: F/u with ***  Current medicines are reviewed at length with the patient today.  The patient did not have any concerns regarding medicines.***  Signed, Sherrilee Gilles, PA-C 10/17/2015 12:57 PM     [facility] [address] [place] [address] [phone] (office)  [phone] (fax)

## 2015-10-18 ENCOUNTER — Encounter: Payer: Self-pay | Admitting: Physician Assistant

## 2015-10-18 NOTE — Progress Notes (Signed)
Cardiology Office Note Date:  10/17/2015  Patient ID:  Mario Wyatt, DOB [birth year], MRN [id number] PCP:  Mario Wyatt, Mario Lamonica, MD   Cardiologist:  Mario Wyatt    Chief Complaint: planned routine visit  History of Present Illness: Mario Wyatt is a 25 y.o. male with history of high degree heart block, PPM since age 89, Dr. 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 He underwent generator replacement in 2008 and then in 09/08/14  [facility] by fracture of the atrial lead during revision and separation of the RV pin./Lead; he required 2 new leads. postprocedure was further complicated by adhesive capsulitis and hematoma, that resolved.  Note that he has had A tach on device checks.  He comes in today to be seen for Mario Wyatt, feeling Ok though mentions he feels unusually tired, regardless of how much sleep he has gotten. He wakes feeling tired, feels like he could use a midday nap often.  No palpitations, no dizziness, near syncope or syncope.  He reports at times feels unusually SOB with activity but is not waking SOB.  In the last 6 months he also mentions a stinging/fleeting type feeling in the center of his chest that is random, but sometimes has noted it occurs with deep inspiration, but not always.  He thinks his fatigue onset was after his last visit and device check, recalls programming changes being made, he thinks that was to slow his HR, especially at night.   Device History: SJM dual chamber PPM, gen change/new leads as noted above, 09/08/14.  Original implant at age 89, gen change 2008, 2016.  Mario Wyatt, high degree AVBlock.  PACING at 40bpm       Past Medical History:  Diagnosis Date  . Anxiety   . Asthma   . Attention deficit hyperactivity disorder (ADHD)   . Congenital complete AV block   . Pacemaker    Placed when pt was 25 years old.  . S/P cardiac pacemaker  procedure, 09/08/14, [employer] medical 09/09/2014         Past Surgical History:  Procedure Laterality Date  . CARDIAC PACEMAKER PLACEMENT  2008   second degree heart block  . EP IMPLANTABLE DEVICE N/A 09/08/2014   Procedure: Pacemaker Implant;  Surgeon: Mario SalviaSteven C Klein, MD;  [facility];  Service: Cardiovascular;  Laterality: N/A;  . HERNIA REPAIR    . PACEMAKER INSERTION  1999          Current Outpatient Prescriptions  Medication Sig Dispense Refill  . acetaminophen (TYLENOL) 500 MG tablet Take 1,000 mg by mouth every 6 (six) hours as needed for moderate pain or headache.     . albuterol (PROVENTIL HFA;VENTOLIN HFA) 108 (90 BASE) MCG/ACT inhaler Inhale 2 puffs into the lungs every 4 (four) hours as needed for wheezing or shortness of breath. 1 Inhaler 0  . atomoxetine (STRATTERA) 40 MG capsule Take 40 mg by mouth daily. Pt will be starting soon    . EPINEPHrine (EPIPEN 2-PAK) 0.3 mg/0.3 mL IJ SOAJ injection Inject 0.3 mLs (0.3 mg total) into the muscle once. 2 Device 0  . ondansetron (ZOFRAN) 4 MG tablet Take 1 tablet (4 mg total) by mouth every 12 (twelve) hours as needed for vomiting. 6 tablet 0  . traMADol (ULTRAM) 50 MG tablet Take 50 mg by mouth 2 (two) times daily.      No current facility-administered medications for this visit.  Allergies:   Iodine; Shellfish allergy; and Latex   Social History:  The patient  reports that he quit smoking about 2 years ago. His smoking use included Cigarettes. He smoked 1.00 pack per day. He has never used smokeless tobacco. He reports that he drinks alcohol. He reports that he does not use drugs.   Family History:  The patient's family history includes Bipolar disorder in his mother; Diabetes in his mother; Drug abuse in his mother; Glaucoma in his father; Hypertension in his mother.  ROS:  Please see the history of present illness.  All other systems are reviewed and otherwise negative.   PHYSICAL EXAM:   VS: 120/78,  61bpm,  125lbs   Well nourished, well developed, in no acute distress  HEENT: normocephalic, atraumatic  Neck: no JVD, carotid bruits or masses Cardiac:RRR; no significant murmurs, no rubs, or gallops Lungs:  clear to auscultation bilaterally, no wheezing, rhonchi or rales  Abd: soft, nontender MS: no deformity or atrophy Ext: no edema  Skin: warm and dry, no rash Neuro:  No gross deficits appreciated Psych: euthymic mood, full affect  PPM site is stable, no tethering or discomfort   EKG:  Done 01/10/15 is SR, V paced, today he is AV paced reviewed by myself. PPM interrogation today: normal device function. Stable battery/lead status stable, AMS episodes noted, longedst 12 seconds, ST vs Atach, he had one magnet response noted.  Reversion mode was changed from VOO to DOO  Recent Labs: 08/12/2015: ALT 22; BUN 16; Creatinine, Ser 1.00; Hemoglobin 16.1; Platelets 248; Potassium 4.0; Sodium 138  No results found for requested labs within last 8760 hours.   CrCl cannot be calculated (Unknown ideal weight.).      Wt Readings from Last 3 Encounters:  08/12/15 119 lb 7 oz (54.2 kg)  01/20/15 125 lb (56.7 kg)  10/06/14 117 lb (53.1 kg)     Other studies reviewed: Additional studies/records reviewed today include: summarized above  ASSESSMENT AND PLAN:  1. High degree AVblock, PPM     normal device function     Home monitoring is not working, device representative will f/u, until this is resolved, 6 month in-clinic device check will be scheduled.  2. Fatigue     no sleep rates/programming is on.  He has good HR variability, only programming changes noted were to decrease outputs to chronic settings, no other programming adjustments appear to have been made. He reports snoring and historically has been recommended to get sleep study done though unable to with changing of his work shifts.  Currently he works 1 third shift once a week, days the other 4,   this swing shift type work may contribute as well.  3. CP     Atypical  Will send him for a sleep study, get an echo to evaluate his complaints, recent labs look OK.  Disposition: F/u with Dr. Graciela Husbands in 6 months, sooner if needed.  Home device is not working, device   Current medicines are reviewed at length with the patient today.  The patient did not have any concerns regarding medicines  Signed, Sherrilee Gilles, PA-C 10/17/2015 12:57 PM     [facility] [address] [place] [address] [phone] (office)  [phone] (fax)

## 2015-10-19 ENCOUNTER — Encounter: Payer: Self-pay | Admitting: Physician Assistant

## 2015-10-19 ENCOUNTER — Encounter (INDEPENDENT_AMBULATORY_CARE_PROVIDER_SITE_OTHER): Payer: Self-pay

## 2015-10-19 ENCOUNTER — Ambulatory Visit (INDEPENDENT_AMBULATORY_CARE_PROVIDER_SITE_OTHER): Payer: Medicaid Other | Admitting: Physician Assistant

## 2015-10-19 VITALS — BP 120/78 | HR 61 | Ht 63.0 in | Wt 125.0 lb

## 2015-10-19 DIAGNOSIS — R079 Chest pain, unspecified: Secondary | ICD-10-CM | POA: Diagnosis not present

## 2015-10-19 DIAGNOSIS — I441 Atrioventricular block, second degree: Secondary | ICD-10-CM | POA: Diagnosis not present

## 2015-10-19 DIAGNOSIS — R5383 Other fatigue: Secondary | ICD-10-CM

## 2015-10-19 DIAGNOSIS — G479 Sleep disorder, unspecified: Secondary | ICD-10-CM

## 2015-10-19 NOTE — Patient Instructions (Addendum)
Medication Instructions:   Your physician recommends that you continue on your current medications as directed. Please refer to the Current Medication list given to you today.   If you need a refill on your cardiac medications before your next appointment, please call your pharmacy.  Labwork: NONE ORDER TODAY    Testing/Procedures:  Your physician has requested that you have an echocardiogram. Echocardiography is a painless test that uses sound waves to create images of your heart. It provides your doctor with information about the size and shape of your heart and how well your heart's chambers and valves are working. This procedure takes approximately one hour. There are no restrictions for this procedure.   Your physician has recommended that you have a sleep study. This test records several body functions during sleep, including: brain activity, eye movement, oxygen and carbon dioxide blood levels, heart rate and rhythm, breathing rate and rhythm, the flow of air through your mouth and nose, snoring, body muscle movements, and chest and belly movement. YOU WILL BE CONTACTED BY SOMEONE  WITH THE SLEEP CLINIC     Follow-Up: Your physician wants you to follow-up in: ONE YEAR WITH Graciela Husbands  You will receive a reminder letter in the mail two months in advance. If you don't receive a letter, please call our office to schedule the follow-up appointment.   Your physician wants you to follow-up in:  IN  6  MONTHS WITH DEVICE CLINIC  You will receive a reminder letter in the mail two months in advance. If you don't receive a letter, please call our office to schedule the follow-up appointment.      Any Other Special Instructions Will Be Listed Below (If Applicable).

## 2015-11-04 ENCOUNTER — Other Ambulatory Visit (HOSPITAL_COMMUNITY): Payer: Self-pay

## 2015-12-05 ENCOUNTER — Other Ambulatory Visit (HOSPITAL_COMMUNITY): Payer: Self-pay

## 2015-12-07 ENCOUNTER — Ambulatory Visit (HOSPITAL_BASED_OUTPATIENT_CLINIC_OR_DEPARTMENT_OTHER): Payer: Self-pay

## 2016-01-05 ENCOUNTER — Encounter (HOSPITAL_BASED_OUTPATIENT_CLINIC_OR_DEPARTMENT_OTHER): Payer: Self-pay

## 2016-01-30 ENCOUNTER — Emergency Department (HOSPITAL_COMMUNITY): Payer: Medicaid Other

## 2016-01-30 ENCOUNTER — Emergency Department (HOSPITAL_COMMUNITY)
Admission: EM | Admit: 2016-01-30 | Discharge: 2016-01-30 | Disposition: A | Discharge status: Home / Self Care | Payer: Medicaid Other | Attending: Emergency Medicine | Admitting: Emergency Medicine

## 2016-01-30 ENCOUNTER — Encounter (HOSPITAL_COMMUNITY): Payer: Self-pay | Admitting: Emergency Medicine

## 2016-01-30 DIAGNOSIS — Z950 Presence of cardiac pacemaker: Secondary | ICD-10-CM | POA: Diagnosis not present

## 2016-01-30 DIAGNOSIS — Y9389 Activity, other specified: Secondary | ICD-10-CM | POA: Diagnosis not present

## 2016-01-30 DIAGNOSIS — R0789 Other chest pain: Secondary | ICD-10-CM | POA: Diagnosis not present

## 2016-01-30 DIAGNOSIS — R079 Chest pain, unspecified: Secondary | ICD-10-CM | POA: Diagnosis present

## 2016-01-30 DIAGNOSIS — Z79899 Other long term (current) drug therapy: Secondary | ICD-10-CM | POA: Insufficient documentation

## 2016-01-30 DIAGNOSIS — Z87891 Personal history of nicotine dependence: Secondary | ICD-10-CM | POA: Insufficient documentation

## 2016-01-30 DIAGNOSIS — Z91040 Latex allergy status: Secondary | ICD-10-CM | POA: Diagnosis not present

## 2016-01-30 DIAGNOSIS — F909 Attention-deficit hyperactivity disorder, unspecified type: Secondary | ICD-10-CM | POA: Insufficient documentation

## 2016-01-30 DIAGNOSIS — Y92410 Unspecified street and highway as the place of occurrence of the external cause: Secondary | ICD-10-CM | POA: Diagnosis not present

## 2016-01-30 DIAGNOSIS — Y999 Unspecified external cause status: Secondary | ICD-10-CM | POA: Insufficient documentation

## 2016-01-30 DIAGNOSIS — J45909 Unspecified asthma, uncomplicated: Secondary | ICD-10-CM | POA: Diagnosis not present

## 2016-01-30 LAB — BASIC METABOLIC PANEL
Anion gap: 7 (ref 5–15)
BUN: 16 mg/dL (ref 6–20)
CHLORIDE: 106 mmol/L (ref 101–111)
CO2: 27 mmol/L (ref 22–32)
Calcium: 9.4 mg/dL (ref 8.9–10.3)
Creatinine, Ser: 1.08 mg/dL (ref 0.61–1.24)
GFR calc non Af Amer: >60 mL/min (ref >60)
Glucose, Bld: 117 mg/dL — ABNORMAL HIGH (ref 65–99)
POTASSIUM: 3.8 mmol/L (ref 3.5–5.1)
SODIUM: 140 mmol/L (ref 135–145)

## 2016-01-30 LAB — CBC
HEMATOCRIT: 44.4 % (ref 39.0–52.0)
HEMOGLOBIN: 15.8 g/dL (ref 13.0–17.0)
MCH: 30.3 pg (ref 26.0–34.0)
MCHC: 35.6 g/dL (ref 30.0–36.0)
MCV: 85.1 fL (ref 78.0–100.0)
Platelets: 236 10*3/uL (ref 150–400)
RBC: 5.22 MIL/uL (ref 4.22–5.81)
RDW: 12.3 % (ref 11.5–15.5)
WBC: 4.2 10*3/uL (ref 4.0–10.5)

## 2016-01-30 LAB — I-STAT TROPONIN, ED: Troponin i, poc: 0.01 ng/mL (ref 0.00–0.08)

## 2016-01-30 MED ORDER — METHOCARBAMOL 500 MG PO TABS: ORAL_TABLET | ORAL | 0 refills | Status: DC

## 2016-01-30 MED ORDER — KETOROLAC TROMETHAMINE 60 MG/2ML IM SOLN
30.0000 mg | Freq: Once | INTRAMUSCULAR | Status: AC
  Administered 2016-01-30: 30 mg via INTRAMUSCULAR
  Filled 2016-01-30: qty 2
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
## 2016-01-30 NOTE — Discharge Instructions (Signed)
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
## 2016-02-09 ENCOUNTER — Encounter (HOSPITAL_COMMUNITY): Payer: Self-pay | Admitting: Radiology

## 2016-02-16 ENCOUNTER — Ambulatory Visit (HOSPITAL_BASED_OUTPATIENT_CLINIC_OR_DEPARTMENT_OTHER): Payer: No Typology Code available for payment source

## 2016-02-21 ENCOUNTER — Encounter (HOSPITAL_COMMUNITY): Payer: Self-pay | Admitting: *Deleted

## 2016-02-21 NOTE — Progress Notes (Unsigned)
Patient has No-Showed for his echo two times, A letter has been sent with instructions.  This echo order has been removed from work queue.

## 2016-03-27 ENCOUNTER — Encounter (HOSPITAL_BASED_OUTPATIENT_CLINIC_OR_DEPARTMENT_OTHER): Payer: Medicaid Other

## 2016-04-17 ENCOUNTER — Telehealth: Payer: Self-pay | Admitting: Internal Medicine

## 2016-04-17 NOTE — Telephone Encounter (Signed)
LMOM to call back to device clinic to f/u with monitor/ PPM appts.

## 2016-04-17 NOTE — Telephone Encounter (Signed)
New message    Called pt to confirm appt and he said he is having trouble hooking up his box to check his pace maker.

## 2016-04-30 ENCOUNTER — Encounter (HOSPITAL_BASED_OUTPATIENT_CLINIC_OR_DEPARTMENT_OTHER): Payer: Medicaid Other

## 2016-06-08 ENCOUNTER — Encounter: Payer: Self-pay | Admitting: Physician Assistant

## 2016-07-03 DIAGNOSIS — J45909 Unspecified asthma, uncomplicated: Secondary | ICD-10-CM | POA: Insufficient documentation

## 2016-07-05 ENCOUNTER — Ambulatory Visit (INDEPENDENT_AMBULATORY_CARE_PROVIDER_SITE_OTHER): Payer: Medicaid Other | Admitting: *Deleted

## 2016-07-05 ENCOUNTER — Encounter: Payer: Self-pay | Admitting: Physician Assistant

## 2016-07-05 ENCOUNTER — Encounter: Payer: Self-pay | Admitting: Internal Medicine

## 2016-07-05 ENCOUNTER — Telehealth: Payer: Self-pay | Admitting: Physician Assistant

## 2016-07-05 ENCOUNTER — Encounter: Payer: Self-pay | Admitting: *Deleted

## 2016-07-05 ENCOUNTER — Ambulatory Visit (INDEPENDENT_AMBULATORY_CARE_PROVIDER_SITE_OTHER): Payer: Medicaid Other | Admitting: Physician Assistant

## 2016-07-05 VITALS — BP 102/68 | HR 60 | Ht 65.0 in | Wt 117.6 lb

## 2016-07-05 DIAGNOSIS — Z950 Presence of cardiac pacemaker: Secondary | ICD-10-CM

## 2016-07-05 DIAGNOSIS — R0600 Dyspnea, unspecified: Secondary | ICD-10-CM | POA: Diagnosis not present

## 2016-07-05 DIAGNOSIS — R0789 Other chest pain: Secondary | ICD-10-CM | POA: Diagnosis not present

## 2016-07-05 DIAGNOSIS — R0683 Snoring: Secondary | ICD-10-CM

## 2016-07-05 DIAGNOSIS — R400 Somnolence: Secondary | ICD-10-CM

## 2016-07-05 DIAGNOSIS — R5383 Other fatigue: Secondary | ICD-10-CM

## 2016-07-05 DIAGNOSIS — I442 Atrioventricular block, complete: Secondary | ICD-10-CM

## 2016-07-05 NOTE — Patient Instructions (Signed)
LABS TODAY CBC CMP TSH    SCHEDULE AT [address] Your physician has requested that you have an echocardiogram. Echocardiography is a painless test that uses sound waves to create images of your heart. It provides your doctor with information about the size and shape of your heart and how well your heart's chambers and valves are working. This procedure takes approximately one hour. There are no restrictions for this procedure.    WILL BE SCHEDULE AT [facility] - ON [address]. Your physician has recommended that you have a sleep study. This test records several body functions during sleep, including: brain activity, eye movement, oxygen and carbon dioxide blood levels, heart rate and rhythm, breathing rate and rhythm, the flow of air through your mouth and nose, snoring, body muscle movements, and chest and belly movement.     Your physician recommends that you schedule a follow-up appointment in 5 MONTHS(DEC 2018)  WITH DR Graciela Husbands

## 2016-07-05 NOTE — Telephone Encounter (Signed)
Patient calling, states that he called out of work because he doesn't feel well. Patient states that he feels "sluggish, out of it and even if I get 6-7 hours of sleep and still feel like a zombie." Patient states his PCP ran tests and everything was fine but PCP suggested he come to our office.

## 2016-07-05 NOTE — Progress Notes (Signed)
Cardiology Office Note    Date:  07/05/2016   ID:  Mario Wyatt, DOB 1990-11-23, MRN [id number]  PCP:  Verlon Au, MD  Cardiologist:  Dr. Graciela Husbands  Chief Complaint  Patient presents with  . Follow-up    seen for Dr. Graciela Husbands. Add-on for evaluation of fatigue    History of Present Illness:  Mario Wyatt is a 26 y.o. 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 He underwent generator replacement in 2008 and then again in September 2016. Change out procedure was complicated by fracture of the atrial lead revision and the separation of the RV lead. He required 2 new leads. Post procedure, was further complicated by adhesion capsulitis and hematoma which eventually resolved.   He was last seen by Mario Wyatt on 10/19/2015, he was having tingling in the feeding type of sensation in the center of his chest that was random. His symptom was felt to be atypical, therefore no ischemic workup was recommended. Due to persistent snoring, sleep study was recommended. Echocardiogram was recommended as well. Neither sleep study or echocardiogram has been done. He presents today for evaluation of severe fatigue for the past week, increasing dyspnea, continued chest pain. He described the chest pain as a dull pressure-like sensation that last a few seconds at a time and largely unchanged compared to the symptom in 2017. His chest pain does not seems to have strong correlation with exertion and can occur both at rest and with exertion. He also has occasional dizziness as well. Interrogation of his pacemaker showed normal device function, greater than 99% ventricular pacing, 38% atrial pacing. EKG showed ventricularly paced rhythm, heart rate 60. Widened QRS. Given his persistent symptoms, I recommended echocardiogram that was recommended last year to make sure he is not having any  pacemaker-induced cardiomyopathy. He says he does not get a good rest and night, he will need a sleep study as well. Otherwise, given the fact that there was no significant arrhythmia on the pacemaker interrogation, I recommended rule out other secondary causes. I will obtain CBC, CMP, and TSH.    Past Medical History:  Diagnosis Date  . Anxiety   . Asthma   . Attention deficit hyperactivity disorder (ADHD)   . Congenital complete AV block   . Pacemaker    Placed when pt was 26 years old.  . S/P cardiac pacemaker procedure, 09/08/14, St Jude medical 09/09/2014    Past Surgical History:  Procedure Laterality Date  . CARDIAC PACEMAKER PLACEMENT  2008   second degree heart block  . EP IMPLANTABLE DEVICE N/A 09/08/2014   Procedure: Pacemaker Implant;  Surgeon: Duke Salvia, MD;  Location: [facility] INVASIVE CV LAB;  Service: Cardiovascular;  Laterality: N/A;  . HERNIA REPAIR    . PACEMAKER INSERTION  1999    Current Medications: Outpatient Medications Prior to Visit  Medication Sig Dispense Refill  . albuterol (PROVENTIL HFA;VENTOLIN HFA) 108 (90 BASE) MCG/ACT inhaler Inhale 2 puffs into the lungs every 4 (four) hours as needed for wheezing or shortness of breath. 1 Inhaler 0  . atomoxetine (STRATTERA) 40 MG capsule Take 40 mg by mouth daily.     Marland Kitchen EPINEPHrine (EPIPEN 2-PAK) 0.3 mg/0.3 mL IJ SOAJ injection Inject 0.3 mLs (0.3 mg total) into the muscle once. (Patient not taking: Reported on 01/30/2016) 2 Device 0  . meloxicam (MOBIC) 15 MG tablet  Take 15 mg by mouth daily.    . methocarbamol (ROBAXIN) 500 MG tablet Can take up to 1-2 tabs every 6 hours PRN PAIN 20 tablet 0  . Multiple Vitamin (MULTIVITAMIN WITH MINERALS) TABS tablet Take 1 tablet by mouth daily.    . traMADol (ULTRAM) 50 MG tablet Take 50 mg by mouth every 6 (six) hours as needed for moderate pain.      No facility-administered medications prior to visit.      Allergies:   Iodine; Shellfish allergy; and Latex   Social History     Social History  . Marital status: Single    Spouse name: N/A  . Number of children: N/A  . Years of education: N/A   Social History Main Topics  . Smoking status: Former Smoker    Packs/day: 1.00    Types: Cigarettes    Quit date: 10/01/2013  . Smokeless tobacco: Never Used  . Alcohol use Yes     Comment: socially  . Drug use: No  . Sexual activity: Not Currently    Birth control/ protection: Abstinence   Other Topics Concern  . None   Social History Narrative  . None     Family History:  The patient's family history includes Bipolar disorder in his mother; Diabetes in his mother; Drug abuse in his mother; Glaucoma in his father; Hypertension in his mother.   ROS:   Please see the history of present illness.    ROS All other systems reviewed and are negative.   PHYSICAL EXAM:   VS:  BP 102/68   Pulse 60   Ht 5\' 5"  (1.651 m)   Wt 117 lb 9.6 oz (53.3 kg)   BMI 19.57 kg/m    GEN: Well nourished, well developed, in no acute distress  HEENT: normal  Neck: no JVD, carotid bruits, or masses Cardiac: RRR; no murmurs, rubs, or gallops,no edema  Respiratory:  clear to auscultation bilaterally, normal work of breathing GI: soft, nontender, nondistended, + BS MS: no deformity or atrophy  Skin: warm and dry, no rash Neuro:  Alert and Oriented x 3, Strength and sensation are intact Psych: euthymic mood, full affect  Wt Readings from Last 3 Encounters:  07/05/16 117 lb 9.6 oz (53.3 kg)  01/30/16 135 lb (61.2 kg)  10/19/15 125 lb (56.7 kg)      Studies/Labs Reviewed:   EKG:  EKG is ordered today.  The ekg ordered today demonstrates ventricularly paced rhythm, heart rate 60, widened QRS   Recent Labs: 08/12/2015: ALT 22 01/30/2016: BUN 16; Creatinine, Ser 1.08; Hemoglobin 15.8; Platelets 236; Potassium 3.8; Sodium 140   Lipid Panel No results found for: CHOL, TRIG, HDL, CHOLHDL, VLDL, LDLCALC, LDLDIRECT  Additional studies/ records that were reviewed today include:    PPM device change out 09/08/2014 RA/RV Lead Placement: The left axillary vein was therefore cannulated.  Through the left axillary vein, a [facility] Jude Medical model 1688TC-52(serial number  [id number]) right atrial lead and a [facility] model [phone] (serial number [id number]) right ventricular lead were advanced with fluoroscopic visualization into the right atrial appendage and right ventricular apex positions respectively.  Initial atrial lead P- waves measured 1.6 mV with impedance of 342 ohms and a threshold of 0.7 V at 0.5 msec.  Right ventricular lead R-waves measured 10.6 mV with an impedance of 678 ohms and a threshold of 0.4 V at 0.5 msec.  Both leads were secured to the pectoralis fascia using #2-0 silk over the suture sleeves.  Lead caps were placed on the pre-existing leads and placed into the pocket.  Device Placement:  The leads were then connected to a [facility] Assurity DR  model [id number] (serial number  [id number] ) pacemaker.  The pocket was irrigated with copious gentamicin solution.  The pacemaker was then placed into the pocket.  The pocket was then closed in 1 layer with 2.0 Vicryl suture for the subpectoral pocket, and 1 layer with 2.0 Vicryl suture for the subcutaneous and 2 layers with 2.0 Vicryl suture subcuticular layers.  Steri-  Strips and a sterile dressing were then applied. EBL<66ml.  There were no early apparent complications.     CONCLUSIONS:   1. Successful implantation of a St Jude [employer] Assurity DR  dual-chamber pacemaker for symptomatic bradycardia  2. No early apparent complications   ASSESSMENT:    1. Fatigue, unspecified type   2. Daytime sleepiness   3. Snoring   4. Atypical chest pain   5. Dyspnea, unspecified type   6. Pacemaker   7. Complete heart block (HCC)      PLAN:  In order of problems listed above:  1. Fatigue: Increasing for the past week, also accompanied by shortness of breath as well, I recommended echocardiogram. He also has  daytime sleepiness and at nighttime snoring, he will need a sleep study. I also ordered a CBC, CMP, and a TSH to rule out secondary causes.  2. Atypical chest pain: This is the same chest pain he had in 2017, only last a few seconds at a time. No strong correlation with exertion, will hold off on ischemic workup as this time.  3. H/o CHB s/p St Jude PPM: Device interrogated today, normal device function significant arrhythmia. 99% ventricularly paced, 38% atrial paced.    Medication Adjustments/Labs and Tests Ordered: Current medicines are reviewed at length with the patient today.  Concerns regarding medicines are outlined above.  Medication changes, Labs and Tests ordered today are listed in the Patient Instructions below. Patient Instructions  LABS TODAY CBC CMP TSH    SCHEDULE AT [address] Your physician has requested that you have an echocardiogram. Echocardiography is a painless test that uses sound waves to create images of your heart. It provides your doctor with information about the size and shape of your heart and how well your heart's chambers and valves are working. This procedure takes approximately one hour. There are no restrictions for this procedure.    WILL BE SCHEDULE AT [facility] ON [address]. Your physician has recommended that you have a sleep study. This test records several body functions during sleep, including: brain activity, eye movement, oxygen and carbon dioxide blood levels, heart rate and rhythm, breathing rate and rhythm, the flow of air through your mouth and nose, snoring, body muscle movements, and chest and belly movement.     Your physician recommends that you schedule a follow-up appointment in 5 MONTHS(DEC 2018)  WITH DR Landry Mellow, [place]  07/05/2016 1:33 PM    [facility] [address] [place], [place], [place]  [address] Phone: [phone]; Fax: [phone]

## 2016-07-05 NOTE — Telephone Encounter (Signed)
S/w pt he states that he had to stay out of work because of his fatigue and unable to go to work and states that he will need a note for work.. Pt states that he went to his PCP and was told to call his cardiologist and discuss. Appt made today. Appt made for today at [facility] office, Pt notified to arrive 10 min early to register and review medication list. Pt notified to keep appt at [facility] on 7-26

## 2016-07-06 LAB — COMPREHENSIVE METABOLIC PANEL
A/G RATIO: 1.8 (ref 1.2–2.2)
ALBUMIN: 4.7 g/dL (ref 3.5–5.5)
ALK PHOS: 54 IU/L (ref 39–117)
ALT: 19 IU/L (ref 0–44)
AST: 28 IU/L (ref 0–40)
BUN / CREAT RATIO: 12 (ref 9–20)
BUN: 13 mg/dL (ref 6–20)
Bilirubin Total: 1.2 mg/dL (ref 0.0–1.2)
CO2: 24 mmol/L (ref 20–29)
CREATININE: 1.08 mg/dL (ref 0.76–1.27)
Calcium: 9.7 mg/dL (ref 8.7–10.2)
Chloride: 103 mmol/L (ref 96–106)
GFR calc Af Amer: 109 mL/min/{1.73_m2} (ref >59)
GFR, EST NON AFRICAN AMERICAN: 94 mL/min/{1.73_m2} (ref >59)
GLOBULIN, TOTAL: 2.6 g/dL (ref 1.5–4.5)
Glucose: 86 mg/dL (ref 65–99)
POTASSIUM: 4.7 mmol/L (ref 3.5–5.2)
SODIUM: 143 mmol/L (ref 134–144)
Total Protein: 7.3 g/dL (ref 6.0–8.5)

## 2016-07-06 LAB — CBC
Hematocrit: 43.5 % (ref 37.5–51.0)
Hemoglobin: 15.3 g/dL (ref 13.0–17.7)
MCH: 30.6 pg (ref 26.6–33.0)
MCHC: 35.2 g/dL (ref 31.5–35.7)
MCV: 87 fL (ref 79–97)
PLATELETS: 209 10*3/uL (ref 150–379)
RBC: 5 x10E6/uL (ref 4.14–5.80)
RDW: 12.8 % (ref 12.3–15.4)
WBC: 4.3 10*3/uL (ref 3.4–10.8)

## 2016-07-06 LAB — TSH: TSH: 0.99 u[IU]/mL (ref 0.450–4.500)

## 2016-07-06 NOTE — Progress Notes (Signed)
Thyroid, red blood cell count and electrolyte normal. No secondary causes for fatigue was found. Proceed with echo as previous recommended

## 2016-07-06 NOTE — Progress Notes (Signed)
Remote pacemaker transmission.   

## 2016-07-13 ENCOUNTER — Encounter: Payer: Self-pay | Admitting: Cardiology

## 2016-07-26 ENCOUNTER — Ambulatory Visit: Payer: Self-pay | Admitting: Physician Assistant

## 2016-07-27 ENCOUNTER — Other Ambulatory Visit: Payer: Self-pay

## 2016-07-27 ENCOUNTER — Ambulatory Visit (HOSPITAL_COMMUNITY): Payer: Medicaid Other | Attending: Cardiology

## 2016-07-27 DIAGNOSIS — I081 Rheumatic disorders of both mitral and tricuspid valves: Secondary | ICD-10-CM | POA: Diagnosis not present

## 2016-07-27 DIAGNOSIS — R0600 Dyspnea, unspecified: Secondary | ICD-10-CM | POA: Diagnosis not present

## 2016-07-27 DIAGNOSIS — R0789 Other chest pain: Secondary | ICD-10-CM | POA: Diagnosis not present

## 2016-07-27 DIAGNOSIS — R5383 Other fatigue: Secondary | ICD-10-CM | POA: Diagnosis not present

## 2016-07-27 MED ORDER — PERFLUTREN LIPID MICROSPHERE
1.0000 mL | INTRAVENOUS | Status: AC | PRN
  Administered 2016-07-27: 2 mL via INTRAVENOUS

## 2016-07-30 ENCOUNTER — Other Ambulatory Visit: Payer: Self-pay

## 2016-07-30 ENCOUNTER — Emergency Department (HOSPITAL_COMMUNITY): Payer: Medicaid Other

## 2016-07-30 ENCOUNTER — Encounter (HOSPITAL_COMMUNITY): Payer: Self-pay

## 2016-07-30 ENCOUNTER — Emergency Department (HOSPITAL_COMMUNITY)
Admission: EM | Admit: 2016-07-30 | Discharge: 2016-07-30 | Disposition: A | Discharge status: Home / Self Care | Payer: Medicaid Other | Attending: Emergency Medicine | Admitting: Emergency Medicine

## 2016-07-30 DIAGNOSIS — Y92410 Unspecified street and highway as the place of occurrence of the external cause: Secondary | ICD-10-CM | POA: Insufficient documentation

## 2016-07-30 DIAGNOSIS — Z950 Presence of cardiac pacemaker: Secondary | ICD-10-CM | POA: Diagnosis not present

## 2016-07-30 DIAGNOSIS — R0789 Other chest pain: Secondary | ICD-10-CM | POA: Diagnosis not present

## 2016-07-30 DIAGNOSIS — F17210 Nicotine dependence, cigarettes, uncomplicated: Secondary | ICD-10-CM | POA: Diagnosis not present

## 2016-07-30 DIAGNOSIS — Y999 Unspecified external cause status: Secondary | ICD-10-CM | POA: Insufficient documentation

## 2016-07-30 DIAGNOSIS — S161XXA Strain of muscle, fascia and tendon at neck level, initial encounter: Secondary | ICD-10-CM | POA: Diagnosis not present

## 2016-07-30 DIAGNOSIS — Z79899 Other long term (current) drug therapy: Secondary | ICD-10-CM | POA: Insufficient documentation

## 2016-07-30 DIAGNOSIS — S1980XA Other specified injuries of unspecified part of neck, initial encounter: Secondary | ICD-10-CM | POA: Diagnosis present

## 2016-07-30 DIAGNOSIS — J45909 Unspecified asthma, uncomplicated: Secondary | ICD-10-CM | POA: Insufficient documentation

## 2016-07-30 DIAGNOSIS — Y939 Activity, unspecified: Secondary | ICD-10-CM | POA: Diagnosis not present

## 2016-07-30 DIAGNOSIS — Z91040 Latex allergy status: Secondary | ICD-10-CM | POA: Insufficient documentation

## 2016-07-30 MED ORDER — LORAZEPAM 1 MG PO TABS
0.5000 mg | ORAL_TABLET | Freq: Once | ORAL | Status: AC
Start: 2016-07-30 — End: 2016-07-30
  Administered 2016-07-30: 0.5 mg via ORAL
  Filled 2016-07-30: qty 1

## 2016-07-30 MED ORDER — ACETAMINOPHEN 325 MG PO TABS
650.0000 mg | ORAL_TABLET | Freq: Once | ORAL | Status: AC
  Administered 2016-07-30: 650 mg via ORAL

## 2016-07-30 MED ORDER — IBUPROFEN 400 MG PO TABS
600.0000 mg | ORAL_TABLET | Freq: Once | ORAL | Status: AC
  Administered 2016-07-30: 600 mg via ORAL
  Filled 2016-07-30: qty 1

## 2016-07-30 MED ORDER — MELOXICAM 15 MG PO TABS: 15.0000 mg | ORAL_TABLET | Freq: Every day | ORAL | 0 refills | Status: DC | PRN

## 2016-07-30 MED ORDER — OXYCODONE-ACETAMINOPHEN 5-325 MG PO TABS
1.0000 | ORAL_TABLET | Freq: Once | ORAL | Status: AC
  Administered 2016-07-30: 1 via ORAL
  Filled 2016-07-30: qty 1

## 2016-07-30 MED ORDER — ACETAMINOPHEN 325 MG PO TABS
ORAL_TABLET | ORAL | Status: AC
  Filled 2016-07-30: qty 2
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
## 2016-07-30 NOTE — ED Notes (Signed)
Got patient undress on the monitor patient is waiting on provider
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
## 2016-07-31 LAB — CUP PACEART REMOTE DEVICE CHECK
Battery Remaining Longevity: 121 mo
Battery Remaining Percentage: 95.5 %
Battery Voltage: 2.99 V
Brady Statistic AP VP Percent: 39 %
Brady Statistic AP VS Percent: 1 %
Brady Statistic AS VP Percent: 61 %
Brady Statistic AS VS Percent: 1 %
Brady Statistic RA Percent Paced: 38 %
Brady Statistic RV Percent Paced: 99 %
Date Time Interrogation Session: 20180705232115
Implantable Lead Implant Date: 20160907
Implantable Lead Implant Date: 20160907
Implantable Lead Location: 753859
Implantable Lead Location: 753860
Implantable Lead Model: 1948
Implantable Pulse Generator Implant Date: 20160907
Lead Channel Impedance Value: 480 Ohm
Lead Channel Impedance Value: 750 Ohm
Lead Channel Pacing Threshold Amplitude: 1 V
Lead Channel Pacing Threshold Amplitude: 1.125 V
Lead Channel Pacing Threshold Pulse Width: 0.4 ms
Lead Channel Pacing Threshold Pulse Width: 0.4 ms
Lead Channel Sensing Intrinsic Amplitude: 12 mV
Lead Channel Sensing Intrinsic Amplitude: 2.8 mV
Lead Channel Setting Pacing Amplitude: 1.25 V
Lead Channel Setting Pacing Amplitude: 2.125
Lead Channel Setting Pacing Pulse Width: 0.4 ms
Lead Channel Setting Sensing Sensitivity: 4 mV
Pulse Gen Model: 2240
Pulse Gen Serial Number: 7787423

## 2016-08-01 NOTE — Progress Notes (Signed)
As discussed with Mario Wyatt, I will see this patient within a week and delay followup with Mario Wyatt to 3-4 weeks

## 2016-08-02 ENCOUNTER — Encounter (HOSPITAL_COMMUNITY): Payer: Self-pay | Admitting: Emergency Medicine

## 2016-08-02 ENCOUNTER — Emergency Department (HOSPITAL_COMMUNITY)
Admission: EM | Admit: 2016-08-02 | Discharge: 2016-08-02 | Disposition: A | Discharge status: Home / Self Care | Payer: Medicaid Other | Attending: Emergency Medicine | Admitting: Emergency Medicine

## 2016-08-02 ENCOUNTER — Other Ambulatory Visit: Payer: Self-pay

## 2016-08-02 ENCOUNTER — Emergency Department (HOSPITAL_COMMUNITY): Payer: Medicaid Other

## 2016-08-02 DIAGNOSIS — R51 Headache: Secondary | ICD-10-CM | POA: Diagnosis not present

## 2016-08-02 DIAGNOSIS — F909 Attention-deficit hyperactivity disorder, unspecified type: Secondary | ICD-10-CM | POA: Insufficient documentation

## 2016-08-02 DIAGNOSIS — S199XXD Unspecified injury of neck, subsequent encounter: Secondary | ICD-10-CM | POA: Diagnosis present

## 2016-08-02 DIAGNOSIS — Z91040 Latex allergy status: Secondary | ICD-10-CM | POA: Insufficient documentation

## 2016-08-02 DIAGNOSIS — Z950 Presence of cardiac pacemaker: Secondary | ICD-10-CM | POA: Diagnosis not present

## 2016-08-02 DIAGNOSIS — F419 Anxiety disorder, unspecified: Secondary | ICD-10-CM | POA: Insufficient documentation

## 2016-08-02 DIAGNOSIS — M549 Dorsalgia, unspecified: Secondary | ICD-10-CM | POA: Diagnosis not present

## 2016-08-02 DIAGNOSIS — S134XXD Sprain of ligaments of cervical spine, subsequent encounter: Secondary | ICD-10-CM

## 2016-08-02 DIAGNOSIS — Z87891 Personal history of nicotine dependence: Secondary | ICD-10-CM | POA: Diagnosis not present

## 2016-08-02 DIAGNOSIS — J45909 Unspecified asthma, uncomplicated: Secondary | ICD-10-CM | POA: Insufficient documentation

## 2016-08-02 DIAGNOSIS — R519 Headache, unspecified: Secondary | ICD-10-CM

## 2016-08-02 DIAGNOSIS — Z79899 Other long term (current) drug therapy: Secondary | ICD-10-CM | POA: Insufficient documentation

## 2016-08-02 LAB — CBC
HCT: 42.9 % (ref 39.0–52.0)
HEMOGLOBIN: 15 g/dL (ref 13.0–17.0)
MCH: 30 pg (ref 26.0–34.0)
MCHC: 35 g/dL (ref 30.0–36.0)
MCV: 85.8 fL (ref 78.0–100.0)
PLATELETS: 205 10*3/uL (ref 150–400)
RBC: 5 MIL/uL (ref 4.22–5.81)
RDW: 12.6 % (ref 11.5–15.5)
WBC: 5.5 10*3/uL (ref 4.0–10.5)

## 2016-08-02 LAB — BASIC METABOLIC PANEL
ANION GAP: 8 (ref 5–15)
BUN: 15 mg/dL (ref 6–20)
CALCIUM: 9.8 mg/dL (ref 8.9–10.3)
CO2: 25 mmol/L (ref 22–32)
CREATININE: 0.99 mg/dL (ref 0.61–1.24)
Chloride: 107 mmol/L (ref 101–111)
GFR calc Af Amer: >60 mL/min (ref >60)
GFR calc non Af Amer: >60 mL/min (ref >60)
GLUCOSE: 99 mg/dL (ref 65–99)
Potassium: 4 mmol/L (ref 3.5–5.1)
Sodium: 140 mmol/L (ref 135–145)

## 2016-08-02 LAB — I-STAT TROPONIN, ED: TROPONIN I, POC: 0.06 ng/mL (ref 0.00–0.08)

## 2016-08-02 MED ORDER — CYCLOBENZAPRINE HCL 10 MG PO TABS: 10.0000 mg | ORAL_TABLET | Freq: Two times a day (BID) | ORAL | 0 refills | Status: DC | PRN

## 2016-08-02 MED ORDER — KETOROLAC TROMETHAMINE 60 MG/2ML IM SOLN
60.0000 mg | Freq: Once | INTRAMUSCULAR | Status: AC
  Administered 2016-08-02: 60 mg via INTRAMUSCULAR
  Filled 2016-08-02: qty 2

## 2016-08-02 MED ORDER — IBUPROFEN 600 MG PO TABS: 600.0000 mg | ORAL_TABLET | Freq: Three times a day (TID) | ORAL | 0 refills | Status: DC | PRN
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
## 2016-08-02 NOTE — Discharge Instructions (Signed)
Your work up is reassuring today. Please return to ED with any new or worsening symptoms.

## 2016-08-05 NOTE — Progress Notes (Deleted)
Cardiology Office Note Date:  10/17/2015  Patient ID:  Mario Wyatt, DOB [birth year], MRN [id number] PCP:  Verlon Au, MD   Cardiologist:  Dr. Graciela Husbands    Chief Complaint: planned routine visit  History of Present Illness: Mario Wyatt is a 26 y.o. male with history of high degree heart block, PPM since age 89, Dr. 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 He underwent generator replacement in 2008 and then in 09/08/14  change out procedure done with [name] was complicated by fracture of the atrial lead during revision and separation of the RV pin./Lead; he required 2 new leads. postprocedure was further complicated by adhesive capsulitis and hematoma, that resolved.  Note that he has had A tach on device checks.  He was seen by myself in Oct 2017  mentions he feels unusually tired, regardless of how much sleep he has gotten. He wakes feeling tired, feels like he could use a midday nap often.  No palpitations, no dizziness, near syncope or syncope.  He reports at times feels unusually SOB with activity but is not waking SOB.  In the last 6 months he also mentions a stinging/fleeting type feeling in the center of his chest that is random, but sometimes has noted it occurs with deep inspiration, but not always.  He thinks his fatigue onset was after his last visit and device check, recalls programming changes being made, he thinks that was to slow his HR, especially at night.     At this visit he was recommended sleep study and echo. He no-showed to recurrently scheduled echos, did not get sleep study.  More recently he was seen by [name], PA last month with ongoing fatigue and CP felt to be atypical, he did get the echo done, thi noted a decrease in his EF and WMA.   *** symptoms *** angina?? *** ischemic eval/CTA? *** 26 y/o *** pacer mediated?   Device History: SJM dual chamber PPM, gen change/new leads as noted above,  09/08/14.  Original implant at age 64, gen change 2008, 2016.  Dr. Graciela Husbands, high degree AVBlock.  PACING at 40bpm       Past Medical History:  Diagnosis Date  . Anxiety   . Asthma   . Attention deficit hyperactivity disorder (ADHD)   . Congenital complete AV block   . Pacemaker    Placed when pt was 26 years old.  . S/P cardiac pacemaker procedure, 09/08/14, [employer] 09/09/2014         Past Surgical History:  Procedure Laterality Date  . CARDIAC PACEMAKER PLACEMENT  2008   second degree heart block  . EP IMPLANTABLE DEVICE N/A 09/08/2014   Procedure: Pacemaker Implant;  Surgeon: Duke Salvia, MD;  Location: [facility] INVASIVE CV LAB;  Service: Cardiovascular;  Laterality: N/A;  . HERNIA REPAIR    . PACEMAKER INSERTION  1999          Current Outpatient Prescriptions  Medication Sig Dispense Refill  . acetaminophen (TYLENOL) 500 MG tablet Take 1,000 mg by mouth every 6 (six) hours as needed for moderate pain or headache.     . albuterol (PROVENTIL HFA;VENTOLIN HFA) 108 (90 BASE) MCG/ACT inhaler Inhale 2 puffs into the lungs every 4 (four) hours as needed for wheezing or shortness of breath. 1 Inhaler 0  . atomoxetine (STRATTERA) 40 MG capsule Take 40 mg by mouth daily. Pt will be starting soon    . EPINEPHrine (EPIPEN 2-PAK) 0.3 mg/0.3  mL IJ SOAJ injection Inject 0.3 mLs (0.3 mg total) into the muscle once. 2 Device 0  . ondansetron (ZOFRAN) 4 MG tablet Take 1 tablet (4 mg total) by mouth every 12 (twelve) hours as needed for vomiting. 6 tablet 0  . traMADol (ULTRAM) 50 MG tablet Take 50 mg by mouth 2 (two) times daily.      No current facility-administered medications for this visit.     Allergies:   Iodine; Shellfish allergy; and Latex   Social History:  The patient  reports that he quit smoking about 2 years ago. His smoking use included Cigarettes. He smoked 1.00 pack per day. He has never used smokeless tobacco. He reports that he drinks alcohol. He  reports that he does not use drugs.   Family History:  The patient's family history includes Bipolar disorder in his mother; Diabetes in his mother; Drug abuse in his mother; Glaucoma in his father; Hypertension in his mother.  ROS:  Please see the history of present illness.  All other systems are reviewed and otherwise negative.   PHYSICAL EXAM:  VS: 120/78,  61bpm,  125lbs   Well nourished, well developed, in no acute distress  HEENT: normocephalic, atraumatic  Neck: no JVD, carotid bruits or masses Cardiac: *** RRR; no significant murmurs, no rubs, or gallops Lungs:  *** CTA b/l, no wheezing, rhonchi or rales  Abd: soft, nontender MS: no deformity or atrophy Ext: *** no edema  Skin: warm and dry, no rash Neuro:  No gross deficits appreciated Psych: euthymic mood, full affect  *** PPM site is stable, no tethering or discomfort   EKG:  Done*** PPM interrogation today with industry and reviewed by myself: ***  07/27/16: TTE Study Conclusions - Left ventricle: The cavity size was normal. Systolic function was   moderately reduced. The estimated ejection fraction was in the   range of 35% to 40%. There is akinesis of the apical myocardium.   There is severe hypokinesis of the mid-apicalinferior myocardium. - Ventricular septum: Septal motion showed paradox. These changes   are consistent with right ventricular pacing. - Aortic valve: Trileaflet; normal thickness, mildly calcified   leaflets. - Mitral valve: There was trivial regurgitation. - Tricuspid valve: There was trivial regurgitation.  Recent Labs: 08/12/2015: ALT 22; BUN 16; Creatinine, Ser 1.00; Hemoglobin 16.1; Platelets 248; Potassium 4.0; Sodium 138  No results found for requested labs within last 8760 hours.   CrCl cannot be calculated (Unknown ideal weight.).      Wt Readings from Last 3 Encounters:  08/12/15 119 lb 7 oz (54.2 kg)  01/20/15 125 lb (56.7 kg)  10/06/14 117 lb (53.1 kg)     Other  studies reviewed: Additional studies/records reviewed today include: summarized above  ASSESSMENT AND PLAN:  1. High degree AVblock, PPM     *** normal device function     *** Home monitoring is not working, device representative will f/u, until this is resolved, 6 month in-clinic device check will be scheduled.  2. Fatigue    ***no sleep rates/programming is on.  He has good HR variability, only programming changes noted were to decrease outputs to chronic settings, no other programming adjustments appear to have been made. He reports snoring and historically has been recommended to get sleep study done though unable to with changing of his work shifts.  Currently he works 1 third shift once a week, days the other 4,  this swing shift type work may contribute as well.  3. CP     ***  Atypical  4. New CM     ***    Disposition:   Current medicines are reviewed at length with the patient today.  The patient did not have any concerns regarding medicines  Signed, Sherrilee Gilles, PA-C 10/17/2015 12:57 PM     [facility] [address] [place] [address] [phone] (office)  [phone] (fax)

## 2016-08-06 ENCOUNTER — Encounter: Payer: Self-pay | Admitting: Physician Assistant

## 2016-08-06 ENCOUNTER — Ambulatory Visit (INDEPENDENT_AMBULATORY_CARE_PROVIDER_SITE_OTHER): Payer: Medicaid Other | Admitting: Physician Assistant

## 2016-08-06 ENCOUNTER — Telehealth: Payer: Self-pay | Admitting: *Deleted

## 2016-08-06 VITALS — BP 136/89 | Ht 64.0 in | Wt 123.0 lb

## 2016-08-06 DIAGNOSIS — R0789 Other chest pain: Secondary | ICD-10-CM | POA: Diagnosis not present

## 2016-08-06 DIAGNOSIS — Z950 Presence of cardiac pacemaker: Secondary | ICD-10-CM | POA: Diagnosis not present

## 2016-08-06 DIAGNOSIS — I428 Other cardiomyopathies: Secondary | ICD-10-CM

## 2016-08-06 DIAGNOSIS — R5383 Other fatigue: Secondary | ICD-10-CM

## 2016-08-06 MED ORDER — CARVEDILOL 3.125 MG PO TABS: 3.1250 mg | ORAL_TABLET | Freq: Two times a day (BID) | ORAL | 6 refills | Status: DC

## 2016-08-06 MED ORDER — LISINOPRIL 5 MG PO TABS: 5.0000 mg | ORAL_TABLET | Freq: Every day | ORAL | 6 refills | Status: DC

## 2016-08-06 NOTE — Telephone Encounter (Signed)
Was able to get in touch patient today. When I called him, he voiced he had planned to call us to cancel today's OV w [name] anyway, due to work. He is trying to get out of work at a time when he can come see Mario Wyatt. Wishes to discuss availability with Mario Wyatt and also had several questions regarding outcome of the echocardiogram. Please reach out to him when able, as discussed - thanks.

## 2016-08-06 NOTE — Telephone Encounter (Signed)
I called Mr. Mario Wyatt, it turned out he cancelled appt with [name] today at 3 pm, but now he is worried about the result want to be seen again. He continue to have weakness. I spoke to [facility] this morning as coronary CTA +/- FFR to assess the reason behind his LV dysfunction, cannot rule out pacing induced cardiomyopathy. I offered him 3 PM appt with me or Mario Wyatt, he wishes to see Mario Wyatt, I asked my nurse Stanton Kidney to schedule

## 2016-08-06 NOTE — Telephone Encounter (Signed)
Follow up scheduled at 3 pm today with renee

## 2016-08-06 NOTE — Patient Instructions (Addendum)
Medication Instructions:   START TAKING CARVEDILOL 3.125 MG TWICE A DAY  START TAKING  LISINOPRIL 5 MG ONCE A DAY    If you need a refill on your cardiac medications before your next appointment, please call your pharmacy.  Labwork: NONE ORDERED  TODAY    Testing/Procedures: NONE ORDERED  TODAY    Follow-Up:  ONE MONTH WITH URSUY   Any Other Special Instructions Will Be Listed Below (If Applicable).

## 2016-08-06 NOTE — Telephone Encounter (Signed)
-----   Message from [place], [place] sent at 07/28/2016  7:23 AM EDT ----- Patient require early return office visit within a week to discuss echo result and further evaluation
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
## 2016-08-15 ENCOUNTER — Encounter: Payer: Self-pay | Admitting: Physician Assistant

## 2016-08-17 ENCOUNTER — Encounter: Payer: Self-pay | Admitting: Physician Assistant

## 2016-08-29 ENCOUNTER — Encounter (HOSPITAL_BASED_OUTPATIENT_CLINIC_OR_DEPARTMENT_OTHER): Payer: Self-pay

## 2016-09-06 ENCOUNTER — Encounter: Payer: Self-pay | Admitting: Physician Assistant
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
## 2016-09-07 ENCOUNTER — Encounter: Payer: Self-pay | Admitting: Physician Assistant
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
## 2016-09-20 ENCOUNTER — Encounter: Payer: Self-pay | Admitting: Physician Assistant
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
## 2016-10-02 ENCOUNTER — Encounter: Payer: Self-pay | Admitting: Physician Assistant

## 2016-10-04 ENCOUNTER — Encounter: Payer: Self-pay | Admitting: Physician Assistant

## 2016-10-04 ENCOUNTER — Ambulatory Visit (INDEPENDENT_AMBULATORY_CARE_PROVIDER_SITE_OTHER): Payer: Medicaid Other | Admitting: Physician Assistant

## 2016-10-04 ENCOUNTER — Ambulatory Visit (INDEPENDENT_AMBULATORY_CARE_PROVIDER_SITE_OTHER): Payer: Medicaid Other | Admitting: *Deleted

## 2016-10-04 VITALS — BP 118/78 | HR 90 | Ht 64.0 in | Wt 123.0 lb

## 2016-10-04 DIAGNOSIS — I429 Cardiomyopathy, unspecified: Secondary | ICD-10-CM | POA: Diagnosis not present

## 2016-10-04 DIAGNOSIS — I442 Atrioventricular block, complete: Secondary | ICD-10-CM | POA: Diagnosis not present

## 2016-10-04 DIAGNOSIS — Q246 Congenital heart block: Secondary | ICD-10-CM

## 2016-10-04 DIAGNOSIS — Z950 Presence of cardiac pacemaker: Secondary | ICD-10-CM

## 2016-10-04 LAB — CUP PACEART INCLINIC DEVICE CHECK
Battery Voltage: 2.99 V
Brady Statistic RA Percent Paced: 27 %
Brady Statistic RV Percent Paced: 99.86 %
Date Time Interrogation Session: 20181004142832
Implantable Lead Implant Date: 20160907
Lead Channel Pacing Threshold Amplitude: 0.75 V
Lead Channel Pacing Threshold Amplitude: 1.25 V
Lead Channel Pacing Threshold Pulse Width: 0.4 ms
Lead Channel Pacing Threshold Pulse Width: 0.4 ms
Lead Channel Setting Sensing Sensitivity: 4 mV
MDC IDC LEAD IMPLANT DT: 20160907
MDC IDC LEAD LOCATION: 753859
MDC IDC LEAD LOCATION: 753860
MDC IDC MSMT BATTERY REMAINING LONGEVITY: 120 mo
MDC IDC MSMT LEADCHNL RA PACING THRESHOLD AMPLITUDE: 1.25 V
MDC IDC MSMT LEADCHNL RA SENSING INTR AMPL: 2.2 mV
MDC IDC MSMT LEADCHNL RV PACING THRESHOLD AMPLITUDE: 0.75 V
MDC IDC MSMT LEADCHNL RV PACING THRESHOLD PULSEWIDTH: 0.4 ms
MDC IDC MSMT LEADCHNL RV PACING THRESHOLD PULSEWIDTH: 0.4 ms
MDC IDC MSMT LEADCHNL RV SENSING INTR AMPL: 9.1 mV
MDC IDC PG IMPLANT DT: 20160907
MDC IDC SET LEADCHNL RA PACING AMPLITUDE: 2.125
MDC IDC SET LEADCHNL RV PACING AMPLITUDE: 1.125
MDC IDC SET LEADCHNL RV PACING PULSEWIDTH: 0.4 ms
Pulse Gen Model: 2240
Pulse Gen Serial Number: 7787423

## 2016-10-04 NOTE — Patient Instructions (Addendum)
Medication Instructions:   Your physician recommends that you continue on your current medications as directed. Please refer to the Current Medication list given to you today.   If you need a refill on your cardiac medications before your next appointment, please call your pharmacy.  Labwork: NONE ORDERED  TODAY    Testing/Procedures: IN 3 MONTHS Your physician has requested that you have an echocardiogram. Echocardiography is a painless test that uses sound waves to create images of your heart. It provides your doctor with information about the size and shape of your heart and how well your heart's chambers and valves are working. This procedure takes approximately one hour. There are no restrictions for this procedure.   Follow-Up: AFTER ECHO WITH KLEIN OR URSUY    Any Other Special Instructions Will Be Listed Below (If Applicable).

## 2016-10-04 NOTE — Progress Notes (Signed)
Remote pacemaker transmission.   
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
## 2016-10-10 LAB — CUP PACEART REMOTE DEVICE CHECK
Battery Remaining Longevity: 124 mo
Battery Remaining Percentage: 95.5 %
Battery Voltage: 2.99 V
Brady Statistic AS VS Percent: 1 %
Brady Statistic RA Percent Paced: 27 %
Date Time Interrogation Session: 20181004122649
Implantable Lead Implant Date: 20160907
Implantable Lead Implant Date: 20160907
Implantable Lead Location: 753860
Implantable Lead Model: 1948
Implantable Pulse Generator Implant Date: 20160907
Lead Channel Impedance Value: 750 Ohm
Lead Channel Pacing Threshold Amplitude: 0.875 V
Lead Channel Pacing Threshold Amplitude: 1.25 V
Lead Channel Pacing Threshold Pulse Width: 0.4 ms
Lead Channel Pacing Threshold Pulse Width: 0.4 ms
Lead Channel Sensing Intrinsic Amplitude: 2.5 mV
Lead Channel Setting Pacing Amplitude: 1.125
Lead Channel Setting Sensing Sensitivity: 4 mV
MDC IDC LEAD LOCATION: 753859
MDC IDC MSMT LEADCHNL RA IMPEDANCE VALUE: 480 Ohm
MDC IDC MSMT LEADCHNL RV SENSING INTR AMPL: 9.1 mV
MDC IDC PG SERIAL: 7787423
MDC IDC SET LEADCHNL RA PACING AMPLITUDE: 2.25 V
MDC IDC SET LEADCHNL RV PACING PULSEWIDTH: 0.4 ms
MDC IDC STAT BRADY AP VP PERCENT: 27 %
MDC IDC STAT BRADY AP VS PERCENT: 1 %
MDC IDC STAT BRADY AS VP PERCENT: 73 %
MDC IDC STAT BRADY RV PERCENT PACED: 99 %

## 2016-10-11 ENCOUNTER — Encounter: Payer: Self-pay | Admitting: Cardiology

## 2016-10-17 ENCOUNTER — Encounter (HOSPITAL_BASED_OUTPATIENT_CLINIC_OR_DEPARTMENT_OTHER): Payer: Self-pay

## 2016-11-20 DIAGNOSIS — J4520 Mild intermittent asthma, uncomplicated: Secondary | ICD-10-CM | POA: Insufficient documentation

## 2016-12-02 ENCOUNTER — Ambulatory Visit (HOSPITAL_BASED_OUTPATIENT_CLINIC_OR_DEPARTMENT_OTHER): Payer: No Typology Code available for payment source | Attending: Physician Assistant

## 2016-12-06 ENCOUNTER — Telehealth: Payer: Self-pay | Admitting: *Deleted

## 2016-12-06 NOTE — Telephone Encounter (Signed)
ERROR

## 2016-12-12 ENCOUNTER — Telehealth: Payer: Self-pay | Admitting: Internal Medicine

## 2016-12-12 NOTE — Telephone Encounter (Signed)
New Message   Patient stating that he needs a referral for a sleep study.

## 2016-12-12 NOTE — Telephone Encounter (Signed)
LMTCB

## 2016-12-13 ENCOUNTER — Other Ambulatory Visit: Payer: Self-pay | Admitting: *Deleted

## 2016-12-13 ENCOUNTER — Encounter: Payer: Self-pay | Admitting: Internal Medicine

## 2016-12-13 IMAGING — CR DG CHEST 2V
2 series · 2 of 2 positions shown · non-contrast
Comparison: May 28, 2013.

CLINICAL DATA: Status post pacemaker placement. Shortness of
breath.

EXAM:
CHEST  2 VIEW

[chest pa]
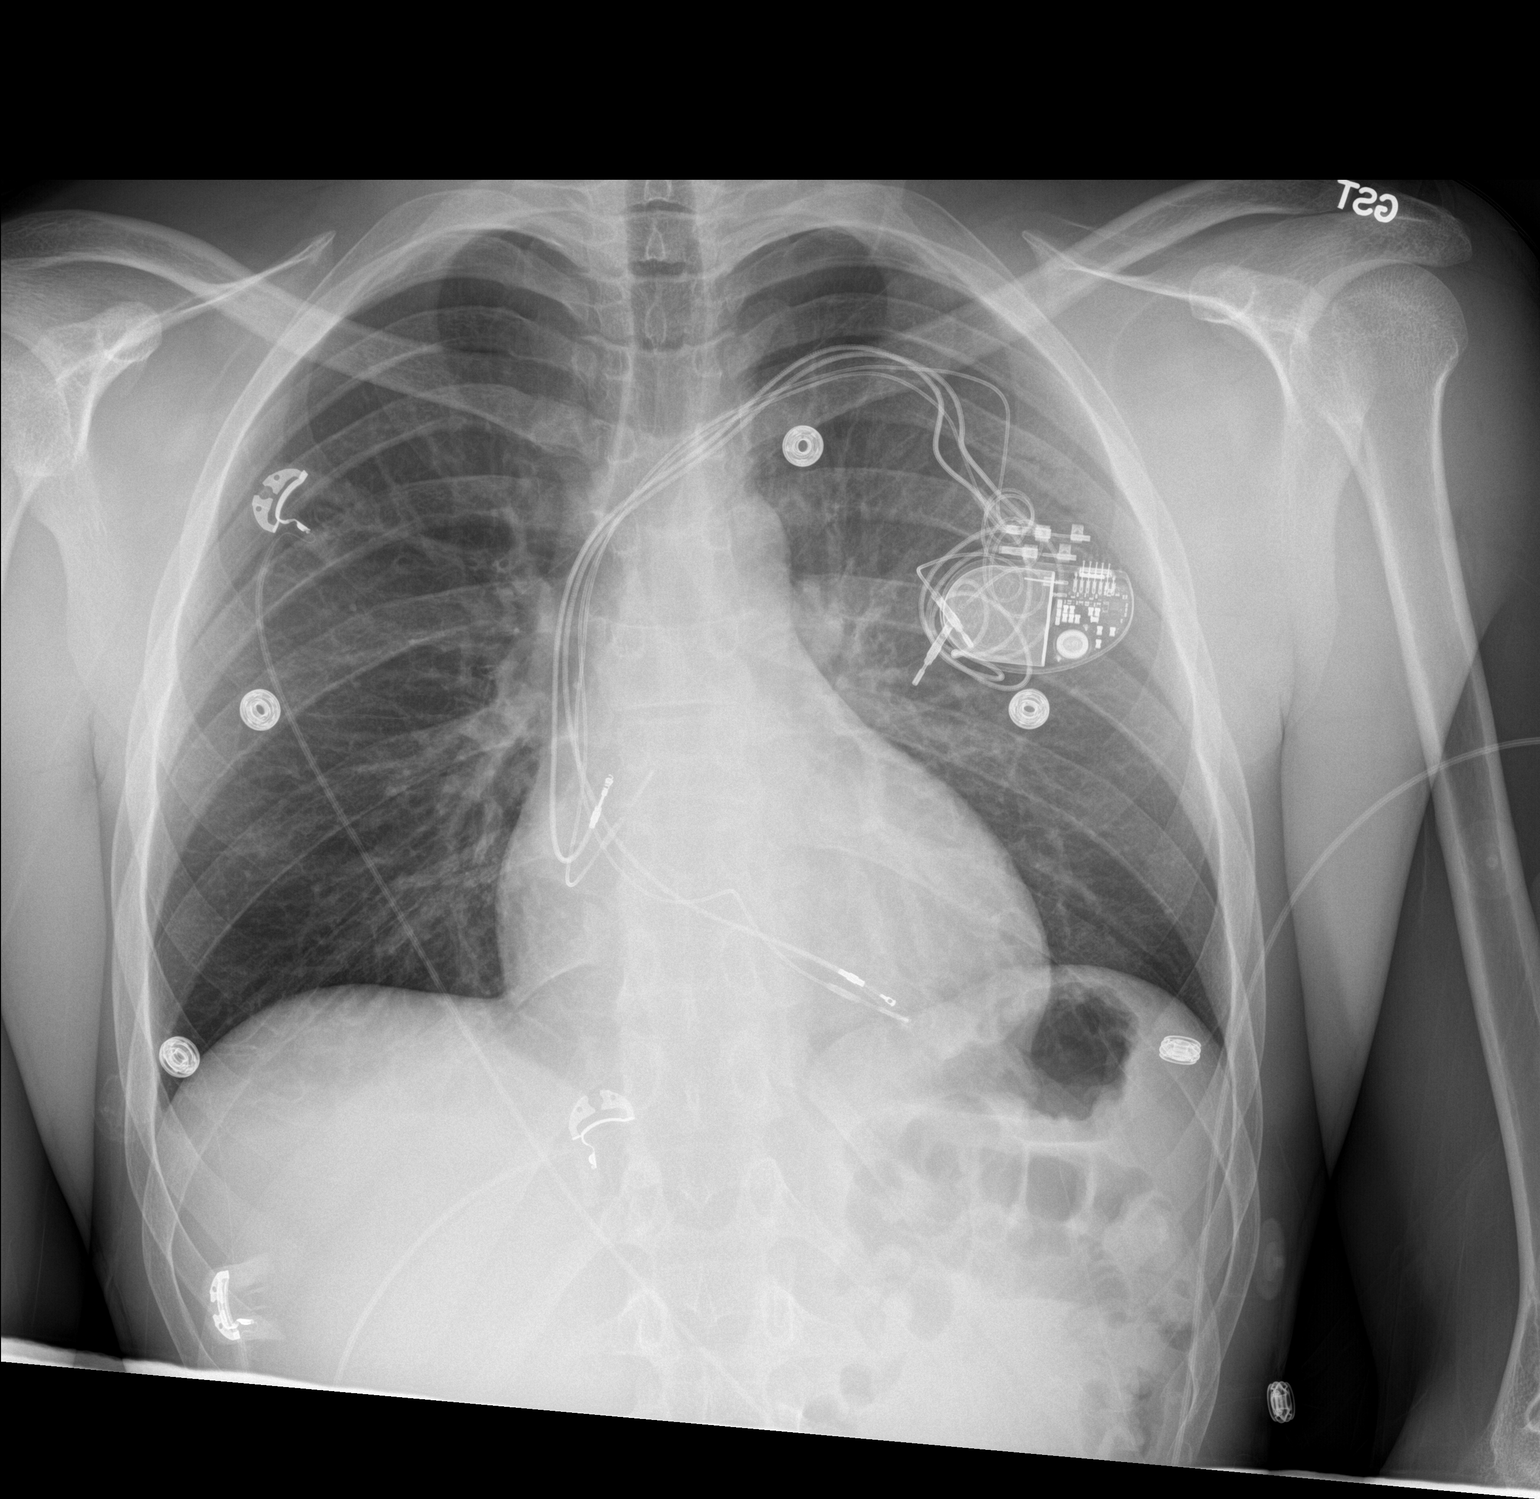

[chest lat]
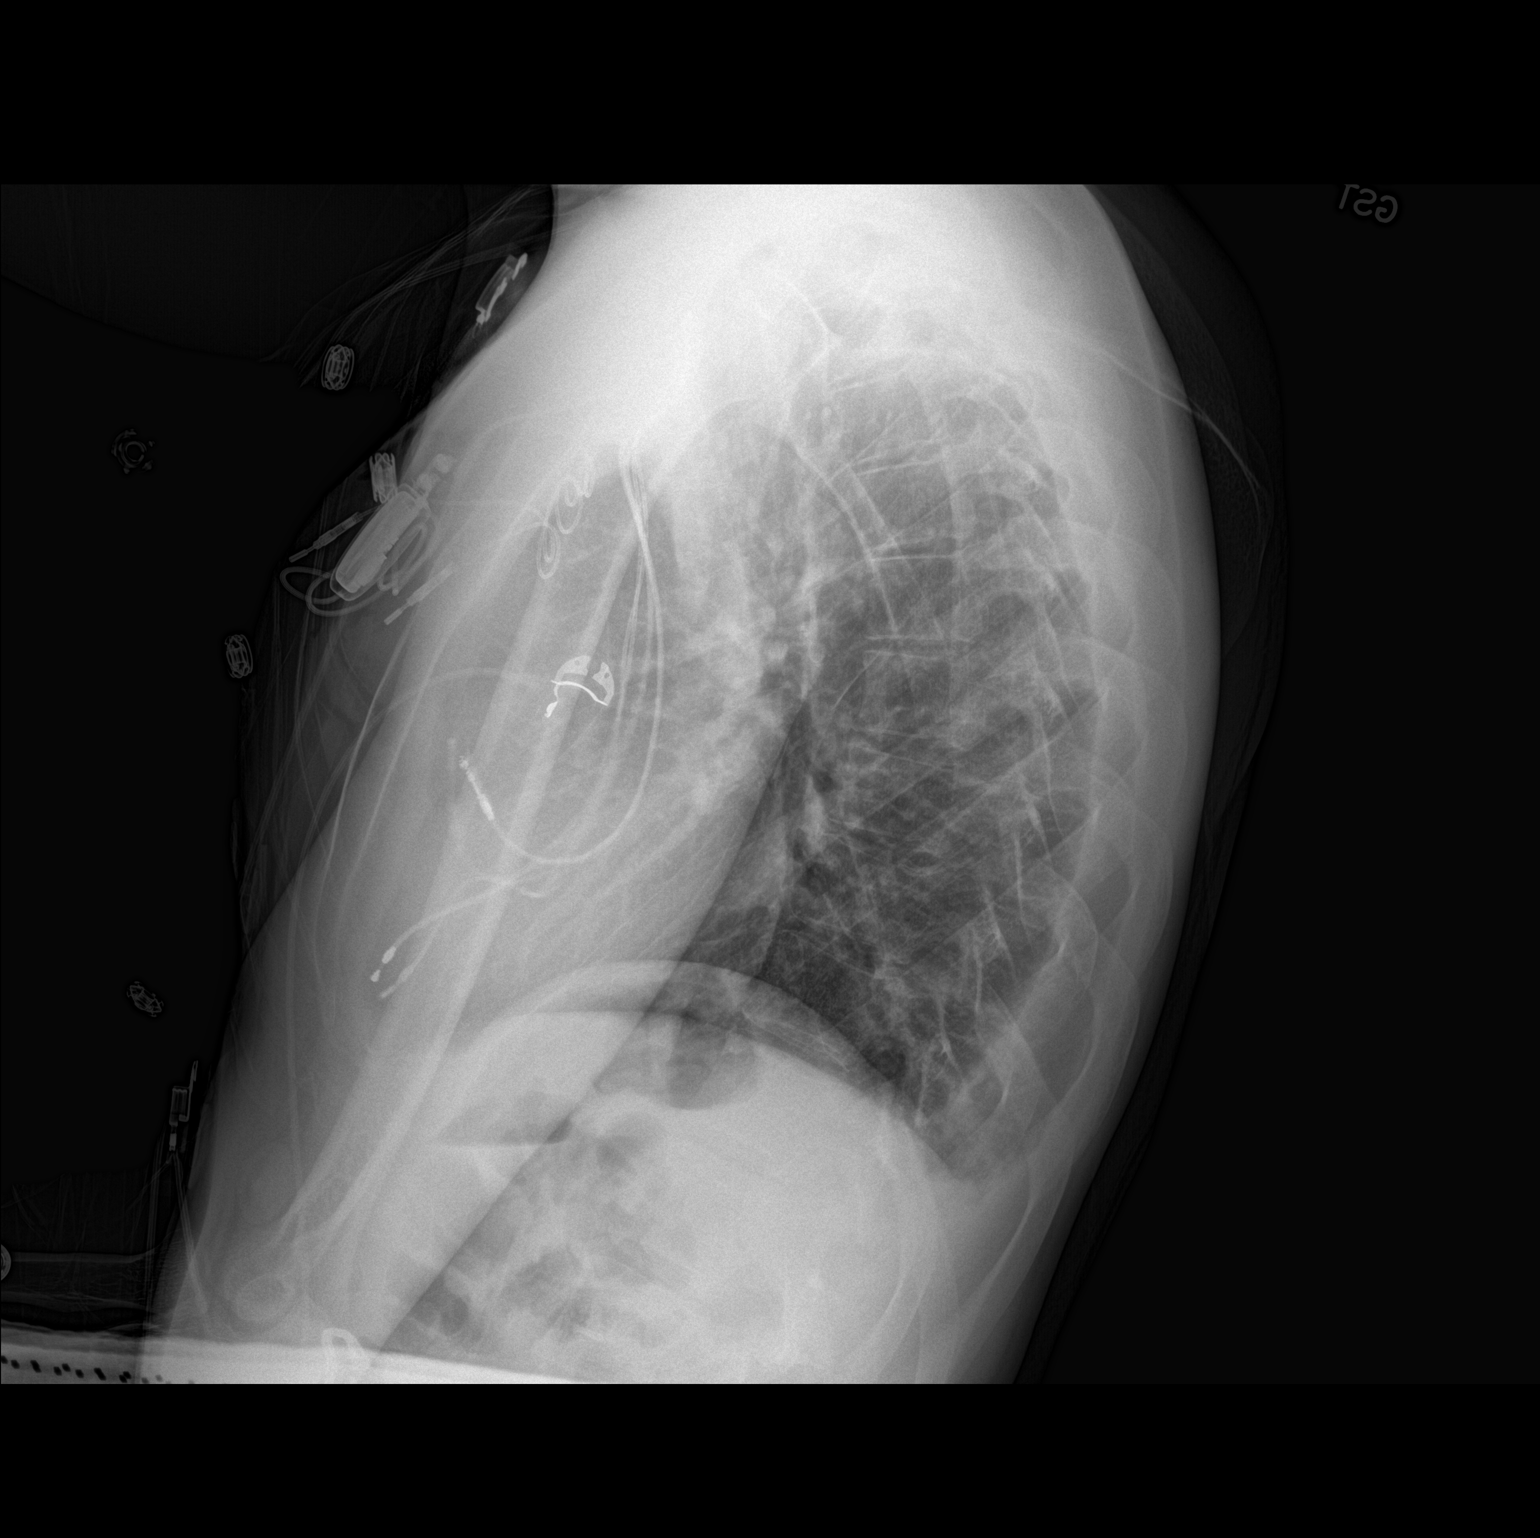

[2 of 2 positions shown; findings below may reference images not displayed]

FINDINGS: The heart size and mediastinal contours are within normal limits.
Both lungs are clear. No pneumothorax or pleural effusion is noted.
The visualized skeletal structures are unremarkable. Left-sided
pacemaker is in grossly good position.
IMPRESSION: No active cardiopulmonary disease.

## 2016-12-13 NOTE — Telephone Encounter (Signed)
Pt no showed for sleep study on 12/02/16 . Will send to [facility] to reschedule

## 2016-12-14 ENCOUNTER — Ambulatory Visit: Payer: Self-pay | Admitting: Internal Medicine

## 2017-01-03 ENCOUNTER — Ambulatory Visit (INDEPENDENT_AMBULATORY_CARE_PROVIDER_SITE_OTHER): Payer: Medicaid Other | Admitting: *Deleted

## 2017-01-03 DIAGNOSIS — Q246 Congenital heart block: Secondary | ICD-10-CM

## 2017-01-03 NOTE — Progress Notes (Signed)
Remote pacemaker transmission.   

## 2017-01-04 ENCOUNTER — Encounter: Payer: Self-pay | Admitting: Cardiology

## 2017-01-07 ENCOUNTER — Encounter: Payer: Self-pay | Admitting: Physical Therapy

## 2017-01-07 ENCOUNTER — Ambulatory Visit: Payer: Medicaid Other | Attending: Family Medicine | Admitting: Physical Therapy

## 2017-01-07 DIAGNOSIS — M545 Low back pain, unspecified: Secondary | ICD-10-CM

## 2017-01-07 DIAGNOSIS — M542 Cervicalgia: Secondary | ICD-10-CM | POA: Diagnosis present

## 2017-01-07 DIAGNOSIS — M62830 Muscle spasm of back: Secondary | ICD-10-CM | POA: Insufficient documentation
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
## 2017-01-10 ENCOUNTER — Encounter: Payer: Self-pay | Admitting: Physical Therapy

## 2017-01-10 ENCOUNTER — Ambulatory Visit: Payer: Medicaid Other | Admitting: Physical Therapy

## 2017-01-10 DIAGNOSIS — M542 Cervicalgia: Secondary | ICD-10-CM

## 2017-01-10 DIAGNOSIS — M62830 Muscle spasm of back: Secondary | ICD-10-CM

## 2017-01-10 DIAGNOSIS — M545 Low back pain, unspecified: Secondary | ICD-10-CM

## 2017-01-10 NOTE — Therapy (Signed)
[facility] [address] [place], [place], [address] Phone: [phone]   Fax:  [phone]  Physical Therapy Treatment  Patient Details  Name: Mario Wyatt MRN: [id number] Date of Birth: 1990/12/24 Referring Provider: Junius Creamer   Encounter Date: 01/10/2017  PT End of Session - 01/10/17 1650    Visit Number  2    Date for PT Re-Evaluation  03/07/17    PT Start Time  1600    PT Stop Time  1656    PT Time Calculation (min)  56 min       Past Medical History:  Diagnosis Date  . Anxiety   . Asthma   . Attention deficit hyperactivity disorder (ADHD)   . Congenital complete AV block   . Pacemaker    Placed when pt was 27 years old.  . S/P cardiac pacemaker procedure, 09/08/14, [employer] Jude medical 09/09/2014    Past Surgical History:  Procedure Laterality Date  . CARDIAC PACEMAKER PLACEMENT  2008   second degree heart block  . EP IMPLANTABLE DEVICE N/A 09/08/2014   Procedure: Pacemaker Implant;  Surgeon: Duke Salvia, MD;  Location: [facility] INVASIVE CV LAB;  Service: Cardiovascular;  Laterality: N/A;  . HERNIA REPAIR    . PACEMAKER INSERTION  1999    There were no vitals filed for this visit.  Subjective Assessment - 01/10/17 1602    Subjective  "My neck is killing me"    Currently in Pain?  Yes    Pain Score  6  Neck, back, R knee                      [facility] Adult PT Treatment/Exercise - 01/10/17 0001      Exercises   Exercises  Neck;Lumbar      Neck Exercises: Theraband   Shoulder External Rotation  15 reps x2 yellow    Horizontal ABduction  10 reps yellow x2       Neck Exercises: Standing   Neck Retraction  10 reps;3 secs      Lumbar Exercises: Stretches   Passive Hamstring Stretch  4 reps;10 seconds;Right;Left    Single Knee to Chest Stretch  3 reps;10 seconds;Left;Right    Double Knee to Chest Stretch  4 reps;10 seconds    Lower Trunk Rotation  3 reps;10 seconds      Lumbar Exercises:  Aerobic   Nustep  L4 x 6 min       Lumbar Exercises: Machines for Strengthening   Other Lumbar Machine Exercise  Rows & lats 20lb 2x10       Lumbar Exercises: Standing   Shoulder Extension  10 reps;Theraband;Both x2      Modalities   Modalities  Moist Heat      Moist Heat Therapy   Number Minutes Moist Heat  10 Minutes    Moist Heat Location  Cervical      Manual Therapy   Manual Therapy  Passive ROM;Manual Traction;Soft tissue mobilization    Soft tissue mobilization  posterior cervical para spinales.    Passive ROM  Cervial spoine all directions    Manual Traction  cervical spine 4x10''               PT Short Term Goals - 01/10/17 1650      PT SHORT TERM GOAL #1   Title  independent with initial HEP    Status  Achieved        PT  Long Term Goals - 01/07/17 1620      PT LONG TERM GOAL #1   Title  understand posture and body mechanics instruction    Time  8    Period  Weeks    Status  New      PT LONG TERM GOAL #2   Title  decrease pain 50%    Time  8    Period  Weeks    Status  New      PT LONG TERM GOAL #3   Title  increase cervical ROM 25%    Time  8    Period  Weeks    Status  New      PT LONG TERM GOAL #4   Title  increase lumbar ROM 25%    Time  8    Period  Weeks    Status  New            Plan - 01/10/17 1651    Clinical Impression Statement  Progressed pt to some low weight resistance exercises. Pt doe reports some discomfort with horizontal abduction. Pt reports low back pain with single and double knee to chest. Tenderness in posterior cervical spine noted with MT.    Rehab Potential  Good    PT Frequency  2x / week    PT Duration  8 weeks    PT Treatment/Interventions  Cryotherapy;Moist Heat;Therapeutic activities;Therapeutic exercise;Manual techniques;Patient/family education;Dry needling    PT Next Visit Plan  Start gym activities to get mms to be active and relax       Patient will benefit from skilled therapeutic  intervention in order to improve the following deficits and impairments:  Decreased mobility, Decreased strength, Postural dysfunction, Improper body mechanics, Impaired flexibility, Pain, Increased muscle spasms, Decreased range of motion  Visit Diagnosis: Acute bilateral low back pain without sciatica  Cervicalgia  Muscle spasm of back     Problem List Patient Active Problem List   Diagnosis Date Noted  . Pacemaker at end of battery life 09/09/2014  . S/P cardiac pacemaker procedure, 09/08/14, [employer] Jude medical, gen change 09/09/2014  . Complete heart block (HCC) 09/08/2014  . Adult attention deficit disorder 05/26/2014  . Cervical pain 05/26/2014  . Back pain, chronic 05/26/2014  . AV block, High Grade, congenital 06/11/2013  . Pacemaker 05/27/2013  . Vomiting 04/05/2013  . Diarrhea 04/05/2013  . Nausea 04/05/2013  . Artificial cardiac pacemaker 03/21/2011  . 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 [place], [place], [address] Phone: [phone]   Fax:  [phone]  Name: Etheridge Whitmarsh MRN: [id number] Date of Birth: 1990/03/25

## 2017-01-14 ENCOUNTER — Ambulatory Visit: Payer: Medicaid Other | Admitting: Physical Therapy

## 2017-01-16 ENCOUNTER — Ambulatory Visit: Payer: Medicaid Other | Admitting: Physical Therapy

## 2017-01-17 ENCOUNTER — Ambulatory Visit: Payer: Medicaid Other | Admitting: Physical Therapy

## 2017-01-17 ENCOUNTER — Other Ambulatory Visit (HOSPITAL_COMMUNITY): Payer: Self-pay

## 2017-01-17 ENCOUNTER — Encounter: Payer: Self-pay | Admitting: Physical Therapy

## 2017-01-17 DIAGNOSIS — M62830 Muscle spasm of back: Secondary | ICD-10-CM

## 2017-01-17 DIAGNOSIS — M545 Low back pain, unspecified: Secondary | ICD-10-CM

## 2017-01-17 DIAGNOSIS — M542 Cervicalgia: Secondary | ICD-10-CM

## 2017-01-17 NOTE — Therapy (Signed)
[facility] [place] [facility] [address] [facility] [address] [place], [place], [address] Phone: [phone]   Fax:  [phone]  Physical Therapy Treatment  Patient Details  Name: Mario Wyatt MRN: [id number] Date of Birth: June 03, 1990 Referring Provider: Junius Creamer   Encounter Date: 01/17/2017  PT End of Session - 01/17/17 1736    Visit Number  3    Date for PT Re-Evaluation  03/07/17    PT Start Time  1655    PT Stop Time  1755    PT Time Calculation (min)  60 min    Activity Tolerance  Patient tolerated treatment well    Behavior During Therapy  [facility] for tasks assessed/performed       Past Medical History:  Diagnosis Date  . Anxiety   . Asthma   . Attention deficit hyperactivity disorder (ADHD)   . Congenital complete AV block   . Pacemaker    Placed when pt was 27 years old.  . S/P cardiac pacemaker procedure, 09/08/14, St Jude medical 09/09/2014    Past Surgical History:  Procedure Laterality Date  . CARDIAC PACEMAKER PLACEMENT  2008   second degree heart block  . EP IMPLANTABLE DEVICE N/A 09/08/2014   Procedure: Pacemaker Implant;  Surgeon: Duke Salvia, MD;  Location: [facility] INVASIVE CV LAB;  Service: Cardiovascular;  Laterality: N/A;  . HERNIA REPAIR    . PACEMAKER INSERTION  1999    There were no vitals filed for this visit.  Subjective Assessment - 01/17/17 1704    Subjective  I get very sore atthe end of the day.  The heat feels good    Currently in Pain?  Yes    Pain Location  Back neck                      [facility] Adult PT Treatment/Exercise - 01/17/17 0001      Neck Exercises: Theraband   Shoulder External Rotation  20 reps;Green    Horizontal ABduction  20 reps;Green      Neck Exercises: Supine   Other Supine Exercise  head on ball resisted cervical retraction, then small rotation with retractions, then weighted ball lifts with head on ball      Lumbar Exercises: Stretches   Passive Hamstring Stretch  4  reps;10 seconds;Right;Left    Lower Trunk Rotation  3 reps;10 seconds      Lumbar Exercises: Aerobic   Nustep  L4 x 6 min       Lumbar Exercises: Machines for Strengthening   Other Lumbar Machine Exercise  Rows & lats 20lb 2x10       Moist Heat Therapy   Number Minutes Moist Heat  10 Minutes    Moist Heat Location  Cervical      Manual Therapy   Manual Therapy  Passive ROM;Manual Traction;Soft tissue mobilization    Soft tissue mobilization  posterior cervical para spinales.    Passive ROM  Cervial spine all directions, gentle over pressures and some contract relax    Manual Traction  cervical spine 4x10''               PT Short Term Goals - 01/10/17 1650      PT SHORT TERM GOAL #1   Title  independent with initial HEP    Status  Achieved        PT Long Term Goals - 01/17/17 1740      PT LONG TERM GOAL #1  Title  understand posture and body mechanics instruction    Status  On-going      PT LONG TERM GOAL #2   Title  decrease pain 50%    Status  On-going            Plan - 01/17/17 1736    Clinical Impression Statement  Patient with a "catch" in the cervical area, tried some contract relax, very tender in the right cervical area.  He is gaurded with many of his motions    PT Next Visit Plan  see if the contract relax cervical helped or made him sore    Consulted and Agree with Plan of Care  Patient       Patient will benefit from skilled therapeutic intervention in order to improve the following deficits and impairments:  Decreased mobility, Decreased strength, Postural dysfunction, Improper body mechanics, Impaired flexibility, Pain, Increased muscle spasms, Decreased range of motion  Visit Diagnosis: Acute bilateral low back pain without sciatica  Cervicalgia  Muscle spasm of back     Problem List Patient Active Problem List   Diagnosis Date Noted  . Pacemaker at end of battery life 09/09/2014  . S/P cardiac pacemaker procedure, 09/08/14,  St Jude medical, gen change 09/09/2014  . Complete heart block (HCC) 09/08/2014  . Adult attention deficit disorder 05/26/2014  . Cervical pain 05/26/2014  . Back pain, chronic 05/26/2014  . AV block, High Grade, congenital 06/11/2013  . Pacemaker 05/27/2013  . Vomiting 04/05/2013  . Diarrhea 04/05/2013  . Nausea 04/05/2013  . Artificial cardiac pacemaker 03/21/2011  . 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 Delusional disorder(297.1) 03/15/2011    Class: Acute    Jearld Lesch., PT 01/17/2017, 5:40 PM  [facility] [address] [place], [place], [address] Phone: [phone]   Fax:  [phone]  Name: Mario Wyatt MRN: [id number] Date of Birth: 1990/11/05

## 2017-01-18 LAB — CUP PACEART REMOTE DEVICE CHECK
Battery Voltage: 2.99 V
Brady Statistic AP VP Percent: 31 %
Brady Statistic AP VS Percent: 1 %
Brady Statistic AS VP Percent: 69 %
Brady Statistic AS VS Percent: 1 %
Implantable Lead Implant Date: 20160907
Implantable Lead Implant Date: 20160907
Implantable Lead Model: 1948
Implantable Pulse Generator Implant Date: 20160907
Lead Channel Impedance Value: 760 Ohm
Lead Channel Pacing Threshold Amplitude: 0.875 V
Lead Channel Pacing Threshold Amplitude: 0.875 V
Lead Channel Pacing Threshold Pulse Width: 0.4 ms
Lead Channel Setting Pacing Amplitude: 1.125
Lead Channel Setting Pacing Pulse Width: 0.4 ms
MDC IDC LEAD LOCATION: 753859
MDC IDC LEAD LOCATION: 753860
MDC IDC MSMT BATTERY REMAINING LONGEVITY: 124 mo
MDC IDC MSMT BATTERY REMAINING PERCENTAGE: 95.5 %
MDC IDC MSMT LEADCHNL RA IMPEDANCE VALUE: 480 Ohm
MDC IDC MSMT LEADCHNL RA PACING THRESHOLD PULSEWIDTH: 0.4 ms
MDC IDC MSMT LEADCHNL RA SENSING INTR AMPL: 1.4 mV
MDC IDC MSMT LEADCHNL RV SENSING INTR AMPL: 12 mV
MDC IDC PG SERIAL: 7787423
MDC IDC SESS DTM: 20190103090015
MDC IDC SET LEADCHNL RA PACING AMPLITUDE: 1.875
MDC IDC SET LEADCHNL RV SENSING SENSITIVITY: 4 mV
MDC IDC STAT BRADY RA PERCENT PACED: 31 %
MDC IDC STAT BRADY RV PERCENT PACED: 99 %

## 2017-01-21 ENCOUNTER — Encounter: Payer: Self-pay | Admitting: Physical Therapy

## 2017-01-21 ENCOUNTER — Ambulatory Visit: Payer: Medicaid Other | Admitting: Physical Therapy

## 2017-01-21 ENCOUNTER — Other Ambulatory Visit (HOSPITAL_COMMUNITY): Payer: Self-pay

## 2017-01-21 DIAGNOSIS — M62830 Muscle spasm of back: Secondary | ICD-10-CM

## 2017-01-21 DIAGNOSIS — M545 Low back pain, unspecified: Secondary | ICD-10-CM

## 2017-01-21 DIAGNOSIS — M542 Cervicalgia: Secondary | ICD-10-CM

## 2017-01-21 NOTE — Therapy (Signed)
[facility] [place] [facility] [address] [place], [place], [address] Phone: [phone]   Fax:  [phone]  Physical Therapy Treatment  Patient Details  Name: Mario Wyatt MRN: [id number] Date of Birth: [birth year] Referring Provider: Junius Creamer   Encounter Date: 01/21/2017  PT End of Session - 01/21/17 1646    Visit Number  4    Date for PT Re-Evaluation  03/07/17    PT Start Time  1615    PT Stop Time  1655    PT Time Calculation (min)  40 min       Past Medical History:  Diagnosis Date  . Anxiety   . Asthma   . Attention deficit hyperactivity disorder (ADHD)   . Congenital complete AV block   . Pacemaker    Placed when pt was 27 years old.  . S/P cardiac pacemaker procedure, 09/08/14, St Jude medical 09/09/2014    Past Surgical History:  Procedure Laterality Date  . CARDIAC PACEMAKER PLACEMENT  2008   second degree heart block  . EP IMPLANTABLE DEVICE N/A 09/08/2014   Procedure: Pacemaker Implant;  Surgeon: Duke Salvia, MD;  Location: [facility] INVASIVE CV LAB;  Service: Cardiovascular;  Laterality: N/A;  . HERNIA REPAIR    . PACEMAKER INSERTION  1999    There were no vitals filed for this visit.  Subjective Assessment - 01/21/17 1616    Subjective  Pt reports a pain in neck and lower back. he stated that he was sitting down at work and bent over causing a sharp pain in lower back.    Currently in Pain?  Yes    Pain Score  6                       [facility] Adult PT Treatment/Exercise - 01/21/17 0001      Neck Exercises: Theraband   Shoulder External Rotation  20 reps;Green    Horizontal ABduction  20 reps;Green      Neck Exercises: Supine   Other Supine Exercise  head on ball resisted cervical retraction, then small rotation with retractions, then weighted ball lifts with head on ball      Lumbar Exercises: Aerobic   Nustep  L4 x 6 min       Lumbar Exercises: Machines for Strengthening   Other Lumbar  Machine Exercise  Rows & lats 25lb 2x10       Lumbar Exercises: Standing   Other Standing Lumbar Exercises  Hip abd and ext 2lb x10 each       Moist Heat Therapy   Number Minutes Moist Heat  10 Minutes    Moist Heat Location  Cervical      Manual Therapy   Manual Therapy  Passive ROM;Manual Traction;Soft tissue mobilization    Soft tissue mobilization  posterior cervical para spinales.    Passive ROM  Cervial spine all directions, gentle over pressures and some contract relax    Manual Traction  cervical spine 4x10''               PT Short Term Goals - 01/10/17 1650      PT SHORT TERM GOAL #1   Title  independent with initial HEP    Status  Achieved        PT Long Term Goals - 01/17/17 1740      PT LONG TERM GOAL #1   Title  understand posture and body mechanics instruction  Status  On-going      PT LONG TERM GOAL #2   Title  decrease pain 50%    Status  On-going            Plan - 01/21/17 1647    Clinical Impression Statement  Pt ~ 15 minutes late for today's session. Able to reach full PROM during MT. Tolerated increase weight with rows and lats. some tenderness noted in posterior para spinales.     Rehab Potential  Good    PT Frequency  2x / week    PT Duration  8 weeks    PT Treatment/Interventions  Cryotherapy;Moist Heat;Therapeutic activities;Therapeutic exercise;Manual techniques;Patient/family education;Dry needling    PT Next Visit Plan  assess Tx,       Patient will benefit from skilled therapeutic intervention in order to improve the following deficits and impairments:  Decreased mobility, Decreased strength, Postural dysfunction, Improper body mechanics, Impaired flexibility, Pain, Increased muscle spasms, Decreased range of motion  Visit Diagnosis: Cervicalgia  Acute bilateral low back pain without sciatica  Muscle spasm of back     Problem List Patient Active Problem List   Diagnosis Date Noted  . Pacemaker at end of battery  life 09/09/2014  . S/P cardiac pacemaker procedure, 09/08/14, St Jude medical, gen change 09/09/2014  . Complete heart block (HCC) 09/08/2014  . Adult attention deficit disorder 05/26/2014  . Cervical pain 05/26/2014  . Back pain, chronic 05/26/2014  . AV block, High Grade, congenital 06/11/2013  . Pacemaker 05/27/2013  . Vomiting 04/05/2013  . Diarrhea 04/05/2013  . Nausea 04/05/2013  . Artificial cardiac pacemaker 03/21/2011  . 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 Delusional disorder(297.1) 03/15/2011    Class: Acute    Grayce Sessions, PTA 01/21/2017, 4:53 PM  [facility] [place] [facility] [address] [place], [place], [address] Phone: [phone]   Fax:  [phone]  Name: Pepe Mineau MRN: [id number] Date of Birth: [birth year]

## 2017-01-24 ENCOUNTER — Ambulatory Visit: Payer: Medicaid Other | Admitting: Physical Therapy

## 2017-01-24 ENCOUNTER — Ambulatory Visit (HOSPITAL_COMMUNITY): Payer: Medicaid Other | Attending: Cardiology

## 2017-01-24 ENCOUNTER — Encounter: Payer: Self-pay | Admitting: Physical Therapy

## 2017-01-24 ENCOUNTER — Other Ambulatory Visit: Payer: Self-pay

## 2017-01-24 DIAGNOSIS — I081 Rheumatic disorders of both mitral and tricuspid valves: Secondary | ICD-10-CM | POA: Insufficient documentation

## 2017-01-24 DIAGNOSIS — Z950 Presence of cardiac pacemaker: Secondary | ICD-10-CM | POA: Diagnosis not present

## 2017-01-24 DIAGNOSIS — Q246 Congenital heart block: Secondary | ICD-10-CM | POA: Insufficient documentation

## 2017-01-24 DIAGNOSIS — J45909 Unspecified asthma, uncomplicated: Secondary | ICD-10-CM | POA: Diagnosis not present

## 2017-01-24 DIAGNOSIS — Z87891 Personal history of nicotine dependence: Secondary | ICD-10-CM | POA: Insufficient documentation

## 2017-01-24 DIAGNOSIS — M542 Cervicalgia: Secondary | ICD-10-CM

## 2017-01-24 DIAGNOSIS — M62830 Muscle spasm of back: Secondary | ICD-10-CM

## 2017-01-24 DIAGNOSIS — I429 Cardiomyopathy, unspecified: Secondary | ICD-10-CM | POA: Insufficient documentation

## 2017-01-24 DIAGNOSIS — M545 Low back pain, unspecified: Secondary | ICD-10-CM

## 2017-01-24 NOTE — Patient Instructions (Signed)

## 2017-01-24 NOTE — Therapy (Signed)
[facility] [address] [place], [place], [address] Phone: [phone]   Fax:  [phone]  Physical Therapy Treatment  Patient Details  Name: Mario Wyatt MRN: [id number] Date of Birth: 27-May-1990 Referring Provider: Delle Reining   Encounter Date: 01/24/2017  PT End of Session - 01/24/17 1739    Visit Number  6    Date for PT Re-Evaluation  03/07/17    PT Start Time  7253    PT Stop Time  1620    PT Time Calculation (min)  50 min    Activity Tolerance  Patient tolerated treatment well    Behavior During Therapy  [facility] for tasks assessed/performed       Past Medical History:  Diagnosis Date  . Anxiety   . Asthma   . Attention deficit hyperactivity disorder (ADHD)   . Congenital complete AV block   . Pacemaker    Placed when pt was 27 years old.  . S/P cardiac pacemaker procedure, 09/08/14, St Jude medical 09/09/2014    Past Surgical History:  Procedure Laterality Date  . CARDIAC PACEMAKER PLACEMENT  2008   second degree heart block  . EP IMPLANTABLE DEVICE N/A 09/08/2014   Procedure: Pacemaker Implant;  Surgeon: Deboraha Sprang, MD;  Location: [facility] CV LAB;  Service: Cardiovascular;  Laterality: N/A;  . HERNIA REPAIR    . PACEMAKER INSERTION  1999    There were no vitals filed for this visit.  Subjective Assessment - 01/24/17 1724    Subjective  Patient reports that he is feeling a little better.    Currently in Pain?  Yes    Pain Score  5     Pain Location  Back    Pain Orientation  Lower;Upper                      [facility] Adult PT Treatment/Exercise - 01/24/17 0001      Lumbar Exercises: Aerobic   Nustep  L4 x 6 min       Lumbar Exercises: Machines for Strengthening   Cybex Knee Extension  10# 3x10    Cybex Knee Flexion  25# 3x10    Leg Press  20# 2x10    Other Lumbar Machine Exercise  Rows & lats 25lb 2x10       Lumbar Exercises: Standing   Shoulder Extension  10  reps;Theraband;Both    Other Standing Lumbar Exercises  Hip abd and ext 2lb x10 each       Moist Heat Therapy   Number Minutes Moist Heat  15 Minutes    Moist Heat Location  Cervical      Manual Therapy   Manual Therapy  Passive ROM;Manual Traction;Soft tissue mobilization    Soft tissue mobilization  posterior cervical para spinales.    Passive ROM  Cervial spine all directions, gentle over pressures and some contract relax    Manual Traction  cervical spine 4x10''       Trigger Point Dry Needling - 01/24/17 1738    Consent Given?  Yes    Education Handout Provided  Yes    Muscles Treated Upper Body  Upper trapezius;Levator scapulae;Rhomboids    Upper Trapezius Response  Twitch reponse elicited    Levator Scapulae Response  Twitch response elicited    Rhomboids Response  Twitch response elicited             PT Short Term Goals - 01/10/17 1650  PT SHORT TERM GOAL #1   Title  independent with initial HEP    Status  Achieved        PT Long Term Goals - 01/24/17 1745      PT LONG TERM GOAL #1   Title  understand posture and body mechanics instruction    Status  Partially Met      PT LONG TERM GOAL #3   Title  increase cervical ROM 25%    Status  Partially Met      PT LONG TERM GOAL #4   Title  increase lumbar ROM 25%    Status  On-going            Plan - 01/24/17 1735    Clinical Impression Statement  Patient reports overall doing better but still with neck and back pain, has some spasms in the upper trap and the rhomboid area.  He is moving a little better.    PT Next Visit Plan  tried dry needling today, see if he felt like it helped, advance exercises as tolerated    Consulted and Agree with Plan of Care  Patient       Patient will benefit from skilled therapeutic intervention in order to improve the following deficits and impairments:  Decreased mobility, Decreased strength, Postural dysfunction, Improper body mechanics, Impaired flexibility,  Pain, Increased muscle spasms, Decreased range of motion  Visit Diagnosis: Cervicalgia  Acute bilateral low back pain without sciatica  Muscle spasm of back     Problem List Patient Active Problem List   Diagnosis Date Noted  . Pacemaker at end of battery life 09/09/2014  . S/P cardiac pacemaker procedure, 09/08/14, St Jude medical, gen change 09/09/2014  . Complete heart block ([place]) 09/08/2014  . Adult attention deficit disorder 05/26/2014  . Cervical pain 05/26/2014  . Back pain, chronic 05/26/2014  . AV block, High Grade, congenital 06/11/2013  . Pacemaker 05/27/2013  . Vomiting 04/05/2013  . Diarrhea 04/05/2013  . Nausea 04/05/2013  . Artificial cardiac pacemaker 03/21/2011  . 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 Delusional disorder(297.1) 03/15/2011    Class: Acute    Sumner Boast., PT 01/24/2017, 5:46 PM  [place] [facility] [address] [facility] [place], [place], [address] Phone: [phone]   Fax:  [phone]  Name: Mario Wyatt MRN: [id number] Date of Birth: December 05, 1990

## 2017-01-31 ENCOUNTER — Encounter: Payer: Self-pay | Admitting: Physical Therapy

## 2017-01-31 ENCOUNTER — Ambulatory Visit: Payer: Medicaid Other | Admitting: Physical Therapy

## 2017-01-31 DIAGNOSIS — M545 Low back pain, unspecified: Secondary | ICD-10-CM

## 2017-01-31 DIAGNOSIS — M542 Cervicalgia: Secondary | ICD-10-CM

## 2017-01-31 DIAGNOSIS — M62830 Muscle spasm of back: Secondary | ICD-10-CM

## 2017-01-31 NOTE — Therapy (Signed)
[facility] [place] [address] [place], [place], [address] Phone: [phone]   Fax:  [phone]  Physical Therapy Treatment  Patient Details  Name: Mario Wyatt MRN: [id number] Date of Birth: 16-May-1990 Referring Provider: Junius Creamer   Encounter Date: 01/31/2017  PT End of Session - 01/31/17 1650    Visit Number  7    Date for PT Re-Evaluation  03/07/17    PT Start Time  1530    PT Stop Time  1618    PT Time Calculation (min)  48 min    Activity Tolerance  Patient tolerated treatment well    Behavior During Therapy  [facility] for tasks assessed/performed       Past Medical History:  Diagnosis Date  . Anxiety   . Asthma   . Attention deficit hyperactivity disorder (ADHD)   . Congenital complete AV block   . Pacemaker    Placed when pt was 27 years old.  . S/P cardiac pacemaker procedure, 09/08/14, St Jude medical 09/09/2014    Past Surgical History:  Procedure Laterality Date  . CARDIAC PACEMAKER PLACEMENT  2008   second degree heart block  . EP IMPLANTABLE DEVICE N/A 09/08/2014   Procedure: Pacemaker Implant;  Surgeon: Duke Salvia, MD;  Location: [facility] INVASIVE CV LAB;  Service: Cardiovascular;  Laterality: N/A;  . HERNIA REPAIR    . PACEMAKER INSERTION  1999    There were no vitals filed for this visit.  Subjective Assessment - 01/31/17 1632    Subjective  Patient reports that the dry needling was pretty good.  He reports that he has been doing a lot of work this week and has had to do some lifting and is really hurting the past two days    Currently in Pain?  Yes    Pain Score  7     Pain Location  Neck    Pain Orientation  Right    Pain Descriptors / Indicators  Shooting    Aggravating Factors   work                      [facility] Adult PT Treatment/Exercise - 01/31/17 0001      Modalities   Modalities  Traction;Ultrasound      Ultrasound   Ultrasound Location  right upper trap area    Ultrasound Parameters  1.4w/cm2 100%    Ultrasound Goals  Pain      Traction   Type of Traction  Cervical    Min (lbs)  15    Hold Time  static    Time  15      Manual Therapy   Manual Therapy  Passive ROM;Manual Traction;Soft tissue mobilization    Soft tissue mobilization  posterior cervical para spinals, right upper trap. levator and rhomboid area               PT Short Term Goals - 01/10/17 1650      PT SHORT TERM GOAL #1   Title  independent with initial HEP    Status  Achieved        PT Long Term Goals - 01/31/17 1651      PT LONG TERM GOAL #1   Title  understand posture and body mechanics instruction    Status  Achieved            Plan - 01/31/17 1650    Clinical Impression Statement  Patient with increased pain  due to work, tried [place] and traction today.  He does have a large trigger point in the right upper trap and levator    PT Next Visit Plan  see if any of the new modalities helped, return to exercises and or MD    Consulted and Agree with Plan of Care  Patient       Patient will benefit from skilled therapeutic intervention in order to improve the following deficits and impairments:  Decreased mobility, Decreased strength, Postural dysfunction, Improper body mechanics, Impaired flexibility, Pain, Increased muscle spasms, Decreased range of motion  Visit Diagnosis: Cervicalgia  Acute bilateral low back pain without sciatica  Muscle spasm of back     Problem List Patient Active Problem List   Diagnosis Date Noted  . Pacemaker at end of battery life 09/09/2014  . S/P cardiac pacemaker procedure, 09/08/14, St Jude medical, gen change 09/09/2014  . Complete heart block (HCC) 09/08/2014  . Adult attention deficit disorder 05/26/2014  . Cervical pain 05/26/2014  . Back pain, chronic 05/26/2014  . AV block, High Grade, congenital 06/11/2013  . Pacemaker 05/27/2013  . Vomiting 04/05/2013  . Diarrhea 04/05/2013  . Nausea 04/05/2013  .  Artificial cardiac pacemaker 03/21/2011  . 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 Delusional disorder(297.1) 03/15/2011    Class: Acute    Jearld Lesch., PT 01/31/2017, 5:12 PM  [facility] [place] [facility] [address] [place], [place], [address] Phone: [phone]   Fax:  [phone]  Name: Mario Wyatt MRN: [id number] Date of Birth: [birth year]

## 2017-02-07 ENCOUNTER — Encounter: Payer: Self-pay | Admitting: Physical Therapy

## 2017-02-07 ENCOUNTER — Ambulatory Visit: Payer: Medicaid Other | Attending: Family Medicine | Admitting: Physical Therapy

## 2017-02-07 DIAGNOSIS — M542 Cervicalgia: Secondary | ICD-10-CM

## 2017-02-07 DIAGNOSIS — M545 Low back pain, unspecified: Secondary | ICD-10-CM

## 2017-02-07 DIAGNOSIS — M62830 Muscle spasm of back: Secondary | ICD-10-CM | POA: Insufficient documentation

## 2017-02-07 NOTE — Therapy (Signed)
[facility] [place] [facility] [address] [place], [place], [address] Phone: [phone]   Fax:  [phone]  Physical Therapy Treatment  Patient Details  Name: Mario Wyatt MRN: [id number] Date of Birth: Sep 17, 1990 Referring Provider: Junius Creamer   Encounter Date: 02/07/2017  PT End of Session - 02/07/17 1500    Visit Number  8    Date for PT Re-Evaluation  03/07/17    PT Start Time  1430    PT Stop Time  1518    PT Time Calculation (min)  48 min    Activity Tolerance  Patient tolerated treatment well    Behavior During Therapy  [facility] for tasks assessed/performed       Past Medical History:  Diagnosis Date  . Anxiety   . Asthma   . Attention deficit hyperactivity disorder (ADHD)   . Congenital complete AV block   . Pacemaker    Placed when pt was 27 years old.  . S/P cardiac pacemaker procedure, 09/08/14, St Jude medical 09/09/2014    Past Surgical History:  Procedure Laterality Date  . CARDIAC PACEMAKER PLACEMENT  2008   second degree heart block  . EP IMPLANTABLE DEVICE N/A 09/08/2014   Procedure: Pacemaker Implant;  Surgeon: Duke Salvia, MD;  Location: [facility] INVASIVE CV LAB;  Service: Cardiovascular;  Laterality: N/A;  . HERNIA REPAIR    . PACEMAKER INSERTION  1999    There were no vitals filed for this visit.  Subjective Assessment - 02/07/17 1430    Subjective  Pt reports that he has been doing stretches, Pt reports a stinging pain in his necks. Pt reports that dry needling helps    Currently in Pain?  Yes    Pain Score  8     Pain Location  Neck                      [facility] Adult PT Treatment/Exercise - 02/07/17 0001      Lumbar Exercises: Aerobic   Nustep  L3 x 6 min       Modalities   Modalities  Traction;Ultrasound      Ultrasound   Ultrasound Location  R upper trap    Ultrasound Goals  Pain      Traction   Type of Traction  Cervical    Min (lbs)  15    Hold Time  static    Time  15       Manual Therapy   Manual Therapy  Passive ROM;Manual Traction;Soft tissue mobilization    Soft tissue mobilization  posterior cervical para spinals, right upper trap. levator and rhomboid area               PT Short Term Goals - 01/10/17 1650      PT SHORT TERM GOAL #1   Title  independent with initial HEP    Status  Achieved        PT Long Term Goals - 01/31/17 1651      PT LONG TERM GOAL #1   Title  understand posture and body mechanics instruction    Status  Achieved            Plan - 02/07/17 1503    Clinical Impression Statement  Continues with [place] and traction due to pt reports of relief from last treatment. Large trigger point in the R upper trap ands levator.    Rehab Potential  Good    PT  Frequency  2x / week    PT Treatment/Interventions  Cryotherapy;Moist Heat;Therapeutic activities;Therapeutic exercise;Manual techniques;Patient/family education;Dry needling    PT Next Visit Plan  see if any of the new modalities helped, return to exercises and or MD       Patient will benefit from skilled therapeutic intervention in order to improve the following deficits and impairments:  Decreased mobility, Decreased strength, Postural dysfunction, Improper body mechanics, Impaired flexibility, Pain, Increased muscle spasms, Decreased range of motion  Visit Diagnosis: Muscle spasm of back  Acute bilateral low back pain without sciatica  Cervicalgia     Problem List Patient Active Problem List   Diagnosis Date Noted  . Pacemaker at end of battery life 09/09/2014  . S/P cardiac pacemaker procedure, 09/08/14, St Jude medical, gen change 09/09/2014  . Complete heart block (HCC) 09/08/2014  . Adult attention deficit disorder 05/26/2014  . Cervical pain 05/26/2014  . Back pain, chronic 05/26/2014  . AV block, High Grade, congenital 06/11/2013  . Pacemaker 05/27/2013  . Vomiting 04/05/2013  . Diarrhea 04/05/2013  . Nausea 04/05/2013  . Artificial cardiac  pacemaker 03/21/2011  . 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 Delusional disorder(297.1) 03/15/2011    Class: Acute    Grayce Sessions, PTA 02/07/2017, 3:06 PM  [facility] [place] [facility] [address] [place], [place], [address] Phone: [phone]   Fax:  [phone]  Name: Mario Wyatt MRN: [id number] Date of Birth: Aug 09, 1990

## 2017-02-08 ENCOUNTER — Encounter: Payer: Self-pay | Admitting: Internal Medicine

## 2017-02-11 ENCOUNTER — Encounter: Payer: Self-pay | Admitting: Physical Therapy

## 2017-02-11 ENCOUNTER — Ambulatory Visit: Payer: Medicaid Other | Admitting: Physical Therapy

## 2017-02-13 ENCOUNTER — Encounter: Payer: Self-pay | Admitting: Physical Therapy

## 2017-02-13 ENCOUNTER — Ambulatory Visit: Payer: Medicaid Other | Admitting: Physical Therapy

## 2017-02-13 DIAGNOSIS — M62830 Muscle spasm of back: Secondary | ICD-10-CM

## 2017-02-13 DIAGNOSIS — M542 Cervicalgia: Secondary | ICD-10-CM

## 2017-02-13 DIAGNOSIS — M545 Low back pain, unspecified: Secondary | ICD-10-CM

## 2017-02-13 NOTE — Therapy (Signed)
[facility] [address] [place], [place], [address] Phone: [phone]   Fax:  [phone]  Physical Therapy Treatment  Patient Details  Name: Mario Wyatt MRN: [id number] Date of Birth: [birth year] Referring Provider: Junius Creamer   Encounter Date: 02/13/2017  PT End of Session - 02/13/17 1605    Visit Number  9    Date for PT Re-Evaluation  03/07/17    PT Start Time  1515    PT Stop Time  1600    PT Time Calculation (min)  45 min    Activity Tolerance  Patient tolerated treatment well    Behavior During Therapy  [facility] for tasks assessed/performed       Past Medical History:  Diagnosis Date  . Anxiety   . Asthma   . Attention deficit hyperactivity disorder (ADHD)   . Congenital complete AV block   . Pacemaker    Placed when pt was 27 years old.  . S/P cardiac pacemaker procedure, 09/08/14, St Jude medical 09/09/2014    Past Surgical History:  Procedure Laterality Date  . CARDIAC PACEMAKER PLACEMENT  2008   second degree heart block  . EP IMPLANTABLE DEVICE N/A 09/08/2014   Procedure: Pacemaker Implant;  Surgeon: Duke Salvia, MD;  Location: [facility] INVASIVE CV LAB;  Service: Cardiovascular;  Laterality: N/A;  . HERNIA REPAIR    . PACEMAKER INSERTION  1999    There were no vitals filed for this visit.  Subjective Assessment - 02/13/17 1516    Subjective  Pt reports a pain in his neck, A sharp pain when her turns his head to the R. Pt also reports low back pain when picking up objects at work.    Currently in Pain?  Yes    Pain Score  7     Pain Location  Neck    Pain Orientation  Right;Left                      [facility] Adult PT Treatment/Exercise - 02/13/17 0001      Neck Exercises: Machines for Strengthening   UBE (Upper Arm Bike)  L3 19frd/3rev      Neck Exercises: Standing   Other Standing Exercises  Horizontab shoulder abd yellow  2x10    Other Standing Exercises  Shoulder ER red 2x10       Lumbar Exercises: Machines for Strengthening   Cybex Knee Extension  10# 3x10    Cybex Knee Flexion  25# 3x10    Other Lumbar Machine Exercise  Rows & lats 25lb 2x10       Lumbar Exercises: Standing   Other Standing Lumbar Exercises  functional box carry      Modalities   Modalities  Traction;Ultrasound      Ultrasound   Ultrasound Location  R uper trap    Ultrasound Parameters  1.2w/cm2 100%     Ultrasound Goals  Pain               PT Short Term Goals - 01/10/17 1650      PT SHORT TERM GOAL #1   Title  independent with initial HEP    Status  Achieved        PT Long Term Goals - 01/31/17 1651      PT LONG TERM GOAL #1   Title  understand posture and body mechanics instruction    Status  Achieved  Plan - 02/13/17 1605    Clinical Impression Statement  pt repots that's he could feel it with horizontal abduction. Went over proper form when picking them up from floor and carrying box. Pt stated that seated rows and leg extensions felt good.     PT Treatment/Interventions  Cryotherapy;Moist Heat;Therapeutic activities;Therapeutic exercise;Manual techniques;Patient/family education;Dry needling    PT Next Visit Plan  dry needling, return to exercises or MD       Patient will benefit from skilled therapeutic intervention in order to improve the following deficits and impairments:  Decreased mobility, Decreased strength, Postural dysfunction, Improper body mechanics, Impaired flexibility, Pain, Increased muscle spasms, Decreased range of motion  Visit Diagnosis: Muscle spasm of back  Cervicalgia  Acute bilateral low back pain without sciatica     Problem List Patient Active Problem List   Diagnosis Date Noted  . Pacemaker at end of battery life 09/09/2014  . S/P cardiac pacemaker procedure, 09/08/14, St Jude medical, gen change 09/09/2014  . Complete heart block (HCC) 09/08/2014  . Adult attention deficit disorder 05/26/2014  . Cervical  pain 05/26/2014  . Back pain, chronic 05/26/2014  . AV block, High Grade, congenital 06/11/2013  . Pacemaker 05/27/2013  . Vomiting 04/05/2013  . Diarrhea 04/05/2013  . Nausea 04/05/2013  . Artificial cardiac pacemaker 03/21/2011  . 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 Delusional disorder(297.1) 03/15/2011    Class: Acute    Grayce Sessions, PTA 02/13/2017, 4:07 PM  [facility] [address] [place], [place], [address] Phone: [phone]   Fax:  [phone]  Name: Mario Wyatt MRN: [id number] Date of Birth: Nov 26, 1990

## 2017-02-20 ENCOUNTER — Ambulatory Visit: Payer: Medicaid Other | Admitting: Physical Therapy

## 2017-02-20 ENCOUNTER — Encounter: Payer: Self-pay | Admitting: Internal Medicine

## 2017-02-26 ENCOUNTER — Ambulatory Visit: Payer: Medicaid Other | Admitting: Physical Therapy

## 2017-02-26 ENCOUNTER — Encounter: Payer: Self-pay | Admitting: Physical Therapy

## 2017-02-26 DIAGNOSIS — M545 Low back pain, unspecified: Secondary | ICD-10-CM

## 2017-02-26 DIAGNOSIS — M62830 Muscle spasm of back: Secondary | ICD-10-CM | POA: Diagnosis not present

## 2017-02-26 DIAGNOSIS — M542 Cervicalgia: Secondary | ICD-10-CM

## 2017-02-26 NOTE — Therapy (Signed)
[facility] [address] [place], [place], [address] Phone: [phone]   Fax:  [phone]  Physical Therapy Treatment  Patient Details  Name: Mario Wyatt MRN: [id number] Date of Birth: 1990-01-03 Referring Provider: Junius Creamer   Encounter Date: 02/26/2017  PT End of Session - 02/26/17 1128    Visit Number  10    Date for PT Re-Evaluation  03/07/17    PT Start Time  0915    PT Stop Time  1005    PT Time Calculation (min)  50 min    Activity Tolerance  Patient tolerated treatment well    Behavior During Therapy  [facility] for tasks assessed/performed       Past Medical History:  Diagnosis Date  . Anxiety   . Asthma   . Attention deficit hyperactivity disorder (ADHD)   . Congenital complete AV block   . Pacemaker    Placed when pt was 27 years old.  . S/P cardiac pacemaker procedure, 09/08/14, St Jude medical 09/09/2014    Past Surgical History:  Procedure Laterality Date  . CARDIAC PACEMAKER PLACEMENT  2008   second degree heart block  . EP IMPLANTABLE DEVICE N/A 09/08/2014   Procedure: Pacemaker Implant;  Surgeon: Duke Salvia, MD;  Location: [facility] INVASIVE CV LAB;  Service: Cardiovascular;  Laterality: N/A;  . HERNIA REPAIR    . PACEMAKER INSERTION  1999    There were no vitals filed for this visit.  Subjective Assessment - 02/26/17 1122    Subjective  Patient reports that he is tired of hurting, reports that stress and work do cause some increased pain.  He c/o neck and low back pain.  Reports that he is feeling a little bit better overall but still hurting    Currently in Pain?  Yes    Pain Score  7     Pain Location  Neck    Pain Orientation  Right    Aggravating Factors   work and stress    Pain Relieving Factors  heat and stretches                      [facility] Adult PT Treatment/Exercise - 02/26/17 0001      Neck Exercises: Machines for Strengthening   UBE (Upper Arm Bike)  L3 44frd/3rev       Neck Exercises: Standing   Other Standing Exercises  Horizontab shoulder abd yellow  2x10      Lumbar Exercises: Aerobic   Nustep  L4 x 6 min       Lumbar Exercises: Machines for Strengthening   Other Lumbar Machine Exercise  Rows & lats 25lb 2x10       Ultrasound   Ultrasound Location  right upper trap area    Ultrasound Parameters  1.4w/cm2 at 100%    Ultrasound Goals  Pain      Manual Therapy   Manual Therapy  Passive ROM;Manual Traction;Soft tissue mobilization    Soft tissue mobilization  posterior cervical para spinals, right upper trap. levator and rhomboid area    Passive ROM  Cervial spine all directions, gentle over pressures and some contract relax    Manual Traction  occipital release               PT Short Term Goals - 01/10/17 1650      PT SHORT TERM GOAL #1   Title  independent with initial HEP    Status  Achieved        PT Long Term Goals - 02/26/17 1130      PT LONG TERM GOAL #2   Title  decrease pain 50%    Status  On-going      PT LONG TERM GOAL #3   Title  increase cervical ROM 25%    Status  Achieved            Plan - 02/26/17 1128    Clinical Impression Statement  Patient has a knot that he reports is tender and replicates his pain in the right upper trap.  He is tight but overall the cervical motion is WFL's today    PT Next Visit Plan  continue to assess, add exercises and posture body mechanics as toerated    Consulted and Agree with Plan of Care  Patient       Patient will benefit from skilled therapeutic intervention in order to improve the following deficits and impairments:  Decreased mobility, Decreased strength, Postural dysfunction, Improper body mechanics, Impaired flexibility, Pain, Increased muscle spasms, Decreased range of motion  Visit Diagnosis: Muscle spasm of back  Cervicalgia  Acute bilateral low back pain without sciatica     Problem List Patient Active Problem List   Diagnosis Date Noted   . Pacemaker at end of battery life 09/09/2014  . S/P cardiac pacemaker procedure, 09/08/14, St Jude medical, gen change 09/09/2014  . Complete heart block (HCC) 09/08/2014  . Adult attention deficit disorder 05/26/2014  . Cervical pain 05/26/2014  . Back pain, chronic 05/26/2014  . AV block, High Grade, congenital 06/11/2013  . Pacemaker 05/27/2013  . Vomiting 04/05/2013  . Diarrhea 04/05/2013  . Nausea 04/05/2013  . Artificial cardiac pacemaker 03/21/2011  . 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 Delusional disorder(297.1) 03/15/2011    Class: Acute    Jearld Lesch., PT 02/26/2017, 11:30 AM  [facility] [address] [place], [place], [address] Phone: [phone]   Fax:  [phone]  Name: Mario Wyatt MRN: [id number] Date of Birth: September 29, 1990

## 2017-02-28 ENCOUNTER — Ambulatory Visit (INDEPENDENT_AMBULATORY_CARE_PROVIDER_SITE_OTHER): Payer: Medicaid Other | Admitting: Internal Medicine

## 2017-02-28 ENCOUNTER — Encounter: Payer: Self-pay | Admitting: Internal Medicine

## 2017-02-28 VITALS — BP 110/80 | HR 78 | Ht 64.0 in | Wt 120.0 lb

## 2017-02-28 DIAGNOSIS — I429 Cardiomyopathy, unspecified: Secondary | ICD-10-CM | POA: Diagnosis not present

## 2017-02-28 DIAGNOSIS — Z950 Presence of cardiac pacemaker: Secondary | ICD-10-CM

## 2017-02-28 DIAGNOSIS — Q246 Congenital heart block: Secondary | ICD-10-CM | POA: Diagnosis not present

## 2017-02-28 NOTE — Patient Instructions (Addendum)
Medication Instructions:  Your physician recommends that you continue on your current medications as directed. Please refer to the Current Medication list given to you today.  Labwork: You may need to come in for labwork  Testing/Procedures: Bi-Ventricular ICD upgrade   Biventricular Pacemaker Implantation A biventricular pacemaker implantation is a procedure to place (implant) a pacemaker into both of the lower chambers (ventricles) of the heart. A pacemaker is a small, battery-powered device that helps control the heartbeat. If the heart beats irregularly or too slowly (bradycardia), the pacemaker will pace the heart so that it beats at a normal rate or a programmed rate. The parts of a biventricular pacemaker include:  The pulse generator. The pulse generator contains a small computer and a memory system that is programmed to keep the heart beating at a certain rate. The pulse generator also produces the electrical signal that triggers the heart to beat. This is implanted under the skin of the upper chest, near the collarbone.  Wires (leads). The leads are placed in the left and right ventricles of the heart. The leads are connected to the pulse generator. They transmit electrical pulses from the pulse generator to the heart.  This procedure may be done to treat:  Bradycardia.  Symptoms of severe heart failure, such as shortness of breath (dyspnea).  Loss of consciousness that happens repeatedly (syncope) because of an irregular heart rate.  Tell a health care provider about:  Any allergies you have.  All medicines you are taking, including vitamins, herbs, eye drops, creams, and over-the-counter medicines.  Any problems you or family members have had with anesthetic medicines.  Any blood disorders you have.  Any surgeries you have had.  Any medical conditions you have.  Whether you are pregnant or may be pregnant. What are the risks? Generally, this is a safe procedure.  However, problems may occur, including:  Infection.  Bleeding.  Allergic reactions to medicines or dyes.  Damage to other structures or organs, such as your blood vessels, lungs, or heart.  Failure of the pacemaker to improve your condition.  What happens before the procedure?  Ask your health care provider about: ? Changing or stopping your regular medicines. This is especially important if you are taking diabetes medicines or blood thinners. ? Taking medicines such as aspirin and ibuprofen. These medicines can thin your blood. Do not take these medicines before your procedure if your health care provider instructs you not to.  Follow instructions from your health care provider about eating or drinking restrictions.  Do not use any tobacco products for at least 24 hours before your procedure. This includes cigarettes, chewing tobacco, or e-cigarettes.  Ask your health care provider how your surgical site will be marked or identified.  You may be given antibiotic medicine to help prevent infection.  You may have tests, including: ? Blood tests. ? Chest X-rays.  Plan to have someone take you home after the procedure.  If you go home right after the procedure, plan to have someone with you for 24 hours. What happens during the procedure?  To reduce your risk of infection: ? Your health care team will wash or sanitize their hands. ? Your skin will be washed with soap. ? Hair may be removed from your surgical area.  An IV tube will be inserted into one of your veins.  You will be given one or more of the following: ? A medicine to help you relax (sedative). ? A medicine to make you fall  asleep (general anesthetic). ? A medicine that is injected into your spine to numb the area below and slightly above the injection site (spinal anesthetic). ? A medicine that is injected into an area of your body to numb everything below the injection site (regional anesthetic).  An  incision will be made in your upper chest, near your heart.  The leads will be guided into your incision, through your blood vessels, and into your ventricles. Your surgeon will use an X-ray machine (fluoroscope) to guide the leads into your heart.  The leads will be attached to your heart muscles and to the pulse generator.  The leads will be tested to make sure that they work correctly.  The pulse generator will be implanted under your skin, near your incision.  Your incision will be closed with stitches (sutures), skin glue, or adhesive tape.  A bandage (dressing) will be placed over your incision. The procedure may vary among health care providers and hospitals. What happens after the procedure?  Your blood pressure, heart rate, breathing rate, and blood oxygen level will be monitored often until the medicines you were given have worn off.  You may continue to receive fluids and medicines through an IV tube.  You will have some pain. Pain medicines will be available to help you.  You will have a chest X-ray done. This is to make sure that your pacemaker is in the right place.  You may have to wear compression stockings. These stockings help to prevent blood clots and reduce swelling in your legs.  You will be given a pacemaker identification card. This card lists the implant date, device model, and manufacturer of your pacemaker.  Do not drive for 24 hours if you received a sedative. This information is not intended to replace advice given to you by your health care provider. Make sure you discuss any questions you have with your health care provider. Document Released: 09/12/2011 Document Revised: 05/26/2015 Document Reviewed: 09/12/2014 Elsevier Interactive Patient Education  2018 Else[employer] Inc.   Cardioverter Defibrillator Implantation An implantable cardioverter defibrillator (ICD) is a small device that is placed under the skin in the chest or abdomen. An ICD consists of a  battery, a small computer (pulse generator), and wires (leads) that go into the heart. An ICD is used to detect and correct two types of dangerous irregular heartbeats (arrhythmias):  A rapid heart rhythm (tachycardia).  An arrhythmia in which the lower chambers of the heart (ventricles) contract in an uncoordinated way (fibrillation).  When an ICD detects tachycardia, it sends a low-energy shock to the heart to restore the heartbeat to normal (cardioversion). This signal is usually painless. If cardioversion does not work or if the ICD detects fibrillation, it delivers a high-energy shock to the heart (defibrillation) to restart the heart. This shock may feel like a strong jolt in the chest. Your health care provider may prescribe an ICD if:  You have had an arrhythmia that originated in the ventricles.  Your heart has been damaged by a disease or heart condition.  Sometimes, ICDs are programmed to act as a device called a pacemaker. Pacemakers can be used to treat a slow heartbeat (bradycardia) or tachycardia by taking over the heart rate with electrical impulses. Tell a health care provider about:  Any allergies you have.  All medicines you are taking, including vitamins, herbs, eye drops, creams, and over-the-counter medicines.  Any problems you or family members have had with anesthetic medicines.  Any blood disorders you  have.  Any surgeries you have had.  Any medical conditions you have.  Whether you are pregnant or may be pregnant. What are the risks? Generally, this is a safe procedure. However, problems may occur, including:  Swelling, bleeding, or bruising.  Infection.  Blood clots.  Damage to other structures or organs, such as nerves, blood vessels, or the heart.  Allergic reactions to medicines used during the procedure.  What happens before the procedure? Staying hydrated Follow instructions from your health care provider about hydration, which may  include:  Up to 2 hours before the procedure - you may continue to drink clear liquids, such as water, clear fruit juice, black coffee, and plain tea.  Eating and drinking restrictions Follow instructions from your health care provider about eating and drinking, which may include:  8 hours before the procedure - stop eating heavy meals or foods such as meat, fried foods, or fatty foods.  6 hours before the procedure - stop eating light meals or foods, such as toast or cereal.  6 hours before the procedure - stop drinking milk or drinks that contain milk.  2 hours before the procedure - stop drinking clear liquids.  Medicine Ask your health care provider about:  Changing or stopping your normal medicines. This is important if you take diabetes medicines or blood thinners.  Taking medicines such as aspirin and ibuprofen. These medicines can thin your blood. Do not take these medicines before your procedure if your doctor tells you not to.  Tests  You may have blood tests.  You may have a test to check the electrical signals in your heart (electrocardiogram, ECG).  You may have imaging tests, such as a chest X-ray. General instructions  For 24 hours before the procedure, stop using products that contain nicotine or tobacco, such as cigarettes and e-cigarettes. If you need help quitting, ask your health care provider.  Plan to have someone take you home from the hospital or clinic.  You may be asked to shower with a germ-killing soap. What happens during the procedure?  To reduce your risk of infection: ? Your health care team will wash or sanitize their hands. ? Your skin will be washed with soap. ? Hair may be removed from the surgical area.  Small monitors will be put on your body. They will be used to check your heart, blood pressure, and oxygen level.  An IV tube will be inserted into one of your veins.  You will be given one or more of the following: ? A medicine to  help you relax (sedative). ? A medicine to numb the area (local anesthetic). ? A medicine to make you fall asleep (general anesthetic).  Leads will be guided through a blood vessel into your heart and attached to your heart muscles. Depending on the ICD, the leads may go into one ventricle or they may go into both ventricles and into an upper chamber of the heart. An X-ray machine (fluoroscope) will be usedto help guide the leads.  A small incision will be made to create a deep pocket under your skin.  The pulse generator will be placed into the pocket.  The ICD will be tested.  The incision will be closed with stitches (sutures), skin glue, or staples.  A bandage (dressing) will be placed over the incision. This procedure may vary among health care providers and hospitals. What happens after the procedure?  Your blood pressure, heart rate, breathing rate, and blood oxygen level will be  monitored often until the medicines you were given have worn off.  A chest X-ray will be taken to check that the ICD is in the right place.  You will need to stay in the hospital for 1-2 days so your health care provider can make sure your ICD is working.  Do not drive for 24 hours if you received a sedative. Ask your health care provider when it is safe for you to drive.  You may be given an identification card explaining that you have an ICD. Summary  An implantable cardioverter defibrillator (ICD) is a small device that is placed under the skin in the chest or abdomen. It is used to detect and correct dangerous irregular heartbeats (arrhythmias).  An ICD consists of a battery, a small computer (pulse generator), and wires (leads) that go into the heart.  When an ICD detects rapid heart rhythm (tachycardia), it sends a low-energy shock to the heart to restore the heartbeat to normal (cardioversion). If cardioversion does not work or if the ICD detects uncoordinated heart contractions (fibrillation),  it delivers a high-energy shock to the heart (defibrillation) to restart the heart.  You will need to stay in the hospital for 1-2 days to make sure your ICD is working. This information is not intended to replace advice given to you by your health care provider. Make sure you discuss any questions you have with your health care provider. Document Released: 09/09/2001 Document Revised: 12/28/2015 Document Reviewed: 12/28/2015 Elsevier Interactive Patient Education  2017 Elsevier Inc.   Follow-Up: Your physician recommends that you schedule a follow-up appointment in:  One Year with Dr Graciela Husbands  Remote monitoring is used to monitor your Pacemaker from home. This monitoring reduces the number of office visits required to check your device to one time per year. It allows [place] to keep an eye on the functioning of your device to ensure it is working properly. You are scheduled for a device check from home on 04/04/2017. You may send your transmission at any time that day. If you have a wireless device, the transmission will be sent automatically. After your physician reviews your transmission, you will receive a postcard with your next transmission date.    Any Other Special Instructions Will Be Listed Below (If Applicable).     If you need a refill on your cardiac medications before your next appointment, please call your pharmacy.

## 2017-02-28 NOTE — Progress Notes (Signed)
Patient Care Team: Verlon Au, MD as PCP - General (Family Medicine)   HPI  Mario Wyatt is a 27 y.o. male Is seen to establish care for pacemaker implanted for high-grade heart block at the age of 27 years old. 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 He underwent generator replacement in 2008 receiving a St. Jude device. . He has had no syncope. He has no limitations exercise tolerance.  His initial implant was subpectoral.   His change out procedure done with WC was complicated by fracture of the atrial lead during revision and separation of the RV pin./Lead;  he required 2 new leads.  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  DATE TEST EF   7/18 Echo  35-40%   1/19 Echo   30-35 %         He complains of significant fatigue this is been ongoing for a year or more.  He denies shortness of breath or edema.  There is been some question in the past as to whether he has sleep apnea.  He is working and going to school.  He finds himself taking frequent naps.  Echocardiograms are as noted above and he has been started on carvedilol and lisinopril       Past Medical History:  Diagnosis Date  . Anxiety   . Asthma   . Attention deficit hyperactivity disorder (ADHD)   . Congenital complete AV block   . Pacemaker    Placed when pt was 27 years old.  . S/P cardiac pacemaker procedure, 09/08/14, St Jude medical 09/09/2014    Past Surgical History:  Procedure Laterality Date  . CARDIAC PACEMAKER PLACEMENT  2008   second degree heart block  . EP IMPLANTABLE DEVICE N/A 09/08/2014   Procedure: Pacemaker Implant;  Surgeon: Duke Salvia, MD;  Location: [facility] INVASIVE CV LAB;  Service: Cardiovascular;  Laterality: N/A;  . HERNIA REPAIR    . PACEMAKER INSERTION  1999    Current Outpatient Medications  Medication Sig Dispense Refill  . albuterol (PROVENTIL HFA;VENTOLIN HFA) 108 (90 BASE) MCG/ACT inhaler  Inhale 2 puffs into the lungs every 4 (four) hours as needed for wheezing or shortness of breath. 1 Inhaler 0  . carvedilol (COREG) 3.125 MG tablet Take 1 tablet (3.125 mg total) by mouth 2 (two) times daily. 60 tablet 6  . EPINEPHrine (EPIPEN 2-PAK) 0.3 mg/0.3 mL IJ SOAJ injection Inject 0.3 mLs (0.3 mg total) into the muscle once. 2 Device 0  . ibuprofen (ADVIL,MOTRIN) 600 MG tablet Take 1 tablet (600 mg total) by mouth every 8 (eight) hours as needed. 30 tablet 0  . lisinopril (PRINIVIL,ZESTRIL) 5 MG tablet Take 1 tablet (5 mg total) by mouth daily. 30 tablet 6  . Multiple Vitamin (MULTIVITAMIN WITH MINERALS) TABS tablet Take 1 tablet by mouth daily.    . traMADol (ULTRAM) 50 MG tablet Take 50 mg by mouth every 6 (six) hours as needed for moderate pain.      No current facility-administered medications for this visit.    Soc hx  He is working somoking some and drinking some   He has an identical twin brother  On October  Review of Systems negative except from HPI and PMH  Physical Exam BP 110/80   Pulse 78   Ht 5\' 4"  (1.626 m)   Wt 120 lb (54.4 kg)   BMI 20.60 kg/m  Well developed and nourished in no acute distress HENT  normal Neck supple with JVP-flat Clear Regular rate and rhythm, no murmurs or gallops Abd-soft with active BS No Clubbing cyanosis edema Skin-warm and dry A & Oriented  Grossly normal sensory and motor function   ECG demonstrates P synchronous AV pacing   Assessment and  Plan  High Grade Heart Block  Pacemaker  St Jude  Cardiomyopathy question mechanism  Fatigue  Patient has really limiting fatigue.  Blood work drawn about 9 months ago in the midst of all of this was normal.  His LV function is noted to be depressed.  Reviewing the records from care everywhere I do not see an earlier assessment of LV function at [facility].  He is on ace inhibitors and beta-blockers which is appropriate.  We have discussed CRT upgrade.  I am not sure whether he will be  patent inferolaterally.  I would anticipate a venogram.  If the vein were occluded, which is not so likely given the absence of collaterals on his chest wall, I would refer him for lead extraction so as to avoid going contralaterally in this very young man  More than 50% of 40 min was spent in counseling related to the above

## 2017-03-01 ENCOUNTER — Telehealth: Payer: Self-pay | Admitting: Internal Medicine

## 2017-03-01 DIAGNOSIS — Z01812 Encounter for preprocedural laboratory examination: Secondary | ICD-10-CM

## 2017-03-01 NOTE — Telephone Encounter (Signed)
New Message   Patient is calling wanting to speak with a nurse. States that he is not feeling well but not providing no more information then that. Please call

## 2017-03-01 NOTE — Telephone Encounter (Signed)
Returned pt's call. He would like to know how painful his procedure is going to be. I assured him he would have adequate pain control during the procedure and while recovering. He stated he felt better and thanked me for the call. He had no further questions.

## 2017-03-01 NOTE — Telephone Encounter (Signed)
Per pt request, scheduled pt for Bi V ICD upgrade for March 13 at 1200. Pt instructions are as follows:  NPO after midnight the night before procedure Take morning medicines as normal with a sip of water the morning of procedure Arrive at patient registration at 10am on 3/13 You will stay the night and need a ride home in the morning Use the surgical soap the evening before and morning of your procedure  I instructed pt that the surgical soap and procedure instruction letter will be left at the front desk. Pt agreed to have labs drawn pre procedure on March 7.  Pt verbalized understanding and will call with any additional questions.

## 2017-03-01 NOTE — Telephone Encounter (Signed)
New message    Patient calling to discuss surgery. Patient requesting soonest date available.

## 2017-03-04 LAB — CUP PACEART INCLINIC DEVICE CHECK
Battery Remaining Longevity: 118 mo
Implantable Lead Implant Date: 20160907
Implantable Lead Implant Date: 20160907
Implantable Lead Location: 753859
Implantable Pulse Generator Implant Date: 20160907
Lead Channel Impedance Value: 512.5 Ohm
Lead Channel Impedance Value: 712.5 Ohm
Lead Channel Pacing Threshold Amplitude: 0.75 V
Lead Channel Pacing Threshold Pulse Width: 0.4 ms
Lead Channel Pacing Threshold Pulse Width: 0.4 ms
Lead Channel Sensing Intrinsic Amplitude: 12 mV
Lead Channel Setting Pacing Amplitude: 1 V
Lead Channel Setting Pacing Amplitude: 2.125
Lead Channel Setting Pacing Pulse Width: 0.4 ms
Lead Channel Setting Sensing Sensitivity: 4 mV
MDC IDC LEAD LOCATION: 753860
MDC IDC MSMT BATTERY VOLTAGE: 2.99 V
MDC IDC MSMT LEADCHNL RA PACING THRESHOLD AMPLITUDE: 1.25 V
MDC IDC MSMT LEADCHNL RA PACING THRESHOLD AMPLITUDE: 1.25 V
MDC IDC MSMT LEADCHNL RA PACING THRESHOLD PULSEWIDTH: 0.4 ms
MDC IDC MSMT LEADCHNL RA SENSING INTR AMPL: 1.8 mV
MDC IDC SESS DTM: 20190228215603
MDC IDC STAT BRADY RA PERCENT PACED: 29 %
MDC IDC STAT BRADY RV PERCENT PACED: 99.7 %
Pulse Gen Serial Number: 7787423

## 2017-03-07 ENCOUNTER — Ambulatory Visit: Payer: Medicaid Other | Attending: Family Medicine | Admitting: Physical Therapy

## 2017-03-07 ENCOUNTER — Encounter: Payer: Self-pay | Admitting: Physical Therapy

## 2017-03-07 ENCOUNTER — Other Ambulatory Visit: Payer: Self-pay

## 2017-03-07 DIAGNOSIS — M542 Cervicalgia: Secondary | ICD-10-CM | POA: Insufficient documentation

## 2017-03-07 DIAGNOSIS — M545 Low back pain, unspecified: Secondary | ICD-10-CM

## 2017-03-07 DIAGNOSIS — M62830 Muscle spasm of back: Secondary | ICD-10-CM | POA: Diagnosis not present

## 2017-03-07 NOTE — Therapy (Signed)
[facility] [place] [facility] [address] [place], [place], [address] Phone: [phone]   Fax:  [phone]  Physical Therapy Treatment  Patient Details  Name: Mario Wyatt MRN: [id number] Date of Birth: 05-02-1990 Referring Provider: Junius Creamer   Encounter Date: 03/07/2017  PT End of Session - 03/07/17 1143    Visit Number  11    Date for PT Re-Evaluation  03/07/17    PT Start Time  1100    PT Stop Time  1145    PT Time Calculation (min)  45 min    Activity Tolerance  Patient tolerated treatment well;Patient limited by pain    Behavior During Therapy  [facility] for tasks assessed/performed       Past Medical History:  Diagnosis Date  . Anxiety   . Asthma   . Attention deficit hyperactivity disorder (ADHD)   . Congenital complete AV block   . Pacemaker    Placed when pt was 27 years old.  . S/P cardiac pacemaker procedure, 09/08/14, St Jude medical 09/09/2014    Past Surgical History:  Procedure Laterality Date  . CARDIAC PACEMAKER PLACEMENT  2008   second degree heart block  . EP IMPLANTABLE DEVICE N/A 09/08/2014   Procedure: Pacemaker Implant;  Surgeon: Duke Salvia, MD;  Location: [facility] INVASIVE CV LAB;  Service: Cardiovascular;  Laterality: N/A;  . HERNIA REPAIR    . PACEMAKER INSERTION  1999    There were no vitals filed for this visit.  Subjective Assessment - 03/07/17 1103    Subjective  Pt reports that he cardiologist wants him to have surgery Wednesday and will be out 5 weeks. Pt reports pain in R hip area neck    Currently in Pain?  Yes    Pain Score  7     Pain Location  -- hip and neck    Pain Orientation  Right                      [facility] Adult PT Treatment/Exercise - 03/07/17 0001      Neck Exercises: Machines for Strengthening   UBE (Upper Arm Bike)  L3 17frd/3rev      Neck Exercises: Standing   Other Standing Exercises  Horizontab shoulder abd yellow  2x10      Lumbar Exercises: Aerobic   UBE (Upper Arm Bike)  L3 72frd/3rev    Nustep  L4 x 6 min       Lumbar Exercises: Machines for Strengthening   Other Lumbar Machine Exercise  Rows & lats 25lb 2x10       Ultrasound   Ultrasound Location  rught upper trap    Ultrasound Parameters  1.4w/cm2 at 100%    Ultrasound Goals  Pain      Manual Therapy   Manual Therapy  Passive ROM;Manual Traction;Soft tissue mobilization    Soft tissue mobilization  posterior cervical para spinals, right upper trap. levator and rhomboid area    Passive ROM  Cervial spine all directions, gentle over pressures and some contract relax               PT Short Term Goals - 01/10/17 1650      PT SHORT TERM GOAL #1   Title  independent with initial HEP    Status  Achieved        PT Long Term Goals - 02/26/17 1130      PT LONG TERM GOAL #2   Title  decrease pain 50%    Status  On-going      PT LONG TERM GOAL #3   Title  increase cervical ROM 25%    Status  Achieved            Plan - 03/07/17 1151    Clinical Impression Statement  Pt continues to have a knot in R shoulder. Increase pain with seated rows reported. Cervical ROM good noted with PROM. Pt reports tightness and pain throughout the day in R shoulder and some in hip    Clinical Decision Making  High    Rehab Potential  Good    PT Frequency  2x / week    PT Duration  8 weeks    PT Treatment/Interventions  Cryotherapy;Moist Heat;Therapeutic activities;Therapeutic exercise;Manual techniques;Patient/family education;Dry needling    PT Next Visit Plan  continue to assess, add exercises and posture body mechanics as tolerated, informed pt to return to PCP       Patient will benefit from skilled therapeutic intervention in order to improve the following deficits and impairments:  Decreased mobility, Decreased strength, Postural dysfunction, Improper body mechanics, Impaired flexibility, Pain, Increased muscle spasms, Decreased range of motion  Visit Diagnosis: Muscle  spasm of back  Cervicalgia  Acute bilateral low back pain without sciatica     Problem List Patient Active Problem List   Diagnosis Date Noted  . Pacemaker at end of battery life 09/09/2014  . S/P cardiac pacemaker procedure, 09/08/14, St Jude medical, gen change 09/09/2014  . Complete heart block (HCC) 09/08/2014  . Adult attention deficit disorder 05/26/2014  . Cervical pain 05/26/2014  . Back pain, chronic 05/26/2014  . AV block, High Grade, congenital 06/11/2013  . Pacemaker 05/27/2013  . Vomiting 04/05/2013  . Diarrhea 04/05/2013  . Nausea 04/05/2013  . Artificial cardiac pacemaker 03/21/2011  . 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 [place], [place], [address] Phone: [phone]   Fax:  [phone]  Name: Mario Wyatt MRN: [id number] Date of Birth: 1990/10/01

## 2017-03-08 ENCOUNTER — Other Ambulatory Visit: Payer: Medicaid Other | Admitting: *Deleted

## 2017-03-08 DIAGNOSIS — Z01812 Encounter for preprocedural laboratory examination: Secondary | ICD-10-CM

## 2017-03-09 LAB — CBC WITH DIFFERENTIAL/PLATELET
Basophils Absolute: 0.1 10*3/uL (ref 0.0–0.2)
Basos: 1 %
EOS (ABSOLUTE): 0.4 10*3/uL (ref 0.0–0.4)
EOS: 6 %
HEMATOCRIT: 45.5 % (ref 37.5–51.0)
HEMOGLOBIN: 15.7 g/dL (ref 13.0–17.7)
IMMATURE GRANULOCYTES: 0 %
Immature Grans (Abs): 0 10*3/uL (ref 0.0–0.1)
Lymphocytes Absolute: 1.7 10*3/uL (ref 0.7–3.1)
Lymphs: 24 %
MCH: 30.6 pg (ref 26.6–33.0)
MCHC: 34.5 g/dL (ref 31.5–35.7)
MCV: 89 fL (ref 79–97)
MONOCYTES: 8 %
MONOS ABS: 0.6 10*3/uL (ref 0.1–0.9)
NEUTROS PCT: 61 %
Neutrophils Absolute: 4.4 10*3/uL (ref 1.4–7.0)
Platelets: 244 10*3/uL (ref 150–379)
RBC: 5.13 x10E6/uL (ref 4.14–5.80)
RDW: 12.8 % (ref 12.3–15.4)
WBC: 7.2 10*3/uL (ref 3.4–10.8)

## 2017-03-09 LAB — BASIC METABOLIC PANEL
BUN/Creatinine Ratio: 12 (ref 9–20)
BUN: 12 mg/dL (ref 6–20)
CHLORIDE: 101 mmol/L (ref 96–106)
CO2: 25 mmol/L (ref 20–29)
CREATININE: 0.98 mg/dL (ref 0.76–1.27)
Calcium: 9.4 mg/dL (ref 8.7–10.2)
GFR calc Af Amer: 122 mL/min/{1.73_m2} (ref >59)
GFR calc non Af Amer: 105 mL/min/{1.73_m2} (ref >59)
GLUCOSE: 74 mg/dL (ref 65–99)
Potassium: 4.1 mmol/L (ref 3.5–5.2)
Sodium: 143 mmol/L (ref 134–144)

## 2017-03-11 ENCOUNTER — Telehealth: Payer: Self-pay | Admitting: Internal Medicine

## 2017-03-13 ENCOUNTER — Telehealth: Payer: Self-pay | Admitting: Internal Medicine

## 2017-03-13 ENCOUNTER — Ambulatory Visit (HOSPITAL_COMMUNITY): Admission: RE | Admit: 2017-03-13 | Payer: Medicaid Other | Source: Ambulatory Visit | Admitting: Internal Medicine

## 2017-03-13 ENCOUNTER — Encounter (HOSPITAL_COMMUNITY): Admission: RE | Payer: Self-pay | Source: Ambulatory Visit

## 2017-03-13 SURGERY — BIV UPGRADE

## 2017-03-13 NOTE — Telephone Encounter (Signed)
Spoke with patient, informed patient he can drop off paperwork, and it would take a few days to have paperwork completed and a fee of 25 dollars. Patient verbalized understanding and thankful for the call

## 2017-03-13 NOTE — Telephone Encounter (Signed)
Clarified with pt on information on his FMLA. He asked if his work called, what would we tell them. I stated that by law, we cannot disclose any information to them. He verbalized understanding and had no additional questions.

## 2017-03-13 NOTE — Telephone Encounter (Signed)
New message  Pt wants to know if Graciela Husbands can fill out a FMLA form for him today. Please call

## 2017-03-13 NOTE — Telephone Encounter (Signed)
New Message     Patient wants to know what he is suppose to do about work, is he suppose to go or stay out ? He was suppose to have surgery this morning 03/13/17 and it was postponed. He has to know what to tell his boss.

## 2017-03-15 ENCOUNTER — Telehealth: Payer: Self-pay | Admitting: Internal Medicine

## 2017-03-15 NOTE — Telephone Encounter (Signed)
New message    Patient calling to see if FMLA paperwork is done and can he pick it up today

## 2017-03-15 NOTE — Telephone Encounter (Signed)
Called pt back. Made him aware I have the FMLA paperwork I am waiting on the payment. Pt stated he will bring payment on Monday when he comes into the office while seeing Dr.Taylor.    Made him aware it takes 10/14 days for process of fmla paperwork to be completed. Patient agreed.

## 2017-03-18 ENCOUNTER — Encounter: Payer: Self-pay | Admitting: Internal Medicine

## 2017-03-18 ENCOUNTER — Telehealth: Payer: Self-pay | Admitting: Internal Medicine

## 2017-03-18 ENCOUNTER — Ambulatory Visit (INDEPENDENT_AMBULATORY_CARE_PROVIDER_SITE_OTHER): Payer: Medicaid Other | Admitting: Internal Medicine

## 2017-03-18 VITALS — BP 118/78 | HR 69 | Ht 64.0 in | Wt 120.0 lb

## 2017-03-18 DIAGNOSIS — R5383 Other fatigue: Secondary | ICD-10-CM

## 2017-03-18 DIAGNOSIS — I429 Cardiomyopathy, unspecified: Secondary | ICD-10-CM

## 2017-03-18 DIAGNOSIS — Q246 Congenital heart block: Secondary | ICD-10-CM

## 2017-03-18 DIAGNOSIS — Z950 Presence of cardiac pacemaker: Secondary | ICD-10-CM

## 2017-03-18 NOTE — Patient Instructions (Addendum)
Medication Instructions:  Your physician recommends that you continue on your current medications as directed. Please refer to the Current Medication list given to you today.  Labwork: You will need lab work prior to your procedure.  This will be scheduled after we get your procedure scheduled.  Testing/Procedures: Dr. Ladona Ridgel is recommending removal of your current RA/RV lead and implantation of a new system.  Follow-Up:  March 28  April 1, 4, 8, 11, 15, 18, 22, 23, 24, and 30  I will be in touch with you as soon as possible.  Boneta Lucks RN [phone]  Any Other Special Instructions Will Be Listed Below (If Applicable).  If you need a refill on your cardiac medications before your next appointment, please call your pharmacy.

## 2017-03-18 NOTE — Progress Notes (Signed)
HPI Mr. Caster is referred today by Dr. Graciela Husbands for consideration of removal of broken PPM leads. He has a h/o congenital complete heart block and underwent PPM insertion initially 20 years ago with generator replacement twice, the last in 2016 when he was found to have broken leads. They were both capped and insertion of 2 new active fix PM leads were inserted. He has developed worsening CHF symptoms and was found to have worsening LV dysfunction with an EF of 35%. He has class 2 symptoms on maximal medical therapy. He has not had syncope.  Allergies  Allergen Reactions  . Iodine Anaphylaxis  . Shellfish Allergy Anaphylaxis  . Latex Hives and Rash     Current Outpatient Medications  Medication Sig Dispense Refill  . albuterol (PROVENTIL HFA;VENTOLIN HFA) 108 (90 BASE) MCG/ACT inhaler Inhale 2 puffs into the lungs every 4 (four) hours as needed for wheezing or shortness of breath. 1 Inhaler 0  . aspirin EC 81 MG tablet Take 81 mg by mouth 2 (two) times daily as needed for mild pain.    . carvedilol (COREG) 3.125 MG tablet Take 1 tablet (3.125 mg total) by mouth 2 (two) times daily. 60 tablet 6  . EPINEPHrine (EPIPEN 2-PAK) 0.3 mg/0.3 mL IJ SOAJ injection Inject 0.3 mLs (0.3 mg total) into the muscle once. 2 Device 0  . ibuprofen (ADVIL,MOTRIN) 200 MG tablet Take 200 mg by mouth every 6 (six) hours as needed for mild pain or moderate pain.    Marland Kitchen lisinopril (PRINIVIL,ZESTRIL) 5 MG tablet Take 1 tablet (5 mg total) by mouth daily. 30 tablet 6   No current facility-administered medications for this visit.      Past Medical History:  Diagnosis Date  . Anxiety   . Asthma   . Attention deficit hyperactivity disorder (ADHD)   . Congenital complete AV block   . Pacemaker    Placed when pt was 27 years old.  . S/P cardiac pacemaker procedure, 09/08/14, St Jude medical 09/09/2014    ROS:   All systems reviewed and negative except as noted in the HPI.   Past Surgical History:    Procedure Laterality Date  . CARDIAC PACEMAKER PLACEMENT  2008   second degree heart block  . EP IMPLANTABLE DEVICE N/A 09/08/2014   Procedure: Pacemaker Implant;  Surgeon: Duke Salvia, MD;  Location: [facility] INVASIVE CV LAB;  Service: Cardiovascular;  Laterality: N/A;  . HERNIA REPAIR    . PACEMAKER INSERTION  1999     Family History  Problem Relation Age of Onset  . Hypertension Mother   . Diabetes Mother   . Bipolar disorder Mother   . Drug abuse Mother   . Glaucoma Father   . Anesthesia problems Neg Hx   . Hypotension Neg Hx   . Malignant hyperthermia Neg Hx   . Pseudochol deficiency Neg Hx      Social History   Socioeconomic History  . Marital status: Single    Spouse name: Not on file  . Number of children: Not on file  . Years of education: Not on file  . Highest education level: Not on file  Social Needs  . Financial resource strain: Not on file  . Food insecurity - worry: Not on file  . Food insecurity - inability: Not on file  . Transportation needs - medical: Not on file  . Transportation needs - non-medical: Not on file  Occupational History  . Not on file  Tobacco Use  .  Smoking status: Former Smoker    Packs/day: 1.00    Types: Cigarettes    Last attempt to quit: 10/01/2013    Years since quitting: 3.4  . Smokeless tobacco: Never Used  Substance and Sexual Activity  . Alcohol use: Yes    Comment: socially  . Drug use: No  . Sexual activity: Not Currently    Birth control/protection: Abstinence  Other Topics Concern  . Not on file  Social History Narrative  . Not on file     BP 118/78   Pulse 69   Ht 5\' 4"  (1.626 m)   Wt 120 lb (54.4 kg)   BMI 20.60 kg/m   Physical Exam:  Well appearing young man, NAD HEENT: Unremarkable Neck:  6 cm JVD, no thyromegally Lymphatics:  No adenopathy Back:  No CVA tenderness Lungs:  Clear with no wheezes HEART:  Regular rate rhythm, no murmurs, no rubs, no clicks, split S2. Abd:  soft, positive bowel  sounds, no organomegally, no rebound, no guarding Ext:  2 plus pulses, no edema, no cyanosis, no clubbing Skin:  No rashes no nodules Neuro:  CN II through XII intact, motor grossly intact  EKG - NSR with pacing induced LBBB  DEVICE  Normal device function.  See PaceArt for details.   Assess/Plan: 1. Chronic systolic heart failure - his symptoms are class 2 and he has pacing induced LBBB. Upgrade to a biv PPM is indicated and there is a good chance of improvement of his LV function.  2. Broken PM leads - he has broken and capped 28 year old leads. His young age and need for upgrade to a Biv device makes removal of these non-functioning leads indicated. I have reviewed the risk which are not limited to bleeding, infection, cardiac perforation, PTX and possible death. He understands and wishes to proceed.  3. Congenital complete heart block - he is pacemaker dependent. I would anticipate placement of a temporary RV pacing lead at the time of his surgery. I spent a total of 45 minutes including 50% face to face time discussing the need for surgery. Leonia Reeves.D.

## 2017-03-18 NOTE — H&P (View-Only) (Signed)
HPI Mr. Caster is referred today by Dr. Graciela Husbands for consideration of removal of broken PPM leads. He has a h/o congenital complete heart block and underwent PPM insertion initially 20 years ago with generator replacement twice, the last in 2016 when he was found to have broken leads. They were both capped and insertion of 2 new active fix PM leads were inserted. He has developed worsening CHF symptoms and was found to have worsening LV dysfunction with an EF of 35%. He has class 2 symptoms on maximal medical therapy. He has not had syncope.  Allergies  Allergen Reactions  . Iodine Anaphylaxis  . Shellfish Allergy Anaphylaxis  . Latex Hives and Rash     Current Outpatient Medications  Medication Sig Dispense Refill  . albuterol (PROVENTIL HFA;VENTOLIN HFA) 108 (90 BASE) MCG/ACT inhaler Inhale 2 puffs into the lungs every 4 (four) hours as needed for wheezing or shortness of breath. 1 Inhaler 0  . aspirin EC 81 MG tablet Take 81 mg by mouth 2 (two) times daily as needed for mild pain.    . carvedilol (COREG) 3.125 MG tablet Take 1 tablet (3.125 mg total) by mouth 2 (two) times daily. 60 tablet 6  . EPINEPHrine (EPIPEN 2-PAK) 0.3 mg/0.3 mL IJ SOAJ injection Inject 0.3 mLs (0.3 mg total) into the muscle once. 2 Device 0  . ibuprofen (ADVIL,MOTRIN) 200 MG tablet Take 200 mg by mouth every 6 (six) hours as needed for mild pain or moderate pain.    Marland Kitchen lisinopril (PRINIVIL,ZESTRIL) 5 MG tablet Take 1 tablet (5 mg total) by mouth daily. 30 tablet 6   No current facility-administered medications for this visit.      Past Medical History:  Diagnosis Date  . Anxiety   . Asthma   . Attention deficit hyperactivity disorder (ADHD)   . Congenital complete AV block   . Pacemaker    Placed when pt was 27 years old.  . S/P cardiac pacemaker procedure, 09/08/14, St Jude medical 09/09/2014    ROS:   All systems reviewed and negative except as noted in the HPI.   Past Surgical History:    Procedure Laterality Date  . CARDIAC PACEMAKER PLACEMENT  2008   second degree heart block  . EP IMPLANTABLE DEVICE N/A 09/08/2014   Procedure: Pacemaker Implant;  Surgeon: Duke Salvia, MD;  Location: [facility] INVASIVE CV LAB;  Service: Cardiovascular;  Laterality: N/A;  . HERNIA REPAIR    . PACEMAKER INSERTION  1999     Family History  Problem Relation Age of Onset  . Hypertension Mother   . Diabetes Mother   . Bipolar disorder Mother   . Drug abuse Mother   . Glaucoma Father   . Anesthesia problems Neg Hx   . Hypotension Neg Hx   . Malignant hyperthermia Neg Hx   . Pseudochol deficiency Neg Hx      Social History   Socioeconomic History  . Marital status: Single    Spouse name: Not on file  . Number of children: Not on file  . Years of education: Not on file  . Highest education level: Not on file  Social Needs  . Financial resource strain: Not on file  . Food insecurity - worry: Not on file  . Food insecurity - inability: Not on file  . Transportation needs - medical: Not on file  . Transportation needs - non-medical: Not on file  Occupational History  . Not on file  Tobacco Use  .  Smoking status: Former Smoker    Packs/day: 1.00    Types: Cigarettes    Last attempt to quit: 10/01/2013    Years since quitting: 3.4  . Smokeless tobacco: Never Used  Substance and Sexual Activity  . Alcohol use: Yes    Comment: socially  . Drug use: No  . Sexual activity: Not Currently    Birth control/protection: Abstinence  Other Topics Concern  . Not on file  Social History Narrative  . Not on file     BP 118/78   Pulse 69   Ht 5\' 4"  (1.626 m)   Wt 120 lb (54.4 kg)   BMI 20.60 kg/m   Physical Exam:  Well appearing young man, NAD HEENT: Unremarkable Neck:  6 cm JVD, no thyromegally Lymphatics:  No adenopathy Back:  No CVA tenderness Lungs:  Clear with no wheezes HEART:  Regular rate rhythm, no murmurs, no rubs, no clicks, split S2. Abd:  soft, positive bowel  sounds, no organomegally, no rebound, no guarding Ext:  2 plus pulses, no edema, no cyanosis, no clubbing Skin:  No rashes no nodules Neuro:  CN II through XII intact, motor grossly intact  EKG - NSR with pacing induced LBBB  DEVICE  Normal device function.  See PaceArt for details.   Assess/Plan: 1. Chronic systolic heart failure - his symptoms are class 2 and he has pacing induced LBBB. Upgrade to a biv PPM is indicated and there is a good chance of improvement of his LV function.  2. Broken PM leads - he has broken and capped 28 year old leads. His young age and need for upgrade to a Biv device makes removal of these non-functioning leads indicated. I have reviewed the risk which are not limited to bleeding, infection, cardiac perforation, PTX and possible death. He understands and wishes to proceed.  3. Congenital complete heart block - he is pacemaker dependent. I would anticipate placement of a temporary RV pacing lead at the time of his surgery. I spent a total of 45 minutes including 50% face to face time discussing the need for surgery. Leonia Reeves.D.

## 2017-03-18 NOTE — Telephone Encounter (Signed)
FMLA given to Layla Maw nurse. I sent her a staff message to see if he would complete this for patient instead of it going through the copy service we use. Francee Nodal stated she will speak with Dr.Taylor about this and let me know. Patient is also aware.

## 2017-03-19 ENCOUNTER — Ambulatory Visit: Payer: Medicaid Other | Admitting: Physical Therapy

## 2017-03-19 ENCOUNTER — Encounter: Payer: Self-pay | Admitting: Physical Therapy

## 2017-03-19 DIAGNOSIS — M62830 Muscle spasm of back: Secondary | ICD-10-CM | POA: Diagnosis not present

## 2017-03-19 DIAGNOSIS — M545 Low back pain, unspecified: Secondary | ICD-10-CM

## 2017-03-19 DIAGNOSIS — M542 Cervicalgia: Secondary | ICD-10-CM

## 2017-03-19 NOTE — Therapy (Signed)
[facility] [place] [facility] [address] [place], [place], [address] Phone: [phone]   Fax:  [phone]  Physical Therapy Treatment  Patient Details  Name: Mario Wyatt MRN: [id number] Date of Birth: 1990/10/11 Referring Provider: Junius Creamer   Encounter Date: 03/19/2017  PT End of Session - 03/19/17 1342    Visit Number  12    Date for PT Re-Evaluation  03/07/17    PT Start Time  1300    PT Stop Time  1345    PT Time Calculation (min)  45 min    Activity Tolerance  Patient tolerated treatment well    Behavior During Therapy  [facility] for tasks assessed/performed       Past Medical History:  Diagnosis Date  . Anxiety   . Asthma   . Attention deficit hyperactivity disorder (ADHD)   . Congenital complete AV block   . Pacemaker    Placed when pt was 27 years old.  . S/P cardiac pacemaker procedure, 09/08/14, St Jude medical 09/09/2014    Past Surgical History:  Procedure Laterality Date  . CARDIAC PACEMAKER PLACEMENT  2008   second degree heart block  . EP IMPLANTABLE DEVICE N/A 09/08/2014   Procedure: Pacemaker Implant;  Surgeon: Duke Salvia, MD;  Location: [facility] INVASIVE CV LAB;  Service: Cardiovascular;  Laterality: N/A;  . HERNIA REPAIR    . PACEMAKER INSERTION  1999    There were no vitals filed for this visit.  Subjective Assessment - 03/19/17 1303    Subjective  Pt stated that his surgery has been postponed to next week because the MD want to do a more complex procedure. Pt reports that he has been taken out of work.     Currently in Pain?  Yes    Pain Score  6     Pain Location  -- neck and shoulders                      [facility] Adult PT Treatment/Exercise - 03/19/17 0001      Neck Exercises: Machines for Strengthening   UBE (Upper Arm Bike)  L3 49frd/3rev      Neck Exercises: Standing   Other Standing Exercises  Horizontab shoulder abd yellow  2x15    Other Standing Exercises  Shoulder ER yellow  2x10       Lumbar Exercises: Machines for Strengthening   Other Lumbar Machine Exercise  Rows & lats 25lb 2x10       Lumbar Exercises: Standing   Shoulder Extension  Theraband;Both;20 reps red tband       Ultrasound   Ultrasound Location  r upper trap     Ultrasound Parameters  1.4w/cm2 100%    Ultrasound Goals  Pain      Manual Therapy   Manual Therapy  Passive ROM;Manual Traction;Soft tissue mobilization    Soft tissue mobilization  posterior cervical para spinals, right upper trap. levator and rhomboid area    Passive ROM  Cervial spine all directions, gentle over pressures and some contract relax               PT Short Term Goals - 01/10/17 1650      PT SHORT TERM GOAL #1   Title  independent with initial HEP    Status  Achieved        PT Long Term Goals - 02/26/17 1130      PT LONG TERM GOAL #2  Title  decrease pain 50%    Status  On-going      PT LONG TERM GOAL #3   Title  increase cervical ROM 25%    Status  Achieved            Plan - 03/19/17 1343    Clinical Impression Statement  Trigger pin noted in upper R trap. Positive response to [place] and [place]. No issues with today's activities. Kept interventions the same, pt reports that his doctor didn't want him to do anything to strenuous. Repots heart surgery next week.    PT Frequency  2x / week    PT Duration  8 weeks    PT Treatment/Interventions  Cryotherapy;Moist Heat;Therapeutic activities;Therapeutic exercise;Manual techniques;Patient/family education;Dry needling    PT Next Visit Plan  continue to assess, add exercises and posture body mechanics as tolerated, informed pt to return to PCP       Patient will benefit from skilled therapeutic intervention in order to improve the following deficits and impairments:  Decreased mobility, Decreased strength, Postural dysfunction, Improper body mechanics, Impaired flexibility, Pain, Increased muscle spasms, Decreased range of motion  Visit  Diagnosis: Cervicalgia  Muscle spasm of back  Acute bilateral low back pain without sciatica     Problem List Patient Active Problem List   Diagnosis Date Noted  . Pacemaker at end of battery life 09/09/2014  . S/P cardiac pacemaker procedure, 09/08/14, St Jude medical, gen change 09/09/2014  . Complete heart block (HCC) 09/08/2014  . Adult attention deficit disorder 05/26/2014  . Cervical pain 05/26/2014  . Back pain, chronic 05/26/2014  . AV block, High Grade, congenital 06/11/2013  . Pacemaker 05/27/2013  . Vomiting 04/05/2013  . Diarrhea 04/05/2013  . Nausea 04/05/2013  . Artificial cardiac pacemaker 03/21/2011  . 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 [place], [place], [address] Phone: [phone]   Fax:  [phone]  Name: EDWARDO WOJNAROWSKI MRN: [id number] Date of Birth: [birth year]

## 2017-03-20 ENCOUNTER — Telehealth: Payer: Self-pay

## 2017-03-20 DIAGNOSIS — I429 Cardiomyopathy, unspecified: Secondary | ICD-10-CM

## 2017-03-20 NOTE — Telephone Encounter (Signed)
Attempted outreach to Pt.  Pt mail box was full. Need to notify Pt lead extraction procedure has been scheduled for 04/04/2017 @ 3:00 pm.  Pt should arrive at 1:00 pm.  Pt will need prelabs and instruction. Will call tomorrow 03/21/2017.

## 2017-03-21 NOTE — Telephone Encounter (Signed)
Letter at front desk for pickup.  No further action at this time.

## 2017-03-21 NOTE — Telephone Encounter (Signed)
Spoke with Pt.  Confirmed 04/04/2017 at 3:00 pm ok for procedure.  Pt will come to office March 26, 2017 to pick up instruction letter, surgical scrub and get labs.  Will create letter.

## 2017-03-25 ENCOUNTER — Encounter (HOSPITAL_COMMUNITY)
Admission: RE | Admit: 2017-03-25 | Discharge: 2017-03-25 | Disposition: A | Discharge status: Home / Self Care | Payer: Medicaid Other | Source: Ambulatory Visit | Attending: Internal Medicine | Admitting: Internal Medicine

## 2017-03-25 ENCOUNTER — Encounter (HOSPITAL_COMMUNITY): Payer: Self-pay

## 2017-03-25 ENCOUNTER — Other Ambulatory Visit: Payer: Self-pay

## 2017-03-25 DIAGNOSIS — Z01812 Encounter for preprocedural laboratory examination: Secondary | ICD-10-CM | POA: Diagnosis not present

## 2017-03-25 HISTORY — DX: Unspecified osteoarthritis, unspecified site: M19.90

## 2017-03-25 HISTORY — DX: Unspecified macular degeneration: H35.30

## 2017-03-25 HISTORY — DX: Presence of spectacles and contact lenses: Z97.3

## 2017-03-25 HISTORY — DX: Cardiomyopathy, unspecified: I42.9

## 2017-03-25 HISTORY — DX: Gastro-esophageal reflux disease without esophagitis: K21.9

## 2017-03-25 LAB — BASIC METABOLIC PANEL
Anion gap: 10 (ref 5–15)
BUN: 13 mg/dL (ref 6–20)
CO2: 26 mmol/L (ref 22–32)
Calcium: 9.4 mg/dL (ref 8.9–10.3)
Chloride: 103 mmol/L (ref 101–111)
Creatinine, Ser: 1.09 mg/dL (ref 0.61–1.24)
GFR calc Af Amer: >60 mL/min (ref >60)
GFR calc non Af Amer: >60 mL/min (ref >60)
Glucose, Bld: 91 mg/dL (ref 65–99)
Potassium: 4 mmol/L (ref 3.5–5.1)
Sodium: 139 mmol/L (ref 135–145)

## 2017-03-25 LAB — SURGICAL PCR SCREEN
MRSA, PCR: NEGATIVE
Staphylococcus aureus: NEGATIVE

## 2017-03-25 LAB — CBC
HCT: 45 % (ref 39.0–52.0)
Hemoglobin: 15.6 g/dL (ref 13.0–17.0)
MCH: 30.4 pg (ref 26.0–34.0)
MCHC: 34.7 g/dL (ref 30.0–36.0)
MCV: 87.7 fL (ref 78.0–100.0)
Platelets: 267 10*3/uL (ref 150–400)
RBC: 5.13 MIL/uL (ref 4.22–5.81)
RDW: 12.3 % (ref 11.5–15.5)
WBC: 4.8 10*3/uL (ref 4.0–10.5)

## 2017-03-25 LAB — ABO/RH: ABO/RH(D): O POS

## 2017-03-25 NOTE — Progress Notes (Signed)
Pt denies any acute cardiopulmonary issues. Pt under the care of Dr. Graciela Husbands, Cardiology. Peri-op Prescription for ICD initiated; awaiting completion to contact St. Jude Rep. Pt denies having a stress test and Cardiac cath. Pt denies recent labs. Pt chart forwarded to anesthesia for review.

## 2017-03-25 NOTE — Pre-Procedure Instructions (Signed)
Calvin Renberg Lambertson  03/25/2017      [facility] AT [facility], [place] - [address] [place] [address] Phone: [phone] Fax: [phone]  [facility] [address], [place] - [address] [facility] [address] [place] [address] Phone: [phone] Fax: [phone]    Your procedure is scheduled on Thursday, April 04, 2017  Report to [facility] Admitting at 1:00 P.M.  Call this number if you have problems the morning of surgery:  [phone]   Remember:  Do not eat food or drink liquids after midnight Wednesday, April 03, 2017  Take these medicines the morning of surgery with A SIP OF WATER: carvedilol (COREG),  If needed: albuterol (PROVENTIL HFA;VENTOLIN HFA)  Inhaler for  wheezing or shortness of breath ( Bring inhaler in with you on day of surgery) Stop taking vitamins, fish oil and herbal medications. Do not take any NSAIDs ie: Ibuprofen, Advil, Naproxen (Aleve), Motrin, BC and Goody Powder; stop Thursday, March 28, 2017.  Do not wear jewelry, make-up or nail polish.  Do not wear lotions, powders, or perfumes, or deodorant.  Do not shave 48 hours prior to surgery.  Men may shave face and neck.  Do not bring valuables to the hospital.  [facility] is not responsible for any belongings or valuables.  Contacts, dentures or bridgework may not be worn into surgery.  Leave your suitcase in the car.  After surgery it may be brought to your room. For patients admitted to the hospital, discharge time will be determined by your treatment team. Patients discharged the day of surgery will not be allowed to drive home.  Special instructions:  Special Instructions:Special Instructions: [place] - Preparing for Surgery  Before surgery, you can play an important role.  Because skin is not sterile, your skin needs to be as free of germs as possible.  You can reduce the number of germs on you skin by washing with  CHG (chlorahexidine gluconate) soap before surgery.  CHG is an antiseptic cleaner which kills germs and bonds with the skin to continue killing germs even after washing.  Please DO NOT use if you have an allergy to CHG or antibacterial soaps.  If your skin becomes reddened/irritated stop using the CHG and inform your nurse when you arrive at Short Stay.  Do not shave (including legs and underarms) for at least 48 hours prior to the first CHG shower.  You may shave your face.  Please follow these instructions carefully:   1.  Shower with CHG Soap the night before surgery and the morning of Surgery.  2.  If you choose to wash your hair, wash your hair first as usual with your normal shampoo.  3.  After you shampoo, rinse your hair and body thoroughly to remove the Shampoo.  4.  Use CHG as you would any other liquid soap.  You can apply chg directly  to the skin and wash gently with scrungie or a clean washcloth.  5.  Apply the CHG Soap to your body ONLY FROM THE NECK DOWN.  Do not use on open wounds or open sores.  Avoid contact with your eyes, ears, mouth and genitals (private parts).  Wash genitals (private parts) with your normal soap.  6.  Wash thoroughly, paying special attention to the area where your surgery will be performed.  7.  Thoroughly rinse your body with warm water from the neck down.  8.  DO NOT shower/wash with your normal soap after using and rinsing off the CHG Soap.  9.  Pat yourself dry with a clean towel.            10.  Wear clean pajamas.            11.  Place clean sheets on your bed the night of your first shower and do not sleep with pets.  Day of Surgery  Do not apply any lotions/deodorants the morning of surgery.  Please wear clean clothes to the hospital/surgery center.  Please read over the following fact sheets that you were given. Pain Booklet, Coughing and Deep Breathing, MRSA Information and Surgical Site Infection Prevention

## 2017-03-26 ENCOUNTER — Other Ambulatory Visit: Payer: Self-pay

## 2017-03-29 ENCOUNTER — Ambulatory Visit: Payer: Medicaid Other | Admitting: Physical Therapy

## 2017-03-29 ENCOUNTER — Encounter: Payer: Self-pay | Admitting: Physical Therapy

## 2017-03-29 DIAGNOSIS — M542 Cervicalgia: Secondary | ICD-10-CM

## 2017-03-29 DIAGNOSIS — M62830 Muscle spasm of back: Secondary | ICD-10-CM

## 2017-03-29 DIAGNOSIS — M545 Low back pain, unspecified: Secondary | ICD-10-CM

## 2017-03-29 NOTE — Therapy (Signed)
[facility] [place] [address] [place], [place], [address] Phone: [phone]   Fax:  [phone]  Physical Therapy Treatment  Patient Details  Name: Mario Wyatt MRN: [id number] Date of Birth: 1990/01/24 Referring Provider: Junius Creamer   Encounter Date: 03/29/2017  PT End of Session - 03/29/17 0846    Activity Tolerance  Patient tolerated treatment well    Behavior During Therapy  [facility] for tasks assessed/performed       Past Medical History:  Diagnosis Date   Anxiety    Arthritis    Asthma    Attention deficit hyperactivity disorder (ADHD)    Cardiomyopathy (HCC)    Congenital complete AV block    GERD (gastroesophageal reflux disease)    Headache    migraines   Macular degeneration    bilateral   Pacemaker    Placed when pt was 27 years old.   S/P cardiac pacemaker procedure, 09/08/14, St Jude medical 09/09/2014   Wears glasses     Past Surgical History:  Procedure Laterality Date   CARDIAC PACEMAKER PLACEMENT  2008   second degree heart block   EP IMPLANTABLE DEVICE N/A 09/08/2014   Procedure: Pacemaker Implant;  Surgeon: Duke Salvia, MD;  Location: [facility] INVASIVE CV LAB;  Service: Cardiovascular;  Laterality: N/A;   HERNIA REPAIR     PACEMAKER INSERTION  1999    There were no vitals filed for this visit.  Subjective Assessment - 03/29/17 0834    Subjective  Feeling similar pain on the Right side of his neck and upper trap.  Feels like he needs a "pop" at this area to provide relief.  Surgical procedure for pacemaker device scheduled for 04/04/17    Currently in Pain?  Yes    Pain Score  7     Pain Location  Neck    Pain Orientation  Right    Pain Descriptors / Indicators  Aching;Burning;Pressure                No data recorded       [facility] Adult PT Treatment/Exercise - 03/29/17 0001      Neck Exercises: Machines for Strengthening   UBE (Upper Arm Bike)  L3 7frd/3rev       Neck Exercises: Standing   Other Standing Exercises  Horizontab shoulder abd yellow  2x15    Other Standing Exercises  Shoulder ER yellow 2x10       Neck Exercises: Seated   Other Seated Exercise  nags and snags Right C/S rotation 12x      Ultrasound   Ultrasound Location  Right upper trap    Ultrasound Parameters  1.4w/cm 2100% 1.64mHz    Ultrasound Goals  Pain      Manual Therapy   Manual Therapy  Passive ROM;Manual Traction;Soft tissue mobilization    Soft tissue mobilization  posterior cervical para spinals, right upper trap. levator and rhomboid area    Passive ROM  Cervial spine all directions, gentle over pressures and some contract relax    Manual Traction  occipital release               PT Short Term Goals - 01/10/17 1650      PT SHORT TERM GOAL #1   Title  independent with initial HEP    Status  Achieved        PT Long Term Goals - 02/26/17 1130      PT LONG TERM GOAL #2  Title  decrease pain 50%    Status  On-going      PT LONG TERM GOAL #3   Title  increase cervical ROM 25%    Status  Achieved            Plan - 03/29/17 0846    Clinical Impression Statement  Trigger points throughout Right upper trap, levator scap, and Righ tC/S paraspinals.  relief wih nags and snags for Right C/S rotation       Patient will benefit from skilled therapeutic intervention in order to improve the following deficits and impairments:     Visit Diagnosis: Cervicalgia  Muscle spasm of back  Acute bilateral low back pain without sciatica     Problem List Patient Active Problem List   Diagnosis Date Noted   Pacemaker at end of battery life 09/09/2014   S/P cardiac pacemaker procedure, 09/08/14, St Jude medical, gen change 09/09/2014   Complete heart block (HCC) 09/08/2014   Adult attention deficit disorder 05/26/2014   Cervical pain 05/26/2014   Back pain, chronic 05/26/2014   AV block, High Grade, congenital 06/11/2013   Pacemaker  05/27/2013   Vomiting 04/05/2013   Diarrhea 04/05/2013   Nausea 04/05/2013   Artificial cardiac pacemaker 03/21/2011   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 [place], [place], [address] Phone: [phone]   Fax:  [phone]  Name: Mario Wyatt MRN: [id number] Date of Birth: 1990/05/27

## 2017-03-29 NOTE — Addendum Note (Signed)
Addended by: Elmarie Shiley on: 03/29/2017 08:56 AM   Modules accepted: Orders

## 2017-04-02 ENCOUNTER — Telehealth: Payer: Self-pay | Admitting: Physical Therapy

## 2017-04-02 ENCOUNTER — Ambulatory Visit: Payer: Medicaid Other | Admitting: Physical Therapy

## 2017-04-03 MED ORDER — SODIUM CHLORIDE 0.9 % IR SOLN
80.0000 mg | Status: AC
  Administered 2017-04-04: 80 mg
  Filled 2017-04-03 (×2): qty 2

## 2017-04-03 MED ORDER — CEFAZOLIN SODIUM-DEXTROSE 2-4 GM/100ML-% IV SOLN
2.0000 g | INTRAVENOUS | Status: AC
  Administered 2017-04-04: 2 g via INTRAVENOUS
  Filled 2017-04-03: qty 100

## 2017-04-04 ENCOUNTER — Inpatient Hospital Stay (HOSPITAL_COMMUNITY): Payer: Medicaid Other | Admitting: Emergency Medicine

## 2017-04-04 ENCOUNTER — Inpatient Hospital Stay (HOSPITAL_COMMUNITY): Payer: Medicaid Other

## 2017-04-04 ENCOUNTER — Encounter: Payer: Medicaid Other | Admitting: *Deleted

## 2017-04-04 ENCOUNTER — Telehealth: Payer: Self-pay | Admitting: Cardiology

## 2017-04-04 ENCOUNTER — Ambulatory Visit (HOSPITAL_COMMUNITY)
Admission: RE | Admit: 2017-04-04 | Discharge: 2017-04-05 | Disposition: A | Discharge status: Home / Self Care | Payer: Medicaid Other | Source: Ambulatory Visit | Attending: Internal Medicine | Admitting: Internal Medicine

## 2017-04-04 ENCOUNTER — Encounter (HOSPITAL_COMMUNITY)
Admission: RE | Disposition: A | Discharge status: Home / Self Care | Payer: Self-pay | Source: Ambulatory Visit | Attending: Internal Medicine

## 2017-04-04 ENCOUNTER — Encounter (HOSPITAL_COMMUNITY): Payer: Self-pay | Admitting: Urology

## 2017-04-04 ENCOUNTER — Inpatient Hospital Stay (HOSPITAL_COMMUNITY): Payer: Medicaid Other | Admitting: Certified Registered Nurse Anesthetist

## 2017-04-04 DIAGNOSIS — F988 Other specified behavioral and emotional disorders with onset usually occurring in childhood and adolescence: Secondary | ICD-10-CM | POA: Diagnosis not present

## 2017-04-04 DIAGNOSIS — I5022 Chronic systolic (congestive) heart failure: Secondary | ICD-10-CM | POA: Insufficient documentation

## 2017-04-04 DIAGNOSIS — Z888 Allergy status to other drugs, medicaments and biological substances status: Secondary | ICD-10-CM | POA: Insufficient documentation

## 2017-04-04 DIAGNOSIS — T82190A Other mechanical complication of cardiac electrode, initial encounter: Secondary | ICD-10-CM | POA: Diagnosis not present

## 2017-04-04 DIAGNOSIS — Z79899 Other long term (current) drug therapy: Secondary | ICD-10-CM | POA: Insufficient documentation

## 2017-04-04 DIAGNOSIS — J45909 Unspecified asthma, uncomplicated: Secondary | ICD-10-CM | POA: Insufficient documentation

## 2017-04-04 DIAGNOSIS — Q246 Congenital heart block: Secondary | ICD-10-CM | POA: Insufficient documentation

## 2017-04-04 DIAGNOSIS — Z7982 Long term (current) use of aspirin: Secondary | ICD-10-CM | POA: Diagnosis not present

## 2017-04-04 DIAGNOSIS — Y838 Other surgical procedures as the cause of abnormal reaction of the patient, or of later complication, without mention of misadventure at the time of the procedure: Secondary | ICD-10-CM | POA: Insufficient documentation

## 2017-04-04 DIAGNOSIS — Z8249 Family history of ischemic heart disease and other diseases of the circulatory system: Secondary | ICD-10-CM | POA: Diagnosis not present

## 2017-04-04 DIAGNOSIS — I420 Dilated cardiomyopathy: Secondary | ICD-10-CM | POA: Diagnosis not present

## 2017-04-04 DIAGNOSIS — Z87891 Personal history of nicotine dependence: Secondary | ICD-10-CM | POA: Diagnosis not present

## 2017-04-04 DIAGNOSIS — Z950 Presence of cardiac pacemaker: Secondary | ICD-10-CM | POA: Diagnosis present

## 2017-04-04 DIAGNOSIS — I447 Left bundle-branch block, unspecified: Secondary | ICD-10-CM | POA: Insufficient documentation

## 2017-04-04 DIAGNOSIS — Z91040 Latex allergy status: Secondary | ICD-10-CM | POA: Insufficient documentation

## 2017-04-04 DIAGNOSIS — F909 Attention-deficit hyperactivity disorder, unspecified type: Secondary | ICD-10-CM | POA: Diagnosis present

## 2017-04-04 DIAGNOSIS — Z91013 Allergy to seafood: Secondary | ICD-10-CM | POA: Insufficient documentation

## 2017-04-04 HISTORY — DX: Dorsalgia, unspecified: M54.9

## 2017-04-04 HISTORY — PX: PERMANENT PACEMAKER GENERATOR CHANGE: SHX6022

## 2017-04-04 HISTORY — DX: Migraine, unspecified, not intractable, without status migrainosus: G43.909

## 2017-04-04 HISTORY — DX: Headache, unspecified: R51.9

## 2017-04-04 HISTORY — DX: Headache: R51

## 2017-04-04 HISTORY — PX: PACEMAKER LEAD REMOVAL: SHX5064

## 2017-04-04 HISTORY — DX: Other chronic pain: G89.29

## 2017-04-04 LAB — PREPARE RBC (CROSSMATCH)

## 2017-04-04 SURGERY — REMOVAL, ELECTRODE LEAD, CARDIAC PACEMAKER, WITHOUT REPLACEMENT
Anesthesia: General | Site: Chest

## 2017-04-04 MED ORDER — SODIUM CHLORIDE 0.9 % IV SOLN: Freq: Once | INTRAVENOUS | Status: DC

## 2017-04-04 MED ORDER — PROPOFOL 10 MG/ML IV BOLUS
INTRAVENOUS | Status: AC
  Filled 2017-04-04: qty 20

## 2017-04-04 MED ORDER — SUGAMMADEX SODIUM 200 MG/2ML IV SOLN
INTRAVENOUS | Status: DC | PRN
  Administered 2017-04-04: 100 mg via INTRAVENOUS

## 2017-04-04 MED ORDER — ONDANSETRON HCL 4 MG/2ML IJ SOLN: 4.0000 mg | Freq: Four times a day (QID) | INTRAMUSCULAR | Status: DC | PRN

## 2017-04-04 MED ORDER — ACETAMINOPHEN 325 MG PO TABS
325.0000 mg | ORAL_TABLET | ORAL | Status: DC | PRN
  Filled 2017-04-04: qty 2

## 2017-04-04 MED ORDER — ONDANSETRON HCL 4 MG/2ML IJ SOLN
INTRAMUSCULAR | Status: AC
  Filled 2017-04-04: qty 2

## 2017-04-04 MED ORDER — LIDOCAINE HCL (PF) 1 % IJ SOLN
INTRAMUSCULAR | Status: AC
  Filled 2017-04-04: qty 30

## 2017-04-04 MED ORDER — ONDANSETRON HCL 4 MG/2ML IJ SOLN
INTRAMUSCULAR | Status: DC | PRN
  Administered 2017-04-04: 4 mg via INTRAVENOUS

## 2017-04-04 MED ORDER — YOU HAVE A PACEMAKER BOOK
Freq: Once | Status: AC
  Administered 2017-04-04: 1
  Filled 2017-04-04: qty 1

## 2017-04-04 MED ORDER — TRAMADOL HCL 50 MG PO TABS
100.0000 mg | ORAL_TABLET | Freq: Four times a day (QID) | ORAL | Status: DC | PRN
  Administered 2017-04-04 – 2017-04-05 (×2): 100 mg via ORAL
  Filled 2017-04-04 (×2): qty 2

## 2017-04-04 MED ORDER — DEXAMETHASONE SODIUM PHOSPHATE 10 MG/ML IJ SOLN
INTRAMUSCULAR | Status: AC
  Filled 2017-04-04: qty 1

## 2017-04-04 MED ORDER — LACTATED RINGERS IV SOLN
INTRAVENOUS | Status: DC | PRN
  Administered 2017-04-04: 14:00:00 via INTRAVENOUS

## 2017-04-04 MED ORDER — PHENYLEPHRINE 40 MCG/ML (10ML) SYRINGE FOR IV PUSH (FOR BLOOD PRESSURE SUPPORT)
PREFILLED_SYRINGE | INTRAVENOUS | Status: AC
  Filled 2017-04-04: qty 10

## 2017-04-04 MED ORDER — SUCCINYLCHOLINE CHLORIDE 200 MG/10ML IV SOSY
PREFILLED_SYRINGE | INTRAVENOUS | Status: AC
  Filled 2017-04-04: qty 10

## 2017-04-04 MED ORDER — SODIUM CHLORIDE 0.9 % IV SOLN
INTRAVENOUS | Status: AC
  Filled 2017-04-04: qty 2

## 2017-04-04 MED ORDER — SODIUM CHLORIDE 0.9 % IV SOLN
INTRAVENOUS | Status: AC
  Filled 2017-04-04: qty 1.2

## 2017-04-04 MED ORDER — MIDAZOLAM HCL 2 MG/2ML IJ SOLN
INTRAMUSCULAR | Status: AC
  Filled 2017-04-04: qty 2

## 2017-04-04 MED ORDER — SODIUM CHLORIDE 0.9 % IV SOLN
INTRAVENOUS | Status: DC
  Administered 2017-04-04: 13:00:00 via INTRAVENOUS

## 2017-04-04 MED ORDER — EPHEDRINE 5 MG/ML INJ
INTRAVENOUS | Status: AC
  Filled 2017-04-04: qty 10

## 2017-04-04 MED ORDER — ROCURONIUM BROMIDE 100 MG/10ML IV SOLN
INTRAVENOUS | Status: DC | PRN
  Administered 2017-04-04: 50 mg via INTRAVENOUS
  Administered 2017-04-04 (×2): 20 mg via INTRAVENOUS

## 2017-04-04 MED ORDER — FAMOTIDINE IN NACL 20-0.9 MG/50ML-% IV SOLN
20.0000 mg | INTRAVENOUS | Status: AC
  Administered 2017-04-04: 20 mg via INTRAVENOUS
  Filled 2017-04-04: qty 50

## 2017-04-04 MED ORDER — IOPAMIDOL (ISOVUE-370) INJECTION 76%
INTRAVENOUS | Status: AC
  Filled 2017-04-04: qty 50

## 2017-04-04 MED ORDER — FENTANYL CITRATE (PF) 100 MCG/2ML IJ SOLN
INTRAMUSCULAR | Status: DC | PRN
  Administered 2017-04-04: 100 ug via INTRAVENOUS
  Administered 2017-04-04 (×2): 50 ug via INTRAVENOUS

## 2017-04-04 MED ORDER — MIDAZOLAM HCL 5 MG/5ML IJ SOLN
INTRAMUSCULAR | Status: DC | PRN
  Administered 2017-04-04: 2 mg via INTRAVENOUS

## 2017-04-04 MED ORDER — OXYCODONE HCL 5 MG/5ML PO SOLN: 5.0000 mg | Freq: Once | ORAL | Status: DC | PRN

## 2017-04-04 MED ORDER — GENTAMICIN IN SALINE 1.6-0.9 MG/ML-% IV SOLN
INTRAVENOUS | Status: AC
  Filled 2017-04-04: qty 50

## 2017-04-04 MED ORDER — SODIUM CHLORIDE 0.9 % IV SOLN
INTRAVENOUS | Status: DC | PRN
  Administered 2017-04-04: 500 mL

## 2017-04-04 MED ORDER — CEFAZOLIN SODIUM-DEXTROSE 2-4 GM/100ML-% IV SOLN
INTRAVENOUS | Status: AC
  Filled 2017-04-04: qty 100

## 2017-04-04 MED ORDER — LIDOCAINE HCL (CARDIAC) 20 MG/ML IV SOLN
INTRAVENOUS | Status: DC | PRN
  Administered 2017-04-04: 60 mg via INTRAVENOUS

## 2017-04-04 MED ORDER — FENTANYL CITRATE (PF) 250 MCG/5ML IJ SOLN
INTRAMUSCULAR | Status: AC
  Filled 2017-04-04: qty 5

## 2017-04-04 MED ORDER — CEFAZOLIN SODIUM-DEXTROSE 1-4 GM/50ML-% IV SOLN
1.0000 g | Freq: Four times a day (QID) | INTRAVENOUS | Status: AC
Start: 2017-04-04 — End: 2017-04-05
  Administered 2017-04-04 – 2017-04-05 (×3): 1 g via INTRAVENOUS
  Filled 2017-04-04 (×3): qty 50

## 2017-04-04 MED ORDER — DIPHENHYDRAMINE HCL 50 MG/ML IJ SOLN
INTRAMUSCULAR | Status: DC | PRN
  Administered 2017-04-04: 25 mg via INTRAVENOUS

## 2017-04-04 MED ORDER — OXYCODONE HCL 5 MG PO TABS: 5.0000 mg | ORAL_TABLET | Freq: Once | ORAL | Status: DC | PRN

## 2017-04-04 MED ORDER — METHYLPREDNISOLONE SODIUM SUCC 125 MG IJ SOLR
125.0000 mg | INTRAMUSCULAR | Status: AC
  Administered 2017-04-04: 125 mg via INTRAVENOUS
  Filled 2017-04-04: qty 2

## 2017-04-04 MED ORDER — ROCURONIUM BROMIDE 10 MG/ML (PF) SYRINGE
PREFILLED_SYRINGE | INTRAVENOUS | Status: AC
  Filled 2017-04-04: qty 5

## 2017-04-04 MED ORDER — FENTANYL CITRATE (PF) 100 MCG/2ML IJ SOLN: 25.0000 ug | INTRAMUSCULAR | Status: DC | PRN

## 2017-04-04 MED ORDER — SUGAMMADEX SODIUM 200 MG/2ML IV SOLN
INTRAVENOUS | Status: AC
  Filled 2017-04-04: qty 2

## 2017-04-04 MED ORDER — LIDOCAINE HCL (PF) 1 % IJ SOLN
INTRAMUSCULAR | Status: DC | PRN
  Administered 2017-04-04: 15 mL

## 2017-04-04 MED ORDER — PROPOFOL 10 MG/ML IV BOLUS
INTRAVENOUS | Status: DC | PRN
  Administered 2017-04-04: 140 mg via INTRAVENOUS

## 2017-04-04 MED ORDER — IOPAMIDOL (ISOVUE-370) INJECTION 76%
INTRAVENOUS | Status: DC | PRN
  Administered 2017-04-04: 10 mL via INTRAVENOUS

## 2017-04-04 SURGICAL SUPPLY — 58 items
ALLURE CRT PM3262 (Pacemaker) ×3 IMPLANT
BAG BANDED W/RUBBER/TAPE 36X54 (MISCELLANEOUS) ×3 IMPLANT
BENZOIN TINCTURE PRP APPL 2/3 (GAUZE/BANDAGES/DRESSINGS) ×3 IMPLANT
BLADE 10 SAFETY STRL DISP (BLADE) ×3 IMPLANT
BLADE OSCILLATING /SAGITTAL (BLADE) IMPLANT
BLADE STERNUM SYSTEM 6 (BLADE) ×3 IMPLANT
BLADE SURG 10 STRL SS (BLADE) ×3 IMPLANT
BNDG COHESIVE 4X5 WHT NS (GAUZE/BANDAGES/DRESSINGS) IMPLANT
CANISTER SUCT 3000ML PPV (MISCELLANEOUS) ×3 IMPLANT
CLOSURE WOUND 1/2 X4 (GAUZE/BANDAGES/DRESSINGS) ×1
COVER BACK TABLE 60X90IN (DRAPES) ×3 IMPLANT
COVER DOME SNAP 22 D (MISCELLANEOUS) ×3 IMPLANT
DEVICE LCKNG LEAD CARDIAC (CATHETERS) ×2 IMPLANT
DRAPE CARDIOVASCULAR INCISE (DRAPES) ×2
DRAPE SRG 135X102X78XABS (DRAPES) ×1 IMPLANT
DRSG OPSITE 6X11 MED (GAUZE/BANDAGES/DRESSINGS) IMPLANT
DRSG TEGADERM 4X4.75 (GAUZE/BANDAGES/DRESSINGS) ×6 IMPLANT
ELECT REM PT RETURN 9FT ADLT (ELECTROSURGICAL) ×3
ELECTRODE REM PT RTRN 9FT ADLT (ELECTROSURGICAL) ×1 IMPLANT
GAUZE SPONGE 4X4 12PLY STRL (GAUZE/BANDAGES/DRESSINGS) ×6 IMPLANT
GAUZE SPONGE 4X4 16PLY XRAY LF (GAUZE/BANDAGES/DRESSINGS) ×6 IMPLANT
GLOVE BIOGEL PI IND STRL 7.5 (GLOVE) ×2 IMPLANT
GLOVE BIOGEL PI INDICATOR 7.5 (GLOVE) ×4
GLOVE ECLIPSE 8.0 STRL XLNG CF (GLOVE) ×6 IMPLANT
GOWN STRL REUS W/ TWL LRG LVL3 (GOWN DISPOSABLE) ×2 IMPLANT
GOWN STRL REUS W/ TWL XL LVL3 (GOWN DISPOSABLE) ×2 IMPLANT
GOWN STRL REUS W/TWL LRG LVL3 (GOWN DISPOSABLE) ×4
GOWN STRL REUS W/TWL XL LVL3 (GOWN DISPOSABLE) ×4
INTRODUCER LEAD VEIN TELESCOPE (MISCELLANEOUS) ×3 IMPLANT
KIT STYLET LEAD 0.014 DIA 45 (KITS) ×6 IMPLANT
KIT TURNOVER KIT B (KITS) ×3 IMPLANT
LEAD QUARTET 1458Q-86CM (Lead) ×3 IMPLANT
NEEDLE PERC 18GX7CM (NEEDLE) ×3 IMPLANT
PACEMAKER ALLURE CRT (Pacemaker) ×1 IMPLANT
PAD ARMBOARD 7.5X6 YLW CONV (MISCELLANEOUS) ×6 IMPLANT
PAD ELECT DEFIB RADIOL ZOLL (MISCELLANEOUS) ×3 IMPLANT
REMOVAL LLD CARDIAC LEAD EZ (CATHETERS) ×6
SET WORK STATION RETRIEVAL (MISCELLANEOUS) ×3 IMPLANT
SHEATH CRV INNER FEMORAL 12FR (SHEATH) ×3 IMPLANT
SHEATH PINNACLE 6F 10CM (SHEATH) ×3 IMPLANT
SHEATH TIGHTRAIL MECH (SHEATH) ×1
SHEATH TIGHTRAIL MECH 11F (SHEATH) ×2 IMPLANT
SHEATH TIGHTRAIL MINI 11F (SHEATH) ×3 IMPLANT
SNARE NEEDLE EYE 20MM W/SHTH (CATHETERS) ×3 IMPLANT
STRIP CLOSURE SKIN 1/2X4 (GAUZE/BANDAGES/DRESSINGS) ×2 IMPLANT
SUT PROLENE 2 0 SH DA (SUTURE) IMPLANT
SUT SILK 2 0 SH (SUTURE) ×12 IMPLANT
SUT VIC AB 3-0 X1 27 (SUTURE) ×6 IMPLANT
TOWEL GREEN STERILE FF (TOWEL DISPOSABLE) ×3 IMPLANT
TOWEL OR 17X24 6PK STRL BLUE (TOWEL DISPOSABLE) ×3 IMPLANT
TOWEL OR 17X26 10 PK STRL BLUE (TOWEL DISPOSABLE) ×3 IMPLANT
TRAY FOLEY CATH SILVER 16FR LF (SET/KITS/TRAYS/PACK) ×3 IMPLANT
TRAY FOLEY SILVER 16FR TEMP (SET/KITS/TRAYS/PACK) IMPLANT
TUBE CONNECTING 12'X1/4 (SUCTIONS)
TUBE CONNECTING 12X1/4 (SUCTIONS) IMPLANT
TUBING EXTENTION W/L.L. (IV SETS) ×3 IMPLANT
WRENCH HEX 7.6 (SPINAL CORD STIMULATOR) ×3 IMPLANT
YANKAUER SUCT BULB TIP NO VENT (SUCTIONS) IMPLANT

## 2017-04-04 NOTE — Anesthesia Preprocedure Evaluation (Signed)
Anesthesia Evaluation  Patient identified by MRN, date of birth, ID band Patient awake    Reviewed: Allergy & Precautions, H&P , NPO status , Patient's Chart, lab work & pertinent test results  Airway Mallampati: II   Neck ROM: full    Dental   Pulmonary asthma , former smoker,    breath sounds clear to auscultation       Cardiovascular + dysrhythmias + pacemaker  Rhythm:regular Rate:Normal  H/o complete heart block   Neuro/Psych  Headaches, PSYCHIATRIC DISORDERS Anxiety    GI/Hepatic GERD  ,  Endo/Other    Renal/GU      Musculoskeletal  (+) Arthritis ,   Abdominal   Peds  Hematology   Anesthesia Other Findings   Reproductive/Obstetrics                             Anesthesia Physical Anesthesia Plan  ASA: III  Anesthesia Plan: General   Post-op Pain Management:    Induction: Intravenous  PONV Risk Score and Plan: 2 and Ondansetron, Dexamethasone, Midazolam and Treatment may vary due to age or medical condition  Airway Management Planned: Oral ETT  Additional Equipment: Arterial line  Intra-op Plan:   Post-operative Plan:   Informed Consent: I have reviewed the patients History and Physical, chart, labs and discussed the procedure including the risks, benefits and alternatives for the proposed anesthesia with the patient or authorized representative who has indicated his/her understanding and acceptance.     Plan Discussed with: CRNA, Anesthesiologist and Surgeon  Anesthesia Plan Comments:         Anesthesia Quick Evaluation

## 2017-04-04 NOTE — Anesthesia Procedure Notes (Signed)
Arterial Line Insertion Start/End4/04/2017 3:07 PM, 04/04/2017 3:16 PM Performed by: Achille Rich, MD  Patient location: OR. Preanesthetic checklist: patient identified, IV checked, site marked, risks and benefits discussed, surgical consent, monitors and equipment checked, pre-op evaluation, timeout performed and anesthesia consent Lidocaine 1% used for infiltration Left, brachial was placed Catheter size: 20 Fr Hand hygiene performed  and maximum sterile barriers used   Attempts: 1 Procedure performed using ultrasound guided (unable to print image) technique. Following insertion, dressing applied. Post procedure assessment: normal and unchanged  Patient tolerated the procedure well with no immediate complications.

## 2017-04-04 NOTE — Transfer of Care (Signed)
Immediate Anesthesia Transfer of Care Note  Patient: Mario Wyatt  Procedure(s) Performed: LEAD EXTRACTION AND NEW LEAD PLACEMENT AND CHANGE OUT OF OLD PACEMAKER (N/A Chest)  Patient Location: PACU  Anesthesia Type:General  Level of Consciousness: drowsy  Airway & Oxygen Therapy: Patient Spontanous Breathing and Patient connected to nasal cannula oxygen  Post-op Assessment: Report given to RN and Post -op Vital signs reviewed and stable  Post vital signs: Reviewed and stable  Last Vitals:  Vitals Value Taken Time  BP 105/65 04/04/2017  6:42 PM  Temp 36.7 C 04/04/2017  6:42 PM  Pulse 72 04/04/2017  6:48 PM  Resp 20 04/04/2017  6:48 PM  SpO2 97 % 04/04/2017  6:48 PM  Vitals shown include unvalidated device data.  Last Pain:  Vitals:   04/04/17 1842  TempSrc:   PainSc: Asleep      Patients Stated Pain Goal: 0 (04/04/17 1301)  Complications: No apparent anesthesia complications

## 2017-04-04 NOTE — Progress Notes (Signed)
  Echocardiogram Echocardiogram Transesophageal has been performed.  Mario Wyatt 04/04/2017, 3:38 PM

## 2017-04-04 NOTE — Progress Notes (Signed)
Orthopedic Tech Progress Note Patient Details:  Mario Wyatt 28-Mar-1990 [id number]  Ortho Devices Type of Ortho Device: Arm sling Ortho Device/Splint Location: LUE Ortho Device/Splint Interventions: Ordered, Application   Post Interventions Patient Tolerated: Well Instructions Provided: Care of device   Jennye Moccasin 04/04/2017, 7:10 PM

## 2017-04-04 NOTE — Anesthesia Procedure Notes (Signed)
Procedure Name: Intubation Date/Time: 04/04/2017 3:02 PM Performed by: Shirlyn Goltz, CRNA Pre-anesthesia Checklist: Patient identified, Emergency Drugs available, Suction available and Patient being monitored Patient Re-evaluated:Patient Re-evaluated prior to induction Oxygen Delivery Method: Circle system utilized Induction Type: IV induction Ventilation: Mask ventilation without difficulty Laryngoscope Size: Mac and 3 Grade View: Grade II Tube type: Oral Tube size: 7.5 mm Number of attempts: 1 Airway Equipment and Method: Stylet Placement Confirmation: ETT inserted through vocal cords under direct vision,  positive ETCO2 and breath sounds checked- equal and bilateral Secured at: 22 cm Tube secured with: Tape Dental Injury: Teeth and Oropharynx as per pre-operative assessment

## 2017-04-04 NOTE — Op Note (Signed)
EP procedure note  Procedure performed: Extraction of 28 year old atrial and ventricular pacing leads, followed by left upper extremity venography, followed by coronary sinus venography, followed by insertion of a new left ventricular pacing lead, followed by removal of the old dual-chamber pacemaker, and insertion of a new dual-chamber biventricular pacemaker.  Preoperative diagnosis: Congenital complete heart block, status post permanent pacemaker insertion, with pacing induced left ventricular dysfunction, ejection fraction 35%  Postoperative diagnosis: Same as preoperative diagnosis  Description of the procedure:   After informed consent was obtained, the patient was taken to the  operating room in the fasting state.  After the usual preparation and draping,  invasive hemodynamic monitoring was carried out with the aid of the anesthesia service.  A central venous catheter was placed percutaneously in the right femoral vein.  Attention was then turned to the dual-chamber pacing system.  The patient had previously had 2 new atrial and ventricular pacing leads placed 3 years ago.  This was carried out when his old atrial and ventricular leads failed.  A 5 cm incision was carried out.  Electrocautery was utilized to dissect down to the pacemaker pocket.  The generator was removed with gentle traction.  The dense fibrous scar tissue was freed up with electrocautery.  Attention was turned to the 28 year old [employer] atrial pacing lead.  A stylette was inserted into the body of the lead.  Next, the lead was cut and a [employer] locking stylette was inserted into the body of the lead.  The lead was fixed with a stylette.  A proximal suture was placed on the proximal portion of the lead.  The [employer] 11 [place] mini mechanical dissection catheter was advanced over the lead.  A combination of traction and countertraction, pressure and counterpressure was applied to the lead body.  After traversing  into the innominate vein, the longer [employer] 11 [place] extraction catheter was advanced over the atrial pacing lead and over time the scar tissue was freed up and the lead was removed in toto.  Attention was then turned to the ventricular lead which had been placed 20 years ago.  The same technique was applied as was to the atrial lead.  Unfortunately, there was a break in the lead and the locking stylette could not be advanced more than about one third of the way into the RV lead.  The lead was fixed in place.  The same [employer] 11 [place] mini extraction sheath was advanced and unfortunately the right ventricular pacing lead broke.  The newer atrial and RV pacing leads were evaluated and found to be still working satisfactorily.    At this point, the 6 French sheath in the right femoral vein was removed and a new [employer] was advanced over a guidewire and into the inferior vena cava right atrium junction.  The Cook snare was then advanced through the workstation and used to grab the old RV pacing lead.  The lead was brought into the workstation and the entire 28 year old pacing lead and workstation were removed with the exception of a very small piece of the tip of the RV lead which was embedded in the right ventricle.  There were no hemodynamic sequelae with extraction of both of the 28 year old pacing leads. Pressure was held over the right femoral vein to achieve hemostasis.    At this point, attention was turned to insertion of a new left ventricular lead.  10 cc of IV contrast was injected into the left upper extremity venous  system which demonstrated that the left subclavian vein was patent.  The left subclavian vein was then punctured and a wire used to pass through any stenoses.  The [facility] Jude guiding catheter along with a 6 French hexapolar EP catheter were inserted into the right atrium.  The coronary sinus was easily cannulated.  Venography of the coronary sinus was  carried out demonstrating a posterior lateral vein which coursed almost to the apex.  The [facility] Jude quadripolar LV pacing lead, serial [id number], was advanced over an angioplasty guidewire and into the posterior lateral vein.  The lead was liberated from the guiding catheter in the usual manner and the lead was secured to the fascia with silk suture.  In addition the sewing sleeve was secured with silk suture.  At this point, the pocket was irrigated with antibiotic irrigation.  Electrocautery was utilized to assure hemostasis which was quite difficult.  The new [facility] biventricular pacemaker, serial [id number] was connected to the old right atrial and right ventricular pacing lead and the new left ventricular pacing lead and placed back in the subcutaneous pocket.  The pocket was irrigated with antibiotic irrigation.  The incision was closed with 2 layers of Vicryl suture.  Benzoin and Steri-Strips were painted on the skin.  The patient was returned to the recovery area in stable condition.  Complications: There were no immediate procedure complications  Conclusion: Successful extraction of a 28 year old atrial and ventricular pacing leads which were redundant, successful insertion of a new left ventricular pacing lead, with removal of the previously implanted dual-chamber pacemaker and insertion of a new biventricular pacemaker utilizing coronary sinus and left upper extremity venography in a patient with complete heart block, pacing induced left bundle branch block, and chronic systolic heart failure, ejection fraction 35%.  Lewayne Bunting, MD

## 2017-04-04 NOTE — Interval H&P Note (Signed)
History and Physical Interval Note:  04/04/2017 1:58 PM  Mario Wyatt  has presented today for surgery, with the diagnosis of COMPLETE HEART BLOCK,PACING INDUCED LEFT BUNDLE BRANCH BLOCK,MALFUNCTION PACING LEADS  The various methods of treatment have been discussed with the patient and family. After consideration of risks, benefits and other options for treatment, the patient has consented to  Procedure(s): EXTRACT TWO PACING LEADS THATS BROKEN, ADD NEW LV LEAD (N/A) as a surgical intervention .  The patient's history has been reviewed, patient examined, no change in status, stable for surgery.  I have reviewed the patient's chart and labs.  Questions were answered to the patient's satisfaction.     Lewayne Bunting

## 2017-04-04 NOTE — Telephone Encounter (Signed)
LMOVM reminding pt to send remote transmission.   

## 2017-04-04 NOTE — Consult Note (Signed)
         CARDIOTHORACIC SURGERY CONSULTATION REPORT  PCP is Verlon Au, MD Referring Provider is Marinus Maw, MD   Reason for consultation:     Surgical Backup for Pacemaker/Defibrillator Lead Extraction  Total Duration of Backup Provided: 160 minutes    Harriet Sutphen H. Cornelius Moras, MD    Salvatore Decent Cornelius Moras, MD 04/04/2017 5:38 PM

## 2017-04-04 NOTE — Anesthesia Postprocedure Evaluation (Signed)
Anesthesia Post Note  Patient: Mario Wyatt  Procedure(s) Performed: LEAD EXTRACTION AND NEW LEAD PLACEMENT AND CHANGE OUT OF OLD PACEMAKER (N/A Chest)     Patient location during evaluation: PACU Anesthesia Type: General Level of consciousness: awake and alert Pain management: pain level controlled Vital Signs Assessment: post-procedure vital signs reviewed and stable Respiratory status: spontaneous breathing, nonlabored ventilation, respiratory function stable and patient connected to nasal cannula oxygen Cardiovascular status: blood pressure returned to baseline and stable Postop Assessment: no apparent nausea or vomiting Anesthetic complications: no    Last Vitals:  Vitals:   04/04/17 1938 04/04/17 2000  BP: 115/76 127/85  Pulse: 82 75  Resp: 20 20  Temp: 36.7 C (!) 36.4 C  SpO2: 97% 94%    Last Pain:  Vitals:   04/04/17 2000  TempSrc: Oral  PainSc: Asleep                 Derward Marple COKER

## 2017-04-05 ENCOUNTER — Other Ambulatory Visit: Payer: Self-pay

## 2017-04-05 ENCOUNTER — Ambulatory Visit (HOSPITAL_COMMUNITY): Payer: Medicaid Other

## 2017-04-05 ENCOUNTER — Encounter (HOSPITAL_COMMUNITY): Payer: Self-pay | Admitting: General Practice

## 2017-04-05 DIAGNOSIS — Z91013 Allergy to seafood: Secondary | ICD-10-CM | POA: Diagnosis not present

## 2017-04-05 DIAGNOSIS — I420 Dilated cardiomyopathy: Secondary | ICD-10-CM | POA: Diagnosis present

## 2017-04-05 DIAGNOSIS — Z7982 Long term (current) use of aspirin: Secondary | ICD-10-CM | POA: Diagnosis not present

## 2017-04-05 DIAGNOSIS — Z950 Presence of cardiac pacemaker: Secondary | ICD-10-CM

## 2017-04-05 DIAGNOSIS — Z91040 Latex allergy status: Secondary | ICD-10-CM | POA: Diagnosis not present

## 2017-04-05 DIAGNOSIS — T82190A Other mechanical complication of cardiac electrode, initial encounter: Secondary | ICD-10-CM | POA: Diagnosis not present

## 2017-04-05 MED ORDER — OXYCODONE-ACETAMINOPHEN 5-325 MG PO TABS: 1.0000 | ORAL_TABLET | Freq: Four times a day (QID) | ORAL | 0 refills | Status: DC | PRN

## 2017-04-05 MED ORDER — OXYCODONE-ACETAMINOPHEN 5-325 MG PO TABS
2.0000 | ORAL_TABLET | Freq: Four times a day (QID) | ORAL | Status: DC | PRN
  Administered 2017-04-05: 2 via ORAL
  Filled 2017-04-05: qty 2

## 2017-04-05 MED ORDER — ACETAMINOPHEN 325 MG PO TABS: 325.0000 mg | ORAL_TABLET | ORAL | Status: DC | PRN

## 2017-04-05 NOTE — Discharge Instructions (Signed)
Supplemental Discharge Instructions for  Pacemaker Patients  Activity No heavy lifting or vigorous activity with your left/right arm for 6 to 8 weeks.  Do not raise your left/right arm above your head for one week.  Gradually raise your affected arm as drawn below.           __4/5/19                         04/07/17                        04/08/17                04/11/17  NO DRIVING for 1 week    ; you may begin driving on 1/61/09    .  WOUND CARE - Keep the wound area clean and dry.  Do not get this area wet for one week. No showers for one week; you may shower on  04/09/17 and pat area dry   . - The tape/steri-strips on your wound will fall off; do not pull them off.  No bandage is needed on the site.  DO  NOT apply any creams, oils, or ointments to the wound area. - If you notice any drainage or discharge from the wound, any swelling or bruising at the site, or you develop a fever > 101? F after you are discharged home, call the office at once.  Special Instructions - You are still able to use cellular telephones; use the ear opposite the side where you have your pacemaker/defibrillator.  Avoid carrying your cellular phone near your device. - When traveling through airports, show security personnel your identification card to avoid being screened in the metal detectors.  Ask the security personnel to use the hand wand. - Avoid arc welding equipment, MRI testing (magnetic resonance imaging), TENS units (transcutaneous nerve stimulators).  Call the office for questions about other devices. - Avoid electrical appliances that are in poor condition or are not properly grounded. - Microwave ovens are safe to be near or to operate.    Pacemaker Implantation, Adult, Care After This sheet gives you information about how to care for yourself after your procedure. Your health care provider may also give you more specific instructions. If you have problems or questions, contact your health care  provider. What can I expect after the procedure? After the procedure, it is common to have:  Mild pain.  Slight bruising.  Some swelling over the incision.  A slight bump over the skin where the device was placed. Sometimes, it is possible to feel the device under the skin. This is normal.  Follow these instructions at home: Medicines  Take over-the-counter and prescription medicines only as told by your health care provider.  If you were prescribed an antibiotic medicine, take it as told by your health care provider. Do not stop taking the antibiotic even if you start to feel better. Wound care  Do not remove the bandage on your chest until directed to do so by your health care provider.  After your bandage is removed, you may see pieces of tape called skin adhesive strips over the area where the cut was made (incision site). Let them fall off on their own.  Check the incision site every day to make sure it is not infected, bleeding, or starting to pull apart.  Do not use lotions or ointments near the incision site unless  directed to do so.  Keep the incision area clean and dry for 2-3 days after the procedure or as directed by your health care provider. It takes several weeks for the incision site to completely heal.  Do not take baths, swim, or use a hot tub for 7-10 days or as otherwise directed by your health care provider. Activity  Do not drive or use heavy machinery while taking prescription pain medicine.  Do not drive for 24 hours if you were given a medicine to help you relax (sedative).  Check with your health care provider before you start to drive or play sports.  Avoid sudden jerking, pulling, or chopping movements that pull your upper arm far away from your body. Avoid these movements for at least 6 weeks or as long as told by your health care provider.  Do not lift your upper arm above your shoulders for at least 6 weeks or as long as told by your health care  provider. This means no tennis, golf, or swimming.  You may go back to work when your health care provider says it is okay. Pacemaker care  You may be shown how to transfer data from your pacemaker through the phone to your health care provider.  Always let all health care providers know about your pacemaker before you have any medical procedures or tests.  Wear a medical ID bracelet or necklace stating that you have a pacemaker. Carry a pacemaker ID card with you at all times.  Your pacemaker battery will last for 5-15 years. Routine checks by your health care provider will let the health care provider know when the battery is starting to run down. The pacemaker will need to be replaced when the battery starts to run down.  Do not use amateur Proofreader. Other electrical devices are safe to use, including power tools, lawn mowers, and speakers. If you are unsure of whether something is safe to use, ask your health care provider.  When using your cell phone, hold it to the ear opposite the pacemaker. Do not leave your cell phone in a pocket over the pacemaker.  Avoid places or objects that have a strong electric or magnetic field, including: ? Airport Actuary. When at the airport, let officials know that you have a pacemaker. ? Power plants. ? Large electrical generators. ? Radiofrequency transmission towers, such as cell phone and radio towers. General instructions  Weigh yourself every day. If you suddenly gain weight, fluid may be building up in your body.  Keep all follow-up visits as told by your health care provider. This is important. Contact a health care provider if:  You gain weight suddenly.  Your legs or feet swell.  It feels like your heart is fluttering or skipping beats (heart palpitations).  You have chills or a fever.  You have more redness, swelling, or pain around your incisions.  You have more fluid or blood coming  from your incisions.  Your incisions feel warm to the touch.  You have pus or a bad smell coming from your incisions.

## 2017-04-05 NOTE — Discharge Summary (Addendum)
Patient ID: Mario Wyatt,  MRN: [id number], DOB/AGE: November 05, 1990 27 y.o.  Admit date: 04/04/2017 Discharge date: 04/05/2017  Primary Care Provider: Verlon Au, MD Primary Cardiologist: Dr Graciela Husbands  Discharge Diagnoses Active Problems:   S/P cardiac pacemaker procedure, 09/08/14, [employer] Jude medical, gen change   Adult attention deficit disorder   Chronic systolic heart failure (HCC)   Dilated cardiomyopathy (HCC)    Procedures: Lead extraction and pacemaker upgrade to a BiV device 04/04/17-   Hospital Course: Pt admitted for elective lead extraction and upgrade of his pacemaker. He has a h/o congenital complete heart block and underwent PPM insertion initially 20 years ago with generator replacement twice, the last in 2016 when he was found to have broken leads. They were both capped and insertion of 2 new active fix PM leads were inserted. He has developed worsening CHF symptoms and was found to have worsening LV dysfunction with an EF of 35%. He has a pacing induced LBBB. He has broken and capped 28 year old leads. Dr Ladona Ridgel and Dr Graciela Husbands felt his young age and need for upgrade to a Biv device makes removal of these non-functioning leads indicated.  The pt tolerated the procedure well. Dr Ladona Ridgel saw him on 04/05/17 and felt he was stable for discharge. F/U in the device clinic will be arranged.    Discharge Vitals:  Blood pressure (!) 116/59, pulse 76, temperature 97.6 F (36.4 C), temperature source Oral, resp. rate 19, weight 126 lb 8 oz (57.4 kg), SpO2 97 %.  Chest- clear CV-RRR Extrem- no edema Neuro - intact  Telemetry- paced  Labs: No results found for this or any previous visit (from the past 24 hour(s)).  Disposition:    Discharge Medications:  Allergies as of 04/05/2017      Reactions   Iodine Anaphylaxis   Shellfish Allergy Anaphylaxis   Latex Hives, Rash      Medication List    STOP taking these medications   aspirin EC 81 MG tablet     TAKE  these medications   acetaminophen 325 MG tablet Commonly known as:  TYLENOL Take 1-2 tablets (325-650 mg total) by mouth every 4 (four) hours as needed for mild pain.   albuterol 108 (90 Base) MCG/ACT inhaler Commonly known as:  PROVENTIL HFA;VENTOLIN HFA Inhale 2 puffs into the lungs every 4 (four) hours as needed for wheezing or shortness of breath.   carvedilol 3.125 MG tablet Commonly known as:  COREG Take 1 tablet (3.125 mg total) by mouth 2 (two) times daily.   EPINEPHrine 0.3 mg/0.3 mL Soaj injection Commonly known as:  EPIPEN 2-PAK Inject 0.3 mLs (0.3 mg total) into the muscle once.   ibuprofen 200 MG tablet Commonly known as:  ADVIL,MOTRIN Take 200 mg by mouth every 6 (six) hours as needed for mild pain or moderate pain.   lisinopril 5 MG tablet Commonly known as:  PRINIVIL,ZESTRIL Take 1 tablet (5 mg total) by mouth daily.   oxyCODONE-acetaminophen 5-325 MG tablet Commonly known as:  PERCOCET/ROXICET Take 1-2 tablets by mouth every 6 (six) hours as needed for moderate pain.        Duration of Discharge Encounter: Greater than 30 minutes including physician time.  Jolene Provost PA-C 04/05/2017 3:29 PM   EP Attending  Patient seen and examined. Agree with above. The patient is doing well after PM lead extraction and removal of his DDD PM and insertion of a biv PPM and LV pacing lead. On CXR  his LV lead has moved back slightly but is working normally. His device interogation under my direct observation demonstrates normal biv PPM function. Usual followup. He had extensive scar tissue debrided and will be given some percocet for pain control.  Leonia Reeves.D.

## 2017-04-07 LAB — BPAM RBC
BLOOD PRODUCT EXPIRATION DATE: 201904262359
BLOOD PRODUCT EXPIRATION DATE: 201904292359
BLOOD PRODUCT EXPIRATION DATE: 201904292359
Blood Product Expiration Date: 201904292359
ISSUE DATE / TIME: 201904041522
ISSUE DATE / TIME: 201904041522
UNIT TYPE AND RH: 5100
UNIT TYPE AND RH: 5100
Unit Type and Rh: 5100
Unit Type and Rh: 5100

## 2017-04-07 LAB — TYPE AND SCREEN
ABO/RH(D): O POS
ANTIBODY SCREEN: NEGATIVE
UNIT DIVISION: 0
UNIT DIVISION: 0
UNIT DIVISION: 0
Unit division: 0

## 2017-04-09 ENCOUNTER — Telehealth: Payer: Self-pay | Admitting: Internal Medicine

## 2017-04-09 NOTE — Telephone Encounter (Signed)
Received STD from [facility] via fax.  Sent to [employer].

## 2017-04-10 ENCOUNTER — Telehealth: Payer: Self-pay | Admitting: Internal Medicine

## 2017-04-10 NOTE — Telephone Encounter (Signed)
°  1. Has your device fired? No  2. Is you device beeping? No  3. Are you experiencing draining or swelling at device site? No  4. Are you calling to see if we received your device transmission? No  5. Have you passed out? No  Patient states that bandage is coming off from implantation surgery. Patient is scheduled to come in on 04-16-17 but would like suggestions in the meantime.    Please route to Device Clinic Pool

## 2017-04-10 NOTE — Telephone Encounter (Signed)
Spoke with pt and informed him that it was ok for the larger bandage to come off but if the steri-strips under the large bandage start to come off prior to wound check to call back, informed pt that after tomorrow he was ok to shower. Pt voiced understanding .

## 2017-04-16 ENCOUNTER — Ambulatory Visit (INDEPENDENT_AMBULATORY_CARE_PROVIDER_SITE_OTHER): Payer: Medicaid Other | Admitting: *Deleted

## 2017-04-16 DIAGNOSIS — Q246 Congenital heart block: Secondary | ICD-10-CM

## 2017-04-16 LAB — CUP PACEART INCLINIC DEVICE CHECK
Battery Voltage: 3.07 V
Date Time Interrogation Session: 20190416164106
Implantable Lead Implant Date: 20160907
Implantable Lead Implant Date: 20190404
Implantable Lead Location: 753858
Implantable Lead Location: 753859
Implantable Lead Location: 753860
Implantable Lead Model: 1948
Implantable Pulse Generator Implant Date: 20190404
Lead Channel Impedance Value: 587.5 Ohm
Lead Channel Impedance Value: 712.5 Ohm
Lead Channel Pacing Threshold Amplitude: 0.75 V
Lead Channel Pacing Threshold Amplitude: 1 V
Lead Channel Pacing Threshold Amplitude: 1 V
Lead Channel Pacing Threshold Pulse Width: 0.5 ms
Lead Channel Pacing Threshold Pulse Width: 0.5 ms
Lead Channel Pacing Threshold Pulse Width: 0.5 ms
Lead Channel Pacing Threshold Pulse Width: 0.5 ms
Lead Channel Pacing Threshold Pulse Width: 0.5 ms
Lead Channel Setting Pacing Amplitude: 2 V
Lead Channel Setting Pacing Pulse Width: 0.5 ms
Lead Channel Setting Sensing Sensitivity: 2.5 mV
MDC IDC LEAD IMPLANT DT: 20160907
MDC IDC MSMT BATTERY REMAINING LONGEVITY: 63 mo
MDC IDC MSMT LEADCHNL RA IMPEDANCE VALUE: 450 Ohm
MDC IDC MSMT LEADCHNL RA PACING THRESHOLD AMPLITUDE: 1 V
MDC IDC MSMT LEADCHNL RA SENSING INTR AMPL: 1 mV
MDC IDC MSMT LEADCHNL RV PACING THRESHOLD AMPLITUDE: 0.75 V
MDC IDC MSMT LEADCHNL RV PACING THRESHOLD AMPLITUDE: 0.75 V
MDC IDC MSMT LEADCHNL RV PACING THRESHOLD PULSEWIDTH: 0.5 ms
MDC IDC MSMT LEADCHNL RV SENSING INTR AMPL: 5.9 mV
MDC IDC SET LEADCHNL LV PACING AMPLITUDE: 3.5 V
MDC IDC SET LEADCHNL LV PACING PULSEWIDTH: 0.5 ms
MDC IDC SET LEADCHNL RA PACING AMPLITUDE: 1.875
MDC IDC STAT BRADY RA PERCENT PACED: 30 %
MDC IDC STAT BRADY RV PERCENT PACED: 99.82 %
Pulse Gen Model: 3262
Pulse Gen Serial Number: 9003554

## 2017-04-16 NOTE — Progress Notes (Signed)
Wound CRT-P device check in clinic. Steri-Strips removed, incision edges approximated no redness. Soft hematoma noted no bruising. Normal device function. Thresholds, sensing, impedance consistent with implant measurements. Histograms appropriate for patient and level of activity. No mode switches or ventricular high rate episodes. Patient bi-ventricularly pacing 99% of the time. Device programmed with appropriate safety margins. Estimated longevity 5.3-6.8 years. Pt educated about wound care lifting restrictions. ROV w/ DC 04/24/17 for re-check of hematoma.

## 2017-04-17 ENCOUNTER — Telehealth: Payer: Self-pay | Admitting: Internal Medicine

## 2017-04-17 DIAGNOSIS — M7918 Myalgia, other site: Secondary | ICD-10-CM

## 2017-04-17 DIAGNOSIS — G8929 Other chronic pain: Secondary | ICD-10-CM | POA: Insufficient documentation

## 2017-04-17 NOTE — Telephone Encounter (Signed)
Patient dropped off [employer] Disability paperwork yesterday while in office. He completed release form and was told of the $25.00 charge that goes along with this paperwork. He is to call me back today with the fax number of where paperwork needs to be faxed to and if it needs to go to anyone's attention. He is also aware of the 10/14 day process.

## 2017-04-24 ENCOUNTER — Ambulatory Visit (INDEPENDENT_AMBULATORY_CARE_PROVIDER_SITE_OTHER): Payer: Medicaid Other | Admitting: *Deleted

## 2017-04-24 DIAGNOSIS — I429 Cardiomyopathy, unspecified: Secondary | ICD-10-CM | POA: Diagnosis not present

## 2017-04-24 DIAGNOSIS — Q246 Congenital heart block: Secondary | ICD-10-CM | POA: Diagnosis not present

## 2017-04-24 LAB — CUP PACEART INCLINIC DEVICE CHECK
Date Time Interrogation Session: 20190424163622
Implantable Lead Implant Date: 20160907
Implantable Lead Implant Date: 20160907
Implantable Lead Implant Date: 20190404
Implantable Lead Location: 753858
Implantable Lead Location: 753860
Implantable Lead Model: 1948
MDC IDC LEAD LOCATION: 753859
MDC IDC PG IMPLANT DT: 20190404
MDC IDC PG SERIAL: 9003554
Pulse Gen Model: 3262

## 2017-04-24 NOTE — Progress Notes (Signed)
Mr. Claxton presents today to the device clinic for a wound re-check due to a small hematoma previously noted. CRTP pocket is without redness, swelling or drainage. Stitch noted at medial incision- clipped, antibiotic ointment and bandaid applied. Patient advised to monitor area, wash with warm soapy water daily and to call back if he notes any redness, drainage, swelling or if the stitch reappears. Ecchymosis noted in late-healing stage around CRTP pocket.  Mr. Chrest reports that for the last 2 days he has felt fatigued, noticed that his chest is "heavy" and he has noticed some swelling in his hands. Device interrogated for episodes/ function. No episodes, normal device function. Thoracic impedance diagnostic still initializing. Will route to Dr. Ladona Ridgel for review of symptoms.  He inquires about his disability paperwork- Kim Meseimore notes to have the paperwork but it is a 10-14 day process- he verbalizes understanding. ROV with Dr. Ladona Ridgel 07/09/17.

## 2017-04-25 ENCOUNTER — Encounter

## 2017-04-25 ENCOUNTER — Encounter: Payer: Self-pay | Admitting: Internal Medicine

## 2017-04-30 ENCOUNTER — Telehealth: Payer: Self-pay | Admitting: Internal Medicine

## 2017-04-30 NOTE — Telephone Encounter (Signed)
Call returned to Pt.  Pt requesting a letter to return to work on May 06, 2017 with appropriate restrictions. Requests this be sent to his case manager Ms. Magda Paganini at fax # [phone].  Confirmed fax #.  Pt states he needs this letter today.  Will complete and fax as requested.

## 2017-04-30 NOTE — Telephone Encounter (Signed)
New Message  Pt request call from nurse, refused reason please call

## 2017-05-07 ENCOUNTER — Telehealth: Payer: Self-pay | Admitting: Internal Medicine

## 2017-05-07 NOTE — Telephone Encounter (Signed)
Follow up   Pt calling back to give fax numbers for his job  Fax: [phone] or [phone]

## 2017-05-07 NOTE — Telephone Encounter (Signed)
Letter faxed to Pt employer per request.  No further action needed at this time.

## 2017-05-07 NOTE — Telephone Encounter (Signed)
New Message   Pt is requesting a letter stating that he can return back to work on light duty and restrictions. Please call

## 2017-05-29 ENCOUNTER — Ambulatory Visit (INDEPENDENT_AMBULATORY_CARE_PROVIDER_SITE_OTHER): Payer: Medicaid Other | Admitting: *Deleted

## 2017-05-29 ENCOUNTER — Telehealth: Payer: Self-pay | Admitting: Internal Medicine

## 2017-05-29 DIAGNOSIS — Z950 Presence of cardiac pacemaker: Secondary | ICD-10-CM

## 2017-05-29 DIAGNOSIS — Q246 Congenital heart block: Secondary | ICD-10-CM

## 2017-05-29 LAB — CUP PACEART INCLINIC DEVICE CHECK
Date Time Interrogation Session: 20190529123814
Implantable Lead Implant Date: 20160907
Implantable Lead Implant Date: 20160907
Implantable Lead Location: 753858
MDC IDC LEAD IMPLANT DT: 20190404
MDC IDC LEAD LOCATION: 753859
MDC IDC LEAD LOCATION: 753860
MDC IDC PG IMPLANT DT: 20190404
MDC IDC PG SERIAL: 9003554
Pulse Gen Model: 3262

## 2017-05-29 NOTE — Telephone Encounter (Signed)
Spoke with patient who reports redness at incision site. Additionally reports that incision is hot to the touch and that he noticed drainage from site. Patient added on to the DC schedule.

## 2017-05-29 NOTE — Telephone Encounter (Signed)
New message    Pt came in about his incision. He said that he needs to be seen because his incision is infected. And he has not been feeling well. He asked if he should go to the ER. Please call.

## 2017-05-29 NOTE — Progress Notes (Signed)
Mario Wyatt presents today as a walk-in with complaints of device site pain and drainage from incision. His CRTP was implanted on 04/04/17 by Dr. Lovena Le. Device pocket is without edema, redness or drainage at this time. His incision line is reddened and painful, keloids appear to be forming along the incision. Small stitch noted on the medial edge of his left chest incision, I was able to remove that with tweezers from a suture removal kit. Dr. Lovena Le evaluated incision, determined his pain was from the stitch abscess and keloids. He was advised to use OTC hydrocortisone cream to help with keloid formation and to wash site with warm soapy water daily. He questioned Dr. Lovena Le about disability due to his condition, Dr. Lovena Le strongly encouraged him to keep working if there was any way possible for his mental and physical health. Mario Wyatt has our direct phone number to the [facility] to call with any additional concerns. He has a follow up appt with Dr. Lovena Le 07/09/17.

## 2017-06-20 ENCOUNTER — Telehealth: Payer: Self-pay | Admitting: Internal Medicine

## 2017-06-20 NOTE — Telephone Encounter (Signed)
Mr. Dupell calling with c/o heaviness, tenderness and pain around his device site. He reports that the site looks the same as when I saw him 05/29/17. He reports that he sleeps 8 hours a night but is falling asleep. Normal device function 05/29/17, home monitor updated and no alerts. I have advised Mr. Stapf to follow up with his primary care provider and let him know that his device has been checked recently with no abnormalities. He verbalizes understanding.

## 2017-06-20 NOTE — Telephone Encounter (Signed)
Spoke with device CMA, will transfer call to EP triage

## 2017-06-20 NOTE — Telephone Encounter (Signed)
New Message    Pt c/o of Chest Pain: STAT if CP now or developed within 24 hours  1. Are you having CP right now? yes  2. Are you experiencing any other symptoms (ex. SOB, nausea, vomiting, sweating)? Fatigue, pressure and heavy   3. How long have you been experiencing CP? On and off but its been consistent today   4. Is your CP continuous or coming and going? continuous  5. Have you taken Nitroglycerin? No  ?

## 2017-06-26 ENCOUNTER — Ambulatory Visit: Payer: Medicaid Other | Admitting: Physical Therapy

## 2017-07-09 ENCOUNTER — Encounter: Payer: Self-pay | Admitting: Internal Medicine

## 2017-07-10 ENCOUNTER — Other Ambulatory Visit: Payer: Self-pay

## 2017-07-10 ENCOUNTER — Ambulatory Visit: Payer: Medicaid Other | Attending: Family Medicine | Admitting: Physical Therapy

## 2017-07-10 ENCOUNTER — Encounter: Payer: Self-pay | Admitting: Physical Therapy

## 2017-07-10 DIAGNOSIS — M545 Low back pain, unspecified: Secondary | ICD-10-CM

## 2017-07-10 DIAGNOSIS — M542 Cervicalgia: Secondary | ICD-10-CM | POA: Insufficient documentation

## 2017-07-10 DIAGNOSIS — M62830 Muscle spasm of back: Secondary | ICD-10-CM | POA: Insufficient documentation
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
## 2017-07-12 ENCOUNTER — Encounter: Payer: Self-pay | Admitting: Internal Medicine

## 2017-07-15 ENCOUNTER — Encounter: Payer: Self-pay | Admitting: Internal Medicine

## 2017-10-23 NOTE — Progress Notes (Deleted)
Electrophysiology Office Note Date: 10/23/2017  ID:  Mario Wyatt, DOB 07/11/1990, MRN [id number]  PCP: Verlon Au, MD Electrophysiologist: Klein/Taylor  CC: Pacemaker follow-up  Mario Wyatt is a 27 y.o. male seen today for Dr Mario Wyatt.  He presents today for routine electrophysiology followup.  Since last being seen in our clinic, the patient reports doing very well.  He denies chest pain, palpitations, dyspnea, PND, orthopnea, nausea, vomiting, dizziness, syncope, edema, weight gain, or early satiety.  Device History: STJ dual chamber PPM implanted 1999 for congenital complete heart block; gen change 2008; gen change 2016 with 2 new leads placed at that time; upgrade to CRTP with old leads extracted 2019 by Dr Mario Wyatt   Past Medical History:  Diagnosis Date  . Anxiety   . Arthritis    "wrists, knees; probably all over my body" (04/05/2017)  . Asthma   . Attention deficit hyperactivity disorder (ADHD)    pt denies this hx on 04/05/2017  . Cardiomyopathy (HCC)   . Chronic back pain    "right neck to lower back; shoulders" (04/05/2017)  . Congenital complete AV block   . Daily headache   . GERD (gastroesophageal reflux disease)   . Macular degeneration    bilateral  . Migraine    "a couple/month" (04/05/2017)  . Pacemaker    Placed when pt was 27 years old.  . S/P cardiac pacemaker procedure, 09/08/14, St Jude medical 09/09/2014  . Wears glasses    Past Surgical History:  Procedure Laterality Date  . CARDIAC PACEMAKER PLACEMENT  2007   second degree heart block  . EP IMPLANTABLE DEVICE N/A 09/08/2014   Procedure: Pacemaker Implant;  Surgeon: Duke Salvia, MD;  Location: [facility] INVASIVE CV LAB;  Service: Cardiovascular;  Laterality: N/A;  . HERNIA REPAIR    . PACEMAKER INSERTION  1999  . PACEMAKER LEAD REMOVAL N/A 04/04/2017   Procedure: LEAD EXTRACTION AND NEW LEAD PLACEMENT AND CHANGE OUT OF OLD PACEMAKER;  Surgeon: Marinus Maw, MD;  Location: [facility] OR;   Service: Cardiovascular;  Laterality: N/A;  . PERMANENT PACEMAKER GENERATOR CHANGE  04/04/2017   LEAD EXTRACTION AND NEW LEAD PLACEMENT AND CHANGE OUT OF OLD PACEMAKER  . UMBILICAL HERNIA REPAIR     "when I was a child"    Current Outpatient Medications  Medication Sig Dispense Refill  . acetaminophen (TYLENOL) 325 MG tablet Take 1-2 tablets (325-650 mg total) by mouth every 4 (four) hours as needed for mild pain.    Marland Kitchen albuterol (PROVENTIL HFA;VENTOLIN HFA) 108 (90 BASE) MCG/ACT inhaler Inhale 2 puffs into the lungs every 4 (four) hours as needed for wheezing or shortness of breath. 1 Inhaler 0  . carvedilol (COREG) 3.125 MG tablet Take 1 tablet (3.125 mg total) by mouth 2 (two) times daily. 60 tablet 6  . EPINEPHrine (EPIPEN 2-PAK) 0.3 mg/0.3 mL IJ SOAJ injection Inject 0.3 mLs (0.3 mg total) into the muscle once. 2 Device 0  . ibuprofen (ADVIL,MOTRIN) 200 MG tablet Take 200 mg by mouth every 6 (six) hours as needed for mild pain or moderate pain.    Marland Kitchen lisinopril (PRINIVIL,ZESTRIL) 5 MG tablet Take 1 tablet (5 mg total) by mouth daily. 30 tablet 6  . oxyCODONE-acetaminophen (PERCOCET/ROXICET) 5-325 MG tablet Take 1-2 tablets by mouth every 6 (six) hours as needed for moderate pain. 20 tablet 0   No current facility-administered medications for this visit.     Allergies:   Iodine; Shellfish allergy; and Latex  Social History: Social History   Socioeconomic History  . Marital status: Single    Spouse name: Not on file  . Number of children: Not on file  . Years of education: Not on file  . Highest education level: Not on file  Occupational History  . Not on file  Social Needs  . Financial resource strain: Not on file  . Food insecurity:    Worry: Not on file    Inability: Not on file  . Transportation needs:    Medical: Not on file    Non-medical: Not on file  Tobacco Use  . Smoking status: Former Smoker    Packs/day: 1.00    Years: 10.00    Pack years: 10.00    Types:  Cigarettes    Last attempt to quit: 10/01/2013    Years since quitting: 4.0  . Smokeless tobacco: Never Used  Substance and Sexual Activity  . Alcohol use: Not Currently  . Drug use: No  . Sexual activity: Yes  Lifestyle  . Physical activity:    Days per week: Not on file    Minutes per session: Not on file  . Stress: Not on file  Relationships  . Social connections:    Talks on phone: Not on file    Gets together: Not on file    Attends religious service: Not on file    Active member of club or organization: Not on file    Attends meetings of clubs or organizations: Not on file    Relationship status: Not on file  . Intimate partner violence:    Fear of current or ex partner: Not on file    Emotionally abused: Not on file    Physically abused: Not on file    Forced sexual activity: Not on file  Other Topics Concern  . Not on file  Social History Narrative  . Not on file    Family History: Family History  Problem Relation Age of Onset  . Hypertension Mother   . Diabetes Mother   . Bipolar disorder Mother   . Drug abuse Mother   . Glaucoma Father   . Anesthesia problems Neg Hx   . Hypotension Neg Hx   . Malignant hyperthermia Neg Hx   . Pseudochol deficiency Neg Hx      Review of Systems: All other systems reviewed and are otherwise negative except as noted above.   Physical Exam: VS:  There were no vitals taken for this visit. , BMI There is no height or weight on file to calculate BMI.  GEN- The patient is well appearing, alert and oriented x 3 today.   HEENT: normocephalic, atraumatic; sclera clear, conjunctiva pink; hearing intact; oropharynx clear; neck supple  Lungs- Clear to ausculation bilaterally, normal work of breathing.  No wheezes, rales, rhonchi Heart- Regular rate and rhythm (paced) GI- soft, non-tender, non-distended, bowel sounds present  Extremities- no clubbing, cyanosis, or edema  MS- no significant deformity or atrophy Skin- warm and  dry, no rash or lesion; PPM pocket well healed Psych- euthymic mood, full affect Neuro- strength and sensation are intact  PPM Interrogation- reviewed in detail today,  See PACEART report  EKG:  EKG is ordered today. The ekg ordered today shows ***  Recent Labs: 03/25/2017: BUN 13; Creatinine, Ser 1.09; Hemoglobin 15.6; Platelets 267; Potassium 4.0; Sodium 139   Wt Readings from Last 3 Encounters:  04/05/17 126 lb 8 oz (57.4 kg)  03/25/17 117 lb 3.2 oz (53.2 kg)  03/18/17 120 lb (54.4 kg)     Other studies Reviewed: Additional studies/ records that were reviewed today include: Dr Lubertha Basque office notes   Assessment and Plan:  1.  Congenital complete heart block Normal PPM function See Pace Art report No changes today  2.  Chronic systolic heart failure Euvolemic on exam Update echo post CRT    Current medicines are reviewed at length with the patient today.   The patient does not have concerns regarding his medicines.  The following changes were made today:  none  Labs/ tests ordered today include: echo No orders of the defined types were placed in this encounter.    Disposition:   Follow up with Arsenio Katz, Dr Graciela Husbands 3 months     Signed, Gypsy Balsam, NP 10/23/2017 10:55 AM  [facility] [address] [place] [address] [phone] (office) [phone] (fax)

## 2017-10-24 ENCOUNTER — Encounter: Payer: Self-pay | Admitting: Nurse Practitioner

## 2017-10-28 ENCOUNTER — Encounter: Payer: Self-pay | Admitting: Nurse Practitioner

## 2017-10-28 DIAGNOSIS — S129XXS Fracture of neck, unspecified, sequela: Secondary | ICD-10-CM | POA: Insufficient documentation

## 2017-11-12 ENCOUNTER — Other Ambulatory Visit: Payer: Self-pay | Admitting: Internal Medicine

## 2017-11-12 MED ORDER — CARVEDILOL 3.125 MG PO TABS: 3.1250 mg | ORAL_TABLET | Freq: Two times a day (BID) | ORAL | 3 refills | Status: DC

## 2017-11-12 MED ORDER — LISINOPRIL 5 MG PO TABS: 5.0000 mg | ORAL_TABLET | Freq: Every day | ORAL | 3 refills | Status: DC

## 2017-11-18 ENCOUNTER — Encounter: Payer: Self-pay | Admitting: Internal Medicine

## 2017-12-01 ENCOUNTER — Encounter (HOSPITAL_COMMUNITY): Payer: Self-pay

## 2017-12-01 ENCOUNTER — Ambulatory Visit (HOSPITAL_COMMUNITY)
Admission: EM | Admit: 2017-12-01 | Discharge: 2017-12-01 | Disposition: A | Discharge status: Home / Self Care | Payer: Medicaid Other | Attending: Family Medicine | Admitting: Family Medicine

## 2017-12-01 DIAGNOSIS — K0889 Other specified disorders of teeth and supporting structures: Secondary | ICD-10-CM

## 2017-12-01 MED ORDER — PENICILLIN V POTASSIUM 500 MG PO TABS: 500.0000 mg | ORAL_TABLET | Freq: Four times a day (QID) | ORAL | 0 refills | Status: AC

## 2017-12-01 MED ORDER — TRAMADOL HCL 50 MG PO TABS: 50.0000 mg | ORAL_TABLET | Freq: Four times a day (QID) | ORAL | 0 refills | Status: DC | PRN

## 2017-12-01 NOTE — ED Triage Notes (Signed)
Pt present abscess on the left side/tooth, pain started 1 week ago.

## 2017-12-01 NOTE — Discharge Instructions (Signed)
Penicillin for dental infection Tramadol for pain Follow-up with your dentist as planned

## 2017-12-01 NOTE — ED Provider Notes (Signed)
[facility] Provider Note   CSN: [id number] Arrival date & time: 12/01/17  1739     History   Chief Complaint Chief Complaint  Patient presents with  . Abscess    left side tooth pain     HPI Mario Wyatt is a 27 y.o. male.  Pt was given abx by his dentist but lost the prescription. He is requesting more abx and pain medication. He is taking ibuprofen with no relief. No fever, chill, facial swelling, neck pain, neck swelling or trismus.   ROS per HPI   Dental Pain   This is a new problem. The current episode started more than 2 days ago. The problem occurs constantly. The problem has not changed since onset.The pain is at a severity of 8/10. The pain is moderate. He has tried nothing for the symptoms. The treatment provided no relief.    Past Medical History:  Diagnosis Date  . Anxiety   . Arthritis    "wrists, knees; probably all over my body" (04/05/2017)  . Asthma   . Attention deficit hyperactivity disorder (ADHD)    pt denies this hx on 04/05/2017  . Cardiomyopathy (HCC)   . Chronic back pain    "right neck to lower back; shoulders" (04/05/2017)  . Congenital complete AV block   . Daily headache   . GERD (gastroesophageal reflux disease)   . Macular degeneration    bilateral  . Migraine    "a couple/month" (04/05/2017)  . Pacemaker    Placed when pt was 27 years old.  . S/P cardiac pacemaker procedure, 09/08/14, St Jude medical 09/09/2014  . Wears glasses     Patient Active Problem List   Diagnosis Date Noted  . Dilated cardiomyopathy (HCC) 04/05/2017  . Chronic systolic heart failure (HCC) 04/04/2017  . Pacemaker at end of battery life 09/09/2014  . S/P cardiac pacemaker procedure, 09/08/14, St Jude medical, gen change 09/09/2014  . Complete heart block (HCC) 09/08/2014  . Adult attention deficit disorder 05/26/2014  . Cervical pain 05/26/2014  . Back pain, chronic 05/26/2014  . AV block, High Grade, congenital  06/11/2013  . Pacemaker 05/27/2013  . Vomiting 04/05/2013  . Diarrhea 04/05/2013  . Nausea 04/05/2013  . Artificial cardiac pacemaker 03/21/2011  . 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 CARDIAC PACEMAKER PLACEMENT  2007   second degree heart block  . EP IMPLANTABLE DEVICE N/A 09/08/2014   Procedure: Pacemaker Implant;  Surgeon: Duke Salvia, MD;  Location: [facility] INVASIVE CV LAB;  Service: Cardiovascular;  Laterality: N/A;  . HERNIA REPAIR    . PACEMAKER INSERTION  1999  . PACEMAKER LEAD REMOVAL N/A 04/04/2017   Procedure: LEAD EXTRACTION AND NEW LEAD PLACEMENT AND CHANGE OUT OF OLD PACEMAKER;  Surgeon: Marinus Maw, MD;  Location: [facility] OR;  Service: Cardiovascular;  Laterality: N/A;  . PERMANENT PACEMAKER GENERATOR CHANGE  04/04/2017   LEAD EXTRACTION AND NEW LEAD PLACEMENT AND CHANGE OUT OF OLD PACEMAKER  . UMBILICAL HERNIA REPAIR     "when I was a child"        Home Medications    Prior to Admission medications   Medication Sig Start Date End Date Taking? Authorizing Provider  acetaminophen (TYLENOL) 325 MG tablet Take 1-2 tablets (325-650 mg total) by mouth every 4 (four) hours as needed for mild pain. 04/05/17  Kilroy, Luke K, PA-C  albuterol (PROVENTIL HFA;VENTOLIN HFA) 108 (90 BASE) MCG/ACT inhaler Inhale 2 puffs into the lungs every 4 (four) hours as needed for wheezing or shortness of breath. 04/11/14   Devoria Albe, MD  carvedilol (COREG) 3.125 MG tablet Take 1 tablet (3.125 mg total) by mouth 2 (two) times daily. Please make yearly appt with Dr.Klein for February for future refill1st attempt 11/12/17 03/12/18  Duke Salvia, MD  EPINEPHrine (EPIPEN 2-PAK) 0.3 mg/0.3 mL IJ SOAJ injection Inject 0.3 mLs (0.3 mg total) into the muscle once. 04/11/14   Devoria Albe, MD  ibuprofen (ADVIL,MOTRIN) 200 MG tablet Take 200 mg by mouth every 6 (six) hours as needed for  mild pain or moderate pain.    [provider]  lisinopril (PRINIVIL,ZESTRIL) 5 MG tablet Take 1 tablet (5 mg total) by mouth daily. Please make yearly appt with Dr. Graciela Husbands for February for future refills. 1st attempt 11/12/17   Duke Salvia, MD  penicillin v potassium (VEETID) 500 MG tablet Take 1 tablet (500 mg total) by mouth 4 (four) times daily for 10 days. 12/01/17 12/11/17  Dahlia Byes A, NP  traMADol (ULTRAM) 50 MG tablet Take 1 tablet (50 mg total) by mouth every 6 (six) hours as needed. 12/01/17   Dahlia Byes A, NP  ALBUTEROL IN Inhale into the lungs as needed.    03/10/11  [provider]    Family History Family History  Problem Relation Age of Onset  . Hypertension Mother   . Diabetes Mother   . Bipolar disorder Mother   . Drug abuse Mother   . Glaucoma Father   . Anesthesia problems Neg Hx   . Hypotension Neg Hx   . Malignant hyperthermia Neg Hx   . Pseudochol deficiency Neg Hx     Social History Social History   Tobacco Use  . Smoking status: Former Smoker    Packs/day: 1.00    Years: 10.00    Pack years: 10.00    Types: Cigarettes    Last attempt to quit: 10/01/2013    Years since quitting: 4.1  . Smokeless tobacco: Never Used  Substance Use Topics  . Alcohol use: Not Currently  . Drug use: No     Allergies   Iodine; Shellfish allergy; and Latex   Review of Systems Review of Systems   Physical Exam Updated Vital Signs BP 133/80 (BP Location: Right Arm)   Pulse 76   Temp 98.3 F (36.8 C)   Resp 18   SpO2 99%   Physical Exam  Constitutional: He appears well-developed and well-nourished.  HENT:  Head: Normocephalic.  Dental caries to left upper and lower mouth.  Mild gingival swelling. Tender to touch.   Eyes: Conjunctivae are normal.  Neck: Normal range of motion.  Pulmonary/Chest: Effort normal.  Nursing note and vitals reviewed.    ED Treatments / Results  Labs (all labs ordered are listed, but only abnormal results  are displayed) Labs Reviewed - No data to display  EKG None  Radiology No results found.  Procedures Procedures (including critical care time)  Medications Ordered in ED Medications - No data to display   Initial Impression / Assessment and Plan / ED Course  I have reviewed the triage vital signs and the nursing notes.  Pertinent labs & imaging results that were available during my care of the patient were reviewed by me and considered in my medical decision making (see chart for details).     Treating  for dental infection with PCN Tramadol for pain Follow up with dentis for further evaluation.   Final Clinical Impressions(s) / ED Diagnoses   Final diagnoses:  Pain, dental    ED Discharge Orders         Ordered    penicillin v potassium (VEETID) 500 MG tablet  4 times daily     12/01/17 1838    traMADol (ULTRAM) 50 MG tablet  Every 6 hours PRN     12/01/17 Elon Jester, NP 12/01/17 2144

## 2017-12-18 ENCOUNTER — Encounter: Payer: Self-pay | Admitting: Internal Medicine

## 2017-12-18 ENCOUNTER — Telehealth: Payer: Self-pay | Admitting: *Deleted

## 2017-12-18 NOTE — Telephone Encounter (Signed)
   [facility] Pre-operative Risk Assessment    Request for surgical clearance:  1. What type of surgery is being performed? EXTRACTION OF MULTIPLE TEETH   2. When is this surgery scheduled? 12/19/17   3. What type of clearance is required (medical clearance vs. Pharmacy clearance to hold med vs. Both)? MEDICAL  4. Are there any medications that need to be held prior to surgery and how long? NO ANTICOAGS LISTED    5. Practice name and name of physician performing surgery? [facility]; DR. Grayling   6. What is your office phone number [phone]    7.   What is your office fax number       [phone]  8.   Anesthesia type (None, local, MAC, general) ? LOCAL   Mario Wyatt 12/18/2017, 3:29 PM  _________________________________________________________________   (provider comments below)

## 2017-12-19 ENCOUNTER — Encounter: Payer: Self-pay | Admitting: Internal Medicine

## 2017-12-19 NOTE — Telephone Encounter (Signed)
   Primary Cardiologist: Dr. Ladona Ridgel.  Chart reviewed as part of pre-operative protocol coverage. Simple dental extractions are considered low risk procedures per guidelines and generally do not require any specific cardiac clearance. It is also generally accepted that for simple extractions and dental cleanings, there is no need to interrupt blood thinner therapy.   I've discussed w/ Dr. Graciela Husbands (EP) given is pacemaker. SBE prophylaxis is not required for the patient.  I will route this recommendation to the requesting party via Epic fax function and remove from pre-op pool.  Please call with questions.  Robbie Lis, PA-C 12/19/2017, 2:09 PM

## 2018-01-02 ENCOUNTER — Ambulatory Visit (INDEPENDENT_AMBULATORY_CARE_PROVIDER_SITE_OTHER): Payer: Medicaid Other

## 2018-01-02 DIAGNOSIS — I442 Atrioventricular block, complete: Secondary | ICD-10-CM | POA: Diagnosis not present

## 2018-01-02 NOTE — Progress Notes (Signed)
Remote pacemaker transmission.   

## 2018-01-05 LAB — CUP PACEART REMOTE DEVICE CHECK
Battery Remaining Longevity: 74 mo
Battery Remaining Percentage: 95.5 %
Battery Voltage: 2.98 V
Brady Statistic AP VP Percent: 36 %
Brady Statistic AP VS Percent: 1 %
Brady Statistic AS VS Percent: 1 %
Brady Statistic RA Percent Paced: 36 %
Date Time Interrogation Session: 20200102091714
Implantable Lead Implant Date: 20160907
Implantable Lead Implant Date: 20190404
Implantable Lead Location: 753858
Implantable Lead Location: 753859
Implantable Lead Model: 1948
Lead Channel Impedance Value: 460 Ohm
Lead Channel Pacing Threshold Amplitude: 1 V
Lead Channel Pacing Threshold Pulse Width: 0.5 ms
Lead Channel Sensing Intrinsic Amplitude: 1.8 mV
Lead Channel Sensing Intrinsic Amplitude: 9.5 mV
Lead Channel Setting Pacing Amplitude: 3.5 V
Lead Channel Setting Pacing Pulse Width: 0.5 ms
Lead Channel Setting Pacing Pulse Width: 0.5 ms
MDC IDC LEAD IMPLANT DT: 20160907
MDC IDC LEAD LOCATION: 753860
MDC IDC MSMT LEADCHNL LV IMPEDANCE VALUE: 800 Ohm
MDC IDC MSMT LEADCHNL LV PACING THRESHOLD AMPLITUDE: 0.75 V
MDC IDC MSMT LEADCHNL LV PACING THRESHOLD PULSEWIDTH: 0.5 ms
MDC IDC MSMT LEADCHNL RV IMPEDANCE VALUE: 610 Ohm
MDC IDC MSMT LEADCHNL RV PACING THRESHOLD AMPLITUDE: 0.75 V
MDC IDC MSMT LEADCHNL RV PACING THRESHOLD PULSEWIDTH: 0.5 ms
MDC IDC PG IMPLANT DT: 20190404
MDC IDC SET LEADCHNL RA PACING AMPLITUDE: 2 V
MDC IDC SET LEADCHNL RV PACING AMPLITUDE: 2 V
MDC IDC SET LEADCHNL RV SENSING SENSITIVITY: 2.5 mV
MDC IDC STAT BRADY AS VP PERCENT: 64 %
Pulse Gen Model: 3262
Pulse Gen Serial Number: 9003554

## 2018-01-20 ENCOUNTER — Telehealth: Payer: Self-pay

## 2018-01-20 NOTE — Telephone Encounter (Signed)
   [facility] Pre-operative Risk Assessment    Request for surgical clearance:  1. What type of surgery is being performed? Dental Implant   2. When is this surgery scheduled? TBD   3. What type of clearance is required (medical clearance vs. Pharmacy clearance to hold med vs. Both)? Medical  4. Are there any medications that need to be held prior to surgery and how long? No   5. Practice name and name of physician performing surgery? Dr. Sheppard Coil Kim/[facility]   6. What is your office phone number [phone]    7.   What is your office fax number [phone]  8.   Anesthesia type (None, local, MAC, general) ? General   Mady Haagensen 01/20/2018, 8:25 AM  _________________________________________________________________   (provider comments below)

## 2018-01-22 NOTE — Telephone Encounter (Signed)
Clearance faxed via Epic and faxed machine to [facility] faxed [phone]

## 2018-01-22 NOTE — Telephone Encounter (Signed)
   Primary Cardiologist: Dr Graciela Husbands  Chart reviewed as part of pre-operative protocol coverage. Given past medical history and time since last visit, based on ACC/AHA guidelines, THOMS DERUITER would be at acceptable risk for the planned procedure without further cardiovascular testing.   No need for SBE prophylaxis.   I will route this recommendation to the requesting party via Epic fax function and remove from pre-op pool.  Please call with questions.  Corine Shelter, PA-C 01/22/2018, 2:55 PM

## 2018-02-06 ENCOUNTER — Encounter: Payer: Self-pay | Admitting: Internal Medicine

## 2018-02-06 ENCOUNTER — Ambulatory Visit (INDEPENDENT_AMBULATORY_CARE_PROVIDER_SITE_OTHER): Payer: Medicaid Other | Admitting: Internal Medicine

## 2018-02-06 ENCOUNTER — Encounter (INDEPENDENT_AMBULATORY_CARE_PROVIDER_SITE_OTHER): Payer: Self-pay

## 2018-02-06 VITALS — BP 122/88 | HR 68 | Ht 64.0 in | Wt 122.4 lb

## 2018-02-06 DIAGNOSIS — Z95810 Presence of automatic (implantable) cardiac defibrillator: Secondary | ICD-10-CM

## 2018-02-06 DIAGNOSIS — I429 Cardiomyopathy, unspecified: Secondary | ICD-10-CM | POA: Diagnosis not present
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
## 2018-02-06 NOTE — Patient Instructions (Signed)
Medication Instructions:  Your physician recommends that you continue on your current medications as directed. Please refer to the Current Medication list given to you today.  Labwork: None ordered.  Testing/Procedures: Your physician has requested that you have an echocardiogram. Echocardiography is a painless test that uses sound waves to create images of your heart. It provides your doctor with information about the size and shape of your heart and how well your heart's chambers and valves are working. This procedure takes approximately one hour. There are no restrictions for this procedure.   Follow-Up: Your physician recommends that you schedule a follow-up appointment in:   9 months with Dr Graciela Husbands  Any Other Special Instructions Will Be Listed Below (If Applicable).     If you need a refill on your cardiac medications before your next appointment, please call your pharmacy.

## 2018-02-07 ENCOUNTER — Telehealth: Payer: Self-pay | Admitting: Internal Medicine

## 2018-02-07 LAB — CUP PACEART INCLINIC DEVICE CHECK
Date Time Interrogation Session: 20200207093647
Implantable Lead Implant Date: 20160907
Implantable Lead Implant Date: 20160907
Implantable Lead Implant Date: 20190404
Implantable Lead Location: 753858
Implantable Lead Location: 753859
Implantable Lead Location: 753860
Implantable Lead Model: 1948
Implantable Pulse Generator Implant Date: 20190404
Pulse Gen Model: 3262
Pulse Gen Serial Number: 9003554

## 2018-02-07 NOTE — Telephone Encounter (Signed)
LVM for return call. 

## 2018-02-07 NOTE — Telephone Encounter (Signed)
New Message           Patient is calling today to let Dr. Graciela Husbands know that he was fired today due to medical reasons. Patient would like a call back concerning this message.

## 2018-02-12 NOTE — Telephone Encounter (Signed)
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 At this time he is not interested. He had some questions regarding filing for disability and I directed him to speak with the social security office first.   He states he is feeling ok, just a little stressed out. I advised him he could call the office anytime with questions or needs.

## 2018-02-17 ENCOUNTER — Inpatient Hospital Stay (HOSPITAL_COMMUNITY)
Admission: EM | Admit: 2018-02-17 | Discharge: 2018-02-24 | DRG: 885 | Disposition: A | Discharge status: Other Acute Inpatient Hospital | Payer: Medicaid Other | Attending: Internal Medicine | Admitting: Internal Medicine

## 2018-02-17 ENCOUNTER — Emergency Department (HOSPITAL_COMMUNITY): Payer: Medicaid Other

## 2018-02-17 ENCOUNTER — Encounter (HOSPITAL_COMMUNITY): Payer: Self-pay | Admitting: Emergency Medicine

## 2018-02-17 ENCOUNTER — Other Ambulatory Visit: Payer: Self-pay

## 2018-02-17 DIAGNOSIS — S7011XA Contusion of right thigh, initial encounter: Secondary | ICD-10-CM | POA: Diagnosis present

## 2018-02-17 DIAGNOSIS — Z813 Family history of other psychoactive substance abuse and dependence: Secondary | ICD-10-CM

## 2018-02-17 DIAGNOSIS — R220 Localized swelling, mass and lump, head: Secondary | ICD-10-CM | POA: Diagnosis present

## 2018-02-17 DIAGNOSIS — S0990XA Unspecified injury of head, initial encounter: Secondary | ICD-10-CM

## 2018-02-17 DIAGNOSIS — S0003XA Contusion of scalp, initial encounter: Secondary | ICD-10-CM | POA: Diagnosis present

## 2018-02-17 DIAGNOSIS — J45909 Unspecified asthma, uncomplicated: Secondary | ICD-10-CM | POA: Diagnosis present

## 2018-02-17 DIAGNOSIS — J9811 Atelectasis: Secondary | ICD-10-CM | POA: Diagnosis present

## 2018-02-17 DIAGNOSIS — Z83511 Family history of glaucoma: Secondary | ICD-10-CM

## 2018-02-17 DIAGNOSIS — M5430 Sciatica, unspecified side: Secondary | ICD-10-CM | POA: Diagnosis present

## 2018-02-17 DIAGNOSIS — K219 Gastro-esophageal reflux disease without esophagitis: Secondary | ICD-10-CM | POA: Diagnosis present

## 2018-02-17 DIAGNOSIS — F909 Attention-deficit hyperactivity disorder, unspecified type: Secondary | ICD-10-CM | POA: Diagnosis present

## 2018-02-17 DIAGNOSIS — N179 Acute kidney failure, unspecified: Secondary | ICD-10-CM | POA: Diagnosis present

## 2018-02-17 DIAGNOSIS — Z781 Physical restraint status: Secondary | ICD-10-CM

## 2018-02-17 DIAGNOSIS — Z833 Family history of diabetes mellitus: Secondary | ICD-10-CM

## 2018-02-17 DIAGNOSIS — E872 Acidosis: Secondary | ICD-10-CM | POA: Diagnosis present

## 2018-02-17 DIAGNOSIS — E162 Hypoglycemia, unspecified: Secondary | ICD-10-CM | POA: Diagnosis present

## 2018-02-17 DIAGNOSIS — S0240DA Maxillary fracture, left side, initial encounter for closed fracture: Secondary | ICD-10-CM

## 2018-02-17 DIAGNOSIS — Z91040 Latex allergy status: Secondary | ICD-10-CM

## 2018-02-17 DIAGNOSIS — Z950 Presence of cardiac pacemaker: Secondary | ICD-10-CM

## 2018-02-17 DIAGNOSIS — E8729 Other acidosis: Secondary | ICD-10-CM

## 2018-02-17 DIAGNOSIS — Y92039 Unspecified place in apartment as the place of occurrence of the external cause: Secondary | ICD-10-CM

## 2018-02-17 DIAGNOSIS — T182XXA Foreign body in stomach, initial encounter: Secondary | ICD-10-CM

## 2018-02-17 DIAGNOSIS — Z818 Family history of other mental and behavioral disorders: Secondary | ICD-10-CM

## 2018-02-17 DIAGNOSIS — Z8249 Family history of ischemic heart disease and other diseases of the circulatory system: Secondary | ICD-10-CM

## 2018-02-17 DIAGNOSIS — J9601 Acute respiratory failure with hypoxia: Secondary | ICD-10-CM | POA: Diagnosis present

## 2018-02-17 DIAGNOSIS — S60921A Unspecified superficial injury of right hand, initial encounter: Secondary | ICD-10-CM | POA: Diagnosis present

## 2018-02-17 DIAGNOSIS — Z888 Allergy status to other drugs, medicaments and biological substances status: Secondary | ICD-10-CM

## 2018-02-17 DIAGNOSIS — W2201XA Walked into wall, initial encounter: Secondary | ICD-10-CM | POA: Diagnosis present

## 2018-02-17 DIAGNOSIS — R451 Restlessness and agitation: Secondary | ICD-10-CM | POA: Diagnosis present

## 2018-02-17 DIAGNOSIS — R9431 Abnormal electrocardiogram [ECG] [EKG]: Secondary | ICD-10-CM | POA: Diagnosis present

## 2018-02-17 DIAGNOSIS — H3530 Unspecified macular degeneration: Secondary | ICD-10-CM | POA: Diagnosis present

## 2018-02-17 DIAGNOSIS — K210 Gastro-esophageal reflux disease with esophagitis: Secondary | ICD-10-CM | POA: Diagnosis present

## 2018-02-17 DIAGNOSIS — I429 Cardiomyopathy, unspecified: Secondary | ICD-10-CM | POA: Diagnosis present

## 2018-02-17 DIAGNOSIS — G8929 Other chronic pain: Secondary | ICD-10-CM | POA: Diagnosis present

## 2018-02-17 DIAGNOSIS — T189XXD Foreign body of alimentary tract, part unspecified, subsequent encounter: Secondary | ICD-10-CM

## 2018-02-17 DIAGNOSIS — Z79891 Long term (current) use of opiate analgesic: Secondary | ICD-10-CM

## 2018-02-17 DIAGNOSIS — M4802 Spinal stenosis, cervical region: Secondary | ICD-10-CM | POA: Diagnosis present

## 2018-02-17 DIAGNOSIS — S60922A Unspecified superficial injury of left hand, initial encounter: Secondary | ICD-10-CM | POA: Diagnosis present

## 2018-02-17 DIAGNOSIS — R410 Disorientation, unspecified: Secondary | ICD-10-CM | POA: Diagnosis present

## 2018-02-17 DIAGNOSIS — Z79899 Other long term (current) drug therapy: Secondary | ICD-10-CM

## 2018-02-17 DIAGNOSIS — Z87891 Personal history of nicotine dependence: Secondary | ICD-10-CM

## 2018-02-17 DIAGNOSIS — I10 Essential (primary) hypertension: Secondary | ICD-10-CM | POA: Diagnosis present

## 2018-02-17 DIAGNOSIS — Q246 Congenital heart block: Secondary | ICD-10-CM

## 2018-02-17 DIAGNOSIS — R52 Pain, unspecified: Secondary | ICD-10-CM

## 2018-02-17 DIAGNOSIS — K08409 Partial loss of teeth, unspecified cause, unspecified class: Secondary | ICD-10-CM | POA: Diagnosis present

## 2018-02-17 DIAGNOSIS — T189XXA Foreign body of alimentary tract, part unspecified, initial encounter: Secondary | ICD-10-CM

## 2018-02-17 DIAGNOSIS — F23 Brief psychotic disorder: Secondary | ICD-10-CM

## 2018-02-17 DIAGNOSIS — F209 Schizophrenia, unspecified: Principal | ICD-10-CM | POA: Diagnosis present

## 2018-02-17 DIAGNOSIS — M50222 Other cervical disc displacement at C5-C6 level: Secondary | ICD-10-CM | POA: Diagnosis present

## 2018-02-17 LAB — CBC WITH DIFFERENTIAL/PLATELET
Abs Immature Granulocytes: 0.04 10*3/uL (ref 0.00–0.07)
BASOS ABS: 0 10*3/uL (ref 0.0–0.1)
BASOS PCT: 0 %
Eosinophils Absolute: 0 10*3/uL (ref 0.0–0.5)
Eosinophils Relative: 0 %
HCT: 43.2 % (ref 39.0–52.0)
Hemoglobin: 14.5 g/dL (ref 13.0–17.0)
Immature Granulocytes: 0 %
LYMPHS PCT: 11 %
Lymphs Abs: 1 10*3/uL (ref 0.7–4.0)
MCH: 29.8 pg (ref 26.0–34.0)
MCHC: 33.6 g/dL (ref 30.0–36.0)
MCV: 88.9 fL (ref 80.0–100.0)
MONO ABS: 0.8 10*3/uL (ref 0.1–1.0)
Monocytes Relative: 8 %
NRBC: 0 % (ref 0.0–0.2)
Neutro Abs: 7.4 10*3/uL (ref 1.7–7.7)
Neutrophils Relative %: 81 %
PLATELETS: 259 10*3/uL (ref 150–400)
RBC: 4.86 MIL/uL (ref 4.22–5.81)
RDW: 12.2 % (ref 11.5–15.5)
WBC: 9.3 10*3/uL (ref 4.0–10.5)

## 2018-02-17 LAB — SALICYLATE LEVEL

## 2018-02-17 LAB — COMPREHENSIVE METABOLIC PANEL
ALT: 26 U/L (ref 0–44)
ANION GAP: 12 (ref 5–15)
AST: 62 U/L — ABNORMAL HIGH (ref 15–41)
Albumin: 4.2 g/dL (ref 3.5–5.0)
Alkaline Phosphatase: 56 U/L (ref 38–126)
BUN: 14 mg/dL (ref 6–20)
CHLORIDE: 101 mmol/L (ref 98–111)
CO2: 23 mmol/L (ref 22–32)
Calcium: 9.6 mg/dL (ref 8.9–10.3)
Creatinine, Ser: 1.28 mg/dL — ABNORMAL HIGH (ref 0.61–1.24)
Glucose, Bld: 80 mg/dL (ref 70–99)
POTASSIUM: 3.5 mmol/L (ref 3.5–5.1)
SODIUM: 136 mmol/L (ref 135–145)
TOTAL PROTEIN: 7.3 g/dL (ref 6.5–8.1)
Total Bilirubin: 1.2 mg/dL (ref 0.3–1.2)

## 2018-02-17 LAB — ACETAMINOPHEN LEVEL

## 2018-02-17 LAB — I-STAT TROPONIN, ED: Troponin i, poc: 0.03 ng/mL (ref 0.00–0.08)

## 2018-02-17 MED ORDER — KETAMINE HCL 50 MG/5ML IJ SOSY
1.0000 mg/kg | PREFILLED_SYRINGE | Freq: Once | INTRAMUSCULAR | Status: AC
  Administered 2018-02-17: 56 mg via INTRAVENOUS
  Filled 2018-02-17: qty 10

## 2018-02-17 MED ORDER — PROPOFOL 1000 MG/100ML IV EMUL
5.0000 ug/kg/min | INTRAVENOUS | Status: DC
  Administered 2018-02-17: 40 ug/kg/min via INTRAVENOUS
  Administered 2018-02-18: 5 ug/kg/min via INTRAVENOUS
  Administered 2018-02-18: 80 ug/kg/min via INTRAVENOUS
  Filled 2018-02-17 (×3): qty 100

## 2018-02-17 MED ORDER — KETAMINE HCL 50 MG/5ML IJ SOSY
PREFILLED_SYRINGE | INTRAMUSCULAR | Status: AC
  Filled 2018-02-17: qty 5

## 2018-02-17 MED ORDER — PROPOFOL 1000 MG/100ML IV EMUL
INTRAVENOUS | Status: AC
  Administered 2018-02-18: 80 ug/kg/min via INTRAVENOUS
  Filled 2018-02-17: qty 100
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
## 2018-02-17 NOTE — ED Provider Notes (Addendum)
[facility] EMERGENCY DEPARTMENT Provider Note   CSN: [id number] Arrival date & time: 02/17/18  2137    History   Chief Complaint Chief Complaint  Patient presents with  . Head Injury    HPI Mario Wyatt is a 28 y.o. male.      Head Injury  Location:  Frontal Mechanism of injury: self-inflicted   Pain details:    Quality:  Aching   Severity:  Severe   Duration:  1 day   Timing:  Constant   Progression:  Unchanged Chronicity:  New Worsened by:  Nothing Ineffective treatments:  None tried Associated symptoms: no blurred vision, no disorientation, no double vision, no loss of consciousness, no numbness, no seizures and no vomiting   Risk factors: not elderly and no obesity     Past Medical History:  Diagnosis Date  . Anxiety   . Arthritis    "wrists, knees; probably all over my body" (04/05/2017)  . Asthma   . Attention deficit hyperactivity disorder (ADHD)    pt denies this hx on 04/05/2017  . Cardiomyopathy ([place])   . Chronic back pain    "right neck to lower back; shoulders" (04/05/2017)  . Congenital complete AV block   . Daily headache   . GERD (gastroesophageal reflux disease)   . Macular degeneration    bilateral  . Migraine    "a couple/month" (04/05/2017)  . Pacemaker    Placed when pt was 28 years old.  . S/P cardiac pacemaker procedure, 09/08/14, [employer] medical 09/09/2014  . Wears glasses     Patient Active Problem List   Diagnosis Date Noted  . Cervical compression fracture, sequela 10/28/2017  . Chronic myofascial pain 04/17/2017  . Dilated cardiomyopathy ([place]) 04/05/2017  . Chronic systolic heart failure ([place]) 04/04/2017  . Mild intermittent asthma without complication 80/99/8338  . Asthma 07/03/2016  . Shellfish allergy 09/29/2014  . Tired 09/29/2014  . Pacemaker at end of battery life 09/09/2014  . S/P cardiac pacemaker procedure, 09/08/14, [employer] medical, gen change 09/09/2014  . Complete heart block ([place]) 09/08/2014   . Adult ADHD 05/26/2014  . Cervical pain 05/26/2014  . Chronic back pain 05/26/2014  . Neck pain 05/26/2014  . AV block, High Grade, congenital 06/11/2013  . Congenital complete atrioventricular block 06/11/2013  . Pacemaker 05/27/2013  . Vomiting 04/05/2013  . Diarrhea 04/05/2013  . Nausea 04/05/2013  . Artificial cardiac pacemaker 03/21/2011  . 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 Second degree AV block 03/21/2011  . Presence of cardiac pacemaker 03/21/2011  . 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 CARDIAC PACEMAKER PLACEMENT  2007   second degree heart block  . EP IMPLANTABLE DEVICE N/A 09/08/2014   Procedure: Pacemaker Implant;  Surgeon: Deboraha Sprang, MD;  Location: [place] [facility];  Service: Cardiovascular;  Laterality: N/A;  . HERNIA REPAIR    . PACEMAKER INSERTION  1999  . PACEMAKER LEAD REMOVAL N/A 04/04/2017   Procedure: LEAD EXTRACTION AND NEW LEAD PLACEMENT AND CHANGE OUT OF OLD PACEMAKER;  Surgeon: Evans Lance, MD;  Location: [place];  Service: Cardiovascular;  Laterality: N/A;  . PERMANENT PACEMAKER GENERATOR CHANGE  04/04/2017   LEAD EXTRACTION AND NEW LEAD PLACEMENT AND CHANGE OUT OF OLD PACEMAKER  . UMBILICAL HERNIA REPAIR     "when I was a child"        Home Medications  Prior to Admission medications   Medication Sig Start Date End Date Taking? Authorizing Provider  acetaminophen (TYLENOL) 325 MG tablet Take 1-2 tablets (325-650 mg total) by mouth every 4 (four) hours as needed for mild pain. 04/05/17   Erlene Quan, PA-C  albuterol (PROVENTIL HFA;VENTOLIN HFA) 108 (90 BASE) MCG/ACT inhaler Inhale 2 puffs into the lungs every 4 (four) hours as needed for wheezing or shortness of breath. 04/11/14   Rolland Porter, MD  carvedilol (COREG) 3.125 MG tablet Take 1 tablet (3.125 mg total) by mouth 2 (two) times daily. Please make yearly appt with Dr.Klein for February for future  refill1st attempt 11/12/17 03/12/18  Deboraha Sprang, MD  EPINEPHrine (EPIPEN 2-PAK) 0.3 mg/0.3 mL IJ SOAJ injection Inject 0.3 mLs (0.3 mg total) into the muscle once. 04/11/14   Rolland Porter, MD  ibuprofen (ADVIL,MOTRIN) 200 MG tablet Take 200 mg by mouth every 6 (six) hours as needed for mild pain or moderate pain.    [provider]  lisinopril (PRINIVIL,ZESTRIL) 5 MG tablet Take 1 tablet (5 mg total) by mouth daily. Please make yearly appt with Dr. Caryl Comes for February for future refills. 1st attempt 11/12/17   Deboraha Sprang, MD  traMADol (ULTRAM) 50 MG tablet Take 1 tablet (50 mg total) by mouth every 6 (six) hours as needed. 12/01/17   Orvan July, NP    Family History Family History  Problem Relation Age of Onset  . Hypertension Mother   . Diabetes Mother   . Bipolar disorder Mother   . Drug abuse Mother   . Glaucoma Father   . Anesthesia problems Neg Hx   . Hypotension Neg Hx   . Malignant hyperthermia Neg Hx   . Pseudochol deficiency Neg Hx     Social History Social History   Tobacco Use  . Smoking status: Former Smoker    Packs/day: 1.00    Years: 10.00    Pack years: 10.00    Types: Cigarettes    Last attempt to quit: 10/01/2013    Years since quitting: 4.3  . Smokeless tobacco: Never Used  Substance Use Topics  . Alcohol use: Not Currently  . Drug use: No     Allergies   Iodine; Shellfish allergy; and Latex   Review of Systems Review of Systems  Constitutional: Negative for chills and fever.  HENT: Negative for ear pain and sore throat.   Eyes: Negative for blurred vision, double vision, pain and visual disturbance.  Respiratory: Negative for cough and shortness of breath.   Cardiovascular: Negative for chest pain and palpitations.  Gastrointestinal: Negative for abdominal pain and vomiting.  Genitourinary: Negative for dysuria and hematuria.  Musculoskeletal: Negative for arthralgias and back pain.  Skin: Negative for color change and rash.   Neurological: Negative for seizures, loss of consciousness, syncope and numbness.  All other systems reviewed and are negative.    Physical Exam Updated Vital Signs BP 137/86   Pulse (!) 111   Temp 99.5 F (37.5 C) (Oral)   Resp (!) 42   Ht _0  (1.626 m)   Wt 55.5 kg   SpO2 100%   BMI 21.00 kg/m   Physical Exam Vitals signs and nursing note reviewed.  Constitutional:      Appearance: He is well-developed.     Comments: Patient resting comfortably, in no acute distress.  HENT:     Head:     Comments: Significant hematoma to the forehead with facial sparing on the bilateral midface  pressure was initially concerning for basilar skull fracture with hematoma below the right thigh.  No septal hematoma.    Right Ear: Tympanic membrane normal.     Left Ear: Tympanic membrane normal.  Eyes:     Extraocular Movements: Extraocular movements intact.     Conjunctiva/sclera: Conjunctivae normal.     Pupils: Pupils are equal, round, and reactive to light.  Neck:     Musculoskeletal: Neck supple.  Cardiovascular:     Rate and Rhythm: Normal rate and regular rhythm.     Heart sounds: No murmur.  Pulmonary:     Effort: Pulmonary effort is normal. No respiratory distress.     Breath sounds: Normal breath sounds.  Abdominal:     Palpations: Abdomen is soft.     Tenderness: There is no abdominal tenderness.  Musculoskeletal: Normal range of motion.  Skin:    General: Skin is warm and dry.  Neurological:     General: No focal deficit present.     Mental Status: He is alert. Mental status is at baseline.     Cranial Nerves: No cranial nerve deficit.     Sensory: No sensory deficit.     Motor: No weakness.     Coordination: Coordination normal.     Gait: Gait normal.     Deep Tendon Reflexes: Reflexes normal.     Comments: Normal neurological exam.      ED Treatments / Results  Labs (all labs ordered are listed, but only abnormal results are displayed) Labs Reviewed   COMPREHENSIVE METABOLIC PANEL - Abnormal; Notable for the following components:      Result Value   Creatinine, Ser 1.28 (*)    AST 62 (*)    All other components within normal limits  ACETAMINOPHEN LEVEL - Abnormal; Notable for the following components:   Acetaminophen (Tylenol), Serum <10 (*)    All other components within normal limits  POCT I-STAT 7, (LYTES, BLD GAS, ICA,H+H) - Abnormal; Notable for the following components:   pH, Arterial 7.050 (*)    pCO2 arterial 77.6 (*)    pO2, Arterial 207.0 (*)    Acid-base deficit 10.0 (*)    All other components within normal limits  CBC WITH DIFFERENTIAL/PLATELET  SALICYLATE LEVEL  RAPID HIV SCREEN (HIV 1/2 AB+AG)  RAPID URINE DRUG SCREEN, HOSP PERFORMED  HEPATITIS PANEL, ACUTE  COMPREHENSIVE METABOLIC PANEL  I-STAT TROPONIN, ED    EKG EKG Interpretation  Date/Time:  Monday February 17 2018 21:46:29 EST Ventricular Rate:  89 PR Interval:    QRS Duration: 124 QT Interval:  377 QTC Calculation: 459 R Axis:   -131 Text Interpretation:  Sinus rhythm VENTRICULAR PACED RHYTHM Probable inferior infarct, acute Anterior infarct, old Lateral leads are also involved since last tracing no significant change Confirmed by Noemi Chapel [phone]) on 02/17/2018 10:06:23 PM   Radiology Dg Hand 2 View Left  Result Date: 02/18/2018 CLINICAL DATA:  Initial evaluation for acute injury, hit head on wall, pain. EXAM: LEFT HAND - 2 VIEW COMPARISON:  None. FINDINGS: Single oblique view of the left hand demonstrates no acute fracture or dislocation. Joint spaces maintained. Soft tissue irregularity at the ulnar aspect of the mid left hand, suggesting small soft tissue injury. No radiopaque foreign body. IMPRESSION: 1. No acute osseous abnormality. 2. Focal soft tissue irregularity at the ulnar aspect of the left hand, suggesting soft tissue injury. No radiopaque foreign body. Electronically Signed   By: Jeannine Boga M.D.   On: 02/18/2018 00:37  Dg Chest Portable 1 View  Result Date: 02/18/2018 CLINICAL DATA:  Initial evaluation for acute injury, struck head on wall. EXAM: PORTABLE CHEST 1 VIEW COMPARISON:  Prior radiograph from 04/05/2017 FINDINGS: Endotracheal tube in place with tip positioned 5 cm above the carina. Enteric tube courses into the abdomen. Left-sided pacemaker/AICD in place. Cardiac and mediastinal silhouettes are stable, and remain within normal limits. Lungs mildly hypoinflated. No focal infiltrates. No edema or effusion. No pneumothorax. Visualized osseous structures within normal limits. IMPRESSION: 1. Tip of endotracheal tube position 5 cm above the carina. 2. Enteric tube courses into the upper abdomen, nonvisualization of tip or side hole. 3. Clear lungs.  No other active cardiopulmonary disease. Electronically Signed   By: Jeannine Boga M.D.   On: 02/18/2018 00:09   Dg Abd Portable 1v  Result Date: 02/18/2018 CLINICAL DATA:  Initial evaluation for foreign body. EXAM: PORTABLE ABDOMEN - 1 VIEW COMPARISON:  None. FINDINGS: Enteric tube in place with tip overlying the gastric body, side hole just beyond the GE junction. Bowel gas pattern within normal limits. Moderate retained stool within the colon. Metallic keys overlie the proximal stomach, suggestive of foreign body. Few adjacent subcentimeter linear densities. Additional adjacent rectangular electronic density, which also may reflect a foreign body. IMPRESSION: 1. Metallic keys and rectangular electronic object overlying the proximal stomach, which could reflect ingested foreign bodies. 2. Enteric tube in place with tip overlying the gastric body, side hole beyond the GE junction. 3. Nonobstructive bowel gas pattern. Electronically Signed   By: Jeannine Boga M.D.   On: 02/18/2018 00:42   Dg Hand Complete Right  Result Date: 02/18/2018 CLINICAL DATA:  Initial evaluation for acute injury, hit head on wall. EXAM: RIGHT HAND - COMPLETE 3+ VIEW COMPARISON:   None. FINDINGS: There is no evidence of fracture or dislocation. There is no evidence of arthropathy or other focal bone abnormality. Mild soft tissue irregularity at the ulnar aspect of the right hand. Additionally, mild soft tissue swelling overlies the metacarpal heads. IMPRESSION: 1. No acute osseus abnormality. 2. Mild soft tissue swelling overlying the metacarpal heads with additional soft tissue irregularity at the ulnar aspect of the hand, which could reflect acute soft tissue injury. No radiopaque foreign body. Electronically Signed   By: Jeannine Boga M.D.   On: 02/18/2018 00:32    Procedures Procedure Name: Intubation Date/Time: 02/17/2018 11:46 PM Performed by: Orson Aloe, MD Pre-anesthesia Checklist: Patient identified Oxygen Delivery Method: Ambu bag Preoxygenation: Pre-oxygenation with 100% oxygen Induction Type: Rapid sequence Ventilation: Mask ventilation without difficulty Laryngoscope Size: Mac and 3 Grade View: Grade I Tube size: 8.0 mm Number of attempts: 1 Airway Equipment and Method: Rigid stylet Placement Confirmation: ETT inserted through vocal cords under direct vision and Positive ETCO2 Secured at: 22 cm Tube secured with: Tape      (including critical care time)  Medications Ordered in ED Medications  ketamine 50 mg in normal saline 5 mL (10 mg/mL) syringe (has no administration in time range)  ketamine HCl 50 MG/5ML SOSY (has no administration in time range)  propofol (DIPRIVAN) 1000 MG/100ML infusion (70 mcg/kg/min  55.5 kg Intravenous Rate/Dose Change 02/18/18 0055)  propofol (DIPRIVAN) 1000 MG/100ML infusion (has no administration in time range)  midazolam (VERSED) injection 4 mg (has no administration in time range)     Initial Impression / Assessment and Plan / ED Course  I have reviewed the triage vital signs and the nursing notes.  Pertinent labs & imaging results that were available during  my care of the patient were reviewed by  me and considered in my medical decision making (see chart for details).        MDM  28 year old male patient with significant past medical history of cardiac past maker due to complete heart block when he was 28 years old, anxiety, asthma, hypertension who presents with police after barricading himself and his significant other in a house after he was banging his head up against a metal door repeatedly.  In addition patient has significant history of recently taking pain medicine after recent tooth extraction which he got a refill today.  Concern for psychiatric psychotic break, overdose, fractures, intracranial hemorrhage and other concerning findings consistent with significant head trauma.  Physical exam as indicated above concerning for facial, cranial injury.  We will also work-up appropriate drug testing as well as salicylate, acetaminophen level.  CT scans will include head, neck, face.  EKG shows nonspecific ST elevations likely secondary due to paced rhythm, does not meet sgarbossa criteria.  Will obtain troponin.  Patient acutely became agitated, making religious references and became extremely altered.  Patient charged the officer here in the emergency department; officer deployed a taser.  Patient was then given ketamine for sedation; but continued to be agitated.  Due to concern for self-harm, worsening possible intracranial injury patient was intubated.  Please see procedure note for more detail.  No complication.  Patient will be given Haldol; and sent to CT scan for appropriate imaging.  Patient found to have blood gas with acidosis, will increase rate.  And repeat CMP due to possible coingestions.  In addition IVC was placed by police department.   Further discussion with police officers indicate that he was placing light bulbs in the oven and doing other bizarre things in the apartment.  In addition patient possibly had stressful conversation with his girlfriend earlier that day which  could have caused a psychotic break leading to the patient's activity and actions.  According to EMS and police patient's family and girlfriend indicate the patient's never had any kind of activity like this prior.  Transfer of care occurred at approximately 1245 oncoming provider.  Plan of care at this time is to wait on returning laboratory studies including CT scans.  The above care was discussed and agreed upon by my attending physician.  Final Clinical Impressions(s) / ED Diagnoses   Final diagnoses:  Traumatic injury of head, initial encounter  Acute psychosis [facility])    ED Discharge Orders    None         Orson Aloe, MD 02/18/18 Ezequiel Ganser    Noemi Chapel, MD 02/19/18 [phone]

## 2018-02-17 NOTE — ED Provider Notes (Signed)
I saw and evaluated the patient, reviewed the resident's note and I agree with the findings and plan.  Pertinent History: 28 year old male, denies any prior history per medical record that he has any significant psychiatric history however after having multiple teeth removed at the dentist office he started to take his pain medications including hydrocodone with acetaminophen.  Evidently the patient then went to get this prescription refilled and took more.  The report from his girlfriend who was barricaded into the apartment with him whom he would not leave states that he was hitting his head against the metal door repeatedly.  When 911 was eventually called and the patient was found in this apartment he had an enormous hematoma covering the entire forehead, he was altered, he was saying the same things over and a repetitive motion and not answering questions appropriately, he seemed distant and detached from reality.  His hair was covered in what appeared to be spaghetti sauce  Pertinent Exam findings: Spaghetti sauce covering the head, large frontal hematoma, this extends down around the periorbital area on the right.  The patient otherwise seems to be moving all 4 extremities, he will able to sit up in bed by himself.  Cardiac exam is unremarkable, he does have a pacemaker in the left upper chest.  There is no signs of trauma to the arms legs or the back.  He has no obvious malocclusion, no obvious bleeding from the dental extraction sites  I was personally present and directly supervised the following procedures:  Trauma and medical resuscitation  Patient will need CT scans of the head cervical spine maxillofacial bones and a work-up for psychiatric cause including acetaminophen overdose testing, salicylate coingestants, EKG.  Unfortunately the patient became more agitated, he started to talk about Jesus and about how he was becoming hyperreligious, he then told the officer that he was going to  "fucking leave" and as he tried to get off the bed a charged at the officer.  The officer in an effort to protect himself and others used his taser to immobilize the patient, he was immobilized back onto the bed at which time he was given ketamine for sedation.  The patient only became sedated for less than 5 minutes before he required a second dose and even after the second dose for a total of 100 mg of ketamine the patient was still coming off the bed saying bizarre things and becoming agitated thrashing around.  It became quite evident that the patient was going to require definitive airway management to facilitate the rest of his work-up so he was given etomidate, rocuronium and intubated.  This was done successfully on first attempt, he will go to CT scan.  The patient will now need the intensive care unit.  .Critical Care Performed by: Eber Hong, MD Authorized by: Eber Hong, MD   Critical care provider statement:    Critical care time (minutes):  35   Critical care time was exclusive of:  Separately billable procedures and treating other patients and teaching time   Critical care was necessary to treat or prevent imminent or life-threatening deterioration of the following conditions:  CNS failure or compromise   Critical care was time spent personally by me on the following activities:  Blood draw for specimens, development of treatment plan with patient or surrogate, discussions with consultants, evaluation of patient's response to treatment, examination of patient, obtaining history from patient or surrogate, ordering and performing treatments and interventions, ordering and review of laboratory  studies, ordering and review of radiographic studies, pulse oximetry, re-evaluation of patient's condition and review of old charts   Procedure supervised:  1.  Intubation 2.  Medical resuscitation  EKG performed on February 17 at 9:46 PM shows a paced rhythm with a rate of 89 bpm,  nonspecific ST findings.   EKG Interpretation  Date/Time:  Monday February 17 2018 21:46:29 EST Ventricular Rate:  89 PR Interval:    QRS Duration: 124 QT Interval:  377 QTC Calculation: 459 R Axis:   -131 Text Interpretation:  Sinus rhythm VENTRICULAR PACED RHYTHM Probable inferior infarct, acute Anterior infarct, old Lateral leads are also involved since last tracing no significant change Confirmed by Eber Hong ([id number]) on 02/17/2018 10:06:23 PM Also confirmed by Eber Hong ([id number]), editor Josephine Igo [phone])  on 02/18/2018 7:32:44 AM        I personally interpreted the EKG as well as the resident and agree with the interpretation on the resident's chart.  Final diagnoses:  Traumatic injury of head, initial encounter  Acute psychosis (HCC)  Respiratory acidosis  Swallowed foreign body, initial encounter  Closed fracture of left side of maxilla, initial encounter (HCC)       Eber Hong, MD 02/19/18 [phone]

## 2018-02-18 ENCOUNTER — Inpatient Hospital Stay (HOSPITAL_COMMUNITY): Payer: Medicaid Other

## 2018-02-18 ENCOUNTER — Emergency Department (HOSPITAL_COMMUNITY): Payer: Medicaid Other

## 2018-02-18 ENCOUNTER — Encounter (HOSPITAL_COMMUNITY)
Admission: EM | Disposition: A | Discharge status: Other Acute Inpatient Hospital | Payer: Self-pay | Attending: Internal Medicine

## 2018-02-18 ENCOUNTER — Encounter (HOSPITAL_COMMUNITY): Payer: Self-pay | Admitting: Radiology

## 2018-02-18 DIAGNOSIS — E872 Acidosis: Secondary | ICD-10-CM | POA: Diagnosis present

## 2018-02-18 DIAGNOSIS — R0689 Other abnormalities of breathing: Secondary | ICD-10-CM | POA: Insufficient documentation

## 2018-02-18 DIAGNOSIS — R410 Disorientation, unspecified: Secondary | ICD-10-CM | POA: Diagnosis present

## 2018-02-18 DIAGNOSIS — Q246 Congenital heart block: Secondary | ICD-10-CM | POA: Diagnosis not present

## 2018-02-18 DIAGNOSIS — F909 Attention-deficit hyperactivity disorder, unspecified type: Secondary | ICD-10-CM | POA: Diagnosis present

## 2018-02-18 DIAGNOSIS — J45909 Unspecified asthma, uncomplicated: Secondary | ICD-10-CM | POA: Diagnosis present

## 2018-02-18 DIAGNOSIS — S0240DA Maxillary fracture, left side, initial encounter for closed fracture: Secondary | ICD-10-CM | POA: Diagnosis not present

## 2018-02-18 DIAGNOSIS — I10 Essential (primary) hypertension: Secondary | ICD-10-CM | POA: Diagnosis present

## 2018-02-18 DIAGNOSIS — E162 Hypoglycemia, unspecified: Secondary | ICD-10-CM | POA: Diagnosis present

## 2018-02-18 DIAGNOSIS — Y92039 Unspecified place in apartment as the place of occurrence of the external cause: Secondary | ICD-10-CM | POA: Diagnosis not present

## 2018-02-18 DIAGNOSIS — N179 Acute kidney failure, unspecified: Secondary | ICD-10-CM | POA: Diagnosis present

## 2018-02-18 DIAGNOSIS — T182XXA Foreign body in stomach, initial encounter: Secondary | ICD-10-CM | POA: Diagnosis not present

## 2018-02-18 DIAGNOSIS — G8929 Other chronic pain: Secondary | ICD-10-CM | POA: Diagnosis present

## 2018-02-18 DIAGNOSIS — J9811 Atelectasis: Secondary | ICD-10-CM | POA: Diagnosis present

## 2018-02-18 DIAGNOSIS — S7011XA Contusion of right thigh, initial encounter: Secondary | ICD-10-CM | POA: Diagnosis present

## 2018-02-18 DIAGNOSIS — F23 Brief psychotic disorder: Secondary | ICD-10-CM | POA: Diagnosis present

## 2018-02-18 DIAGNOSIS — T189XXD Foreign body of alimentary tract, part unspecified, subsequent encounter: Secondary | ICD-10-CM | POA: Diagnosis not present

## 2018-02-18 DIAGNOSIS — S0003XA Contusion of scalp, initial encounter: Secondary | ICD-10-CM | POA: Diagnosis present

## 2018-02-18 DIAGNOSIS — T189XXA Foreign body of alimentary tract, part unspecified, initial encounter: Secondary | ICD-10-CM | POA: Diagnosis not present

## 2018-02-18 DIAGNOSIS — M5430 Sciatica, unspecified side: Secondary | ICD-10-CM | POA: Diagnosis present

## 2018-02-18 DIAGNOSIS — J9621 Acute and chronic respiratory failure with hypoxia: Secondary | ICD-10-CM | POA: Diagnosis not present

## 2018-02-18 DIAGNOSIS — K210 Gastro-esophageal reflux disease with esophagitis: Secondary | ICD-10-CM | POA: Diagnosis present

## 2018-02-18 DIAGNOSIS — S0990XA Unspecified injury of head, initial encounter: Secondary | ICD-10-CM | POA: Insufficient documentation

## 2018-02-18 DIAGNOSIS — F209 Schizophrenia, unspecified: Secondary | ICD-10-CM | POA: Diagnosis present

## 2018-02-18 DIAGNOSIS — M4802 Spinal stenosis, cervical region: Secondary | ICD-10-CM | POA: Diagnosis present

## 2018-02-18 DIAGNOSIS — J9601 Acute respiratory failure with hypoxia: Secondary | ICD-10-CM | POA: Diagnosis present

## 2018-02-18 DIAGNOSIS — W2201XA Walked into wall, initial encounter: Secondary | ICD-10-CM | POA: Diagnosis present

## 2018-02-18 DIAGNOSIS — F250 Schizoaffective disorder, bipolar type: Secondary | ICD-10-CM | POA: Diagnosis not present

## 2018-02-18 DIAGNOSIS — I429 Cardiomyopathy, unspecified: Secondary | ICD-10-CM | POA: Diagnosis present

## 2018-02-18 HISTORY — PX: ESOPHAGOGASTRODUODENOSCOPY: SHX5428

## 2018-02-18 HISTORY — PX: FOREIGN BODY REMOVAL: SHX962

## 2018-02-18 LAB — CBC
HCT: 39.7 % (ref 39.0–52.0)
Hemoglobin: 13.3 g/dL (ref 13.0–17.0)
MCH: 30 pg (ref 26.0–34.0)
MCHC: 33.5 g/dL (ref 30.0–36.0)
MCV: 89.6 fL (ref 80.0–100.0)
Platelets: 234 10*3/uL (ref 150–400)
RBC: 4.43 MIL/uL (ref 4.22–5.81)
RDW: 12.5 % (ref 11.5–15.5)
WBC: 9.1 10*3/uL (ref 4.0–10.5)
nRBC: 0 % (ref 0.0–0.2)

## 2018-02-18 LAB — POCT I-STAT 7, (LYTES, BLD GAS, ICA,H+H)
Acid-base deficit: 10 mmol/L — ABNORMAL HIGH (ref 0.0–2.0)
Acid-base deficit: 2 mmol/L (ref 0.0–2.0)
Acid-base deficit: 2 mmol/L (ref 0.0–2.0)
Bicarbonate: 21.4 mmol/L (ref 20.0–28.0)
Bicarbonate: 22.6 mmol/L (ref 20.0–28.0)
Bicarbonate: 24.2 mmol/L (ref 20.0–28.0)
Calcium, Ion: 1.18 mmol/L (ref 1.15–1.40)
Calcium, Ion: 1.18 mmol/L (ref 1.15–1.40)
Calcium, Ion: 1.24 mmol/L (ref 1.15–1.40)
HCT: 36 % — ABNORMAL LOW (ref 39.0–52.0)
HCT: 39 % (ref 39.0–52.0)
HCT: 40 % (ref 39.0–52.0)
HEMOGLOBIN: 12.2 g/dL — AB (ref 13.0–17.0)
Hemoglobin: 13.3 g/dL (ref 13.0–17.0)
Hemoglobin: 13.6 g/dL (ref 13.0–17.0)
O2 SAT: 99 %
O2 Saturation: 100 %
O2 Saturation: 98 %
POTASSIUM: 4 mmol/L (ref 3.5–5.1)
Patient temperature: 97.4
Patient temperature: 97.9
Patient temperature: 99.3
Potassium: 3.8 mmol/L (ref 3.5–5.1)
Potassium: 3.9 mmol/L (ref 3.5–5.1)
SODIUM: 138 mmol/L (ref 135–145)
Sodium: 139 mmol/L (ref 135–145)
Sodium: 139 mmol/L (ref 135–145)
TCO2: 24 mmol/L (ref 22–32)
TCO2: 24 mmol/L (ref 22–32)
TCO2: 26 mmol/L (ref 22–32)
pCO2 arterial: 36.9 mmHg (ref 32.0–48.0)
pCO2 arterial: 42.9 mmHg (ref 32.0–48.0)
pCO2 arterial: 77.6 mmHg (ref 32.0–48.0)
pH, Arterial: 7.05 — CL (ref 7.350–7.450)
pH, Arterial: 7.356 (ref 7.350–7.450)
pH, Arterial: 7.393 (ref 7.350–7.450)
pO2, Arterial: 204 mmHg — ABNORMAL HIGH (ref 83.0–108.0)
pO2, Arterial: 207 mmHg — ABNORMAL HIGH (ref 83.0–108.0)
pO2, Arterial: 96 mmHg (ref 83.0–108.0)

## 2018-02-18 LAB — BASIC METABOLIC PANEL
Anion gap: 11 (ref 5–15)
BUN: 15 mg/dL (ref 6–20)
CO2: 21 mmol/L — ABNORMAL LOW (ref 22–32)
Calcium: 8.7 mg/dL — ABNORMAL LOW (ref 8.9–10.3)
Chloride: 106 mmol/L (ref 98–111)
Creatinine, Ser: 1.35 mg/dL — ABNORMAL HIGH (ref 0.61–1.24)
GFR calc Af Amer: >60 mL/min (ref >60)
GFR calc non Af Amer: >60 mL/min (ref >60)
Glucose, Bld: 90 mg/dL (ref 70–99)
Potassium: 4.3 mmol/L (ref 3.5–5.1)
Sodium: 138 mmol/L (ref 135–145)

## 2018-02-18 LAB — COMPREHENSIVE METABOLIC PANEL
ALT: 26 U/L (ref 0–44)
AST: 62 U/L — ABNORMAL HIGH (ref 15–41)
Albumin: 3.5 g/dL (ref 3.5–5.0)
Alkaline Phosphatase: 51 U/L (ref 38–126)
Anion gap: 8 (ref 5–15)
BUN: 14 mg/dL (ref 6–20)
CO2: 24 mmol/L (ref 22–32)
CREATININE: 1.22 mg/dL (ref 0.61–1.24)
Calcium: 8.4 mg/dL — ABNORMAL LOW (ref 8.9–10.3)
Chloride: 106 mmol/L (ref 98–111)
GFR calc Af Amer: >60 mL/min (ref >60)
GFR calc non Af Amer: >60 mL/min (ref >60)
Glucose, Bld: 109 mg/dL — ABNORMAL HIGH (ref 70–99)
Potassium: 3.9 mmol/L (ref 3.5–5.1)
Sodium: 138 mmol/L (ref 135–145)
Total Bilirubin: 1.2 mg/dL (ref 0.3–1.2)
Total Protein: 6.5 g/dL (ref 6.5–8.1)

## 2018-02-18 LAB — GLUCOSE, CAPILLARY
GLUCOSE-CAPILLARY: 67 mg/dL — AB (ref 70–99)
Glucose-Capillary: 188 mg/dL — ABNORMAL HIGH (ref 70–99)
Glucose-Capillary: 67 mg/dL — ABNORMAL LOW (ref 70–99)
Glucose-Capillary: 67 mg/dL — ABNORMAL LOW (ref 70–99)
Glucose-Capillary: 72 mg/dL (ref 70–99)
Glucose-Capillary: 75 mg/dL (ref 70–99)
Glucose-Capillary: 89 mg/dL (ref 70–99)

## 2018-02-18 LAB — RAPID URINE DRUG SCREEN, HOSP PERFORMED
AMPHETAMINES: NOT DETECTED
BENZODIAZEPINES: POSITIVE — AB
Barbiturates: NOT DETECTED
Cocaine: NOT DETECTED
OPIATES: NOT DETECTED
Tetrahydrocannabinol: NOT DETECTED

## 2018-02-18 LAB — PHOSPHORUS: Phosphorus: 5 mg/dL — ABNORMAL HIGH (ref 2.5–4.6)

## 2018-02-18 LAB — RAPID HIV SCREEN (HIV 1/2 AB+AG)
HIV 1/2 ANTIBODIES: NONREACTIVE
HIV-1 P24 Antigen - HIV24: NONREACTIVE

## 2018-02-18 LAB — TRIGLYCERIDES: Triglycerides: 100 mg/dL (ref <150)

## 2018-02-18 LAB — MRSA PCR SCREENING: MRSA by PCR: NEGATIVE

## 2018-02-18 LAB — MAGNESIUM: Magnesium: 2.7 mg/dL — ABNORMAL HIGH (ref 1.7–2.4)

## 2018-02-18 LAB — OSMOLALITY: Osmolality: 286 mOsm/kg (ref 275–295)

## 2018-02-18 SURGERY — EGD (ESOPHAGOGASTRODUODENOSCOPY)
Anesthesia: Moderate Sedation

## 2018-02-18 MED ORDER — DEXTROSE IN LACTATED RINGERS 5 % IV SOLN
INTRAVENOUS | Status: DC
  Administered 2018-02-18: 11:00:00 via INTRAVENOUS

## 2018-02-18 MED ORDER — ALBUTEROL SULFATE (2.5 MG/3ML) 0.083% IN NEBU
2.5000 mg | INHALATION_SOLUTION | RESPIRATORY_TRACT | Status: DC | PRN
  Administered 2018-02-22: 2.5 mg via RESPIRATORY_TRACT
  Filled 2018-02-18: qty 3

## 2018-02-18 MED ORDER — LACTATED RINGERS IV SOLN
INTRAVENOUS | Status: DC
  Administered 2018-02-18: 08:00:00 via INTRAVENOUS

## 2018-02-18 MED ORDER — DEXTROSE 50 % IV SOLN
12.5000 g | INTRAVENOUS | Status: AC
  Administered 2018-02-18: 12.5 g via INTRAVENOUS

## 2018-02-18 MED ORDER — DIPHENHYDRAMINE HCL 50 MG/ML IJ SOLN
INTRAMUSCULAR | Status: DC | PRN
  Administered 2018-02-18 (×2): 25 mg via INTRAVENOUS

## 2018-02-18 MED ORDER — MIDAZOLAM HCL 2 MG/2ML IJ SOLN
4.0000 mg | Freq: Once | INTRAMUSCULAR | Status: AC
  Administered 2018-02-18: 4 mg via INTRAVENOUS

## 2018-02-18 MED ORDER — MIDAZOLAM HCL (PF) 10 MG/2ML IJ SOLN
INTRAMUSCULAR | Status: DC | PRN
  Administered 2018-02-18 (×2): 2 mg via INTRAVENOUS

## 2018-02-18 MED ORDER — MIDAZOLAM HCL (PF) 5 MG/ML IJ SOLN
INTRAMUSCULAR | Status: AC
  Filled 2018-02-18: qty 1

## 2018-02-18 MED ORDER — FENTANYL CITRATE (PF) 100 MCG/2ML IJ SOLN
INTRAMUSCULAR | Status: DC | PRN
  Administered 2018-02-18 (×2): 25 ug via INTRAVENOUS

## 2018-02-18 MED ORDER — PROPOFOL 1000 MG/100ML IV EMUL
INTRAVENOUS | Status: AC
  Filled 2018-02-18: qty 100

## 2018-02-18 MED ORDER — DEXTROSE 50 % IV SOLN
INTRAVENOUS | Status: AC
  Filled 2018-02-18: qty 50

## 2018-02-18 MED ORDER — MIDAZOLAM HCL 2 MG/2ML IJ SOLN: 2.0000 mg | INTRAMUSCULAR | Status: DC | PRN

## 2018-02-18 MED ORDER — DEXMEDETOMIDINE HCL IN NACL 400 MCG/100ML IV SOLN
0.4000 ug/kg/h | INTRAVENOUS | Status: DC
  Administered 2018-02-18: 0.4 ug/kg/h via INTRAVENOUS
  Administered 2018-02-19: 1.1 ug/kg/h via INTRAVENOUS
  Filled 2018-02-18 (×2): qty 100

## 2018-02-18 MED ORDER — DIPHENHYDRAMINE HCL 50 MG/ML IJ SOLN
INTRAMUSCULAR | Status: AC
  Filled 2018-02-18: qty 1

## 2018-02-18 MED ORDER — FENTANYL CITRATE (PF) 100 MCG/2ML IJ SOLN
25.0000 ug | INTRAMUSCULAR | Status: DC | PRN
  Administered 2018-02-18 (×2): 100 ug via INTRAVENOUS
  Administered 2018-02-20: 25 ug via INTRAVENOUS
  Filled 2018-02-18 (×4): qty 2

## 2018-02-18 MED ORDER — ORAL CARE MOUTH RINSE
15.0000 mL | OROMUCOSAL | Status: DC
  Administered 2018-02-18 – 2018-02-19 (×5): 15 mL via OROMUCOSAL

## 2018-02-18 MED ORDER — FENTANYL CITRATE (PF) 100 MCG/2ML IJ SOLN
INTRAMUSCULAR | Status: AC
  Filled 2018-02-18: qty 2

## 2018-02-18 MED ORDER — MIDAZOLAM HCL 2 MG/2ML IJ SOLN
INTRAMUSCULAR | Status: AC
  Filled 2018-02-18: qty 4

## 2018-02-18 MED ORDER — CHLORHEXIDINE GLUCONATE 0.12% ORAL RINSE (MEDLINE KIT)
15.0000 mL | Freq: Two times a day (BID) | OROMUCOSAL | Status: DC
  Administered 2018-02-18 – 2018-02-24 (×9): 15 mL via OROMUCOSAL

## 2018-02-18 MED ORDER — DEXTROSE 10 % IV SOLN
INTRAVENOUS | Status: DC
  Administered 2018-02-18 (×2): via INTRAVENOUS

## 2018-02-18 MED ORDER — LACTATED RINGERS IV SOLN
INTRAVENOUS | Status: DC
  Administered 2018-02-18 (×2): via INTRAVENOUS

## 2018-02-18 MED ORDER — POLYETHYLENE GLYCOL 3350 17 G PO PACK: 17.0000 g | PACK | Freq: Every day | ORAL | Status: DC | PRN

## 2018-02-18 NOTE — Progress Notes (Addendum)
                                                                           [facility] Consult: 10:56 AM 02/18/2018  LOS: 0 days    Referring Provider: Dr McQuaid Primary Care Physician:  Boyd, Tammy Lamonica, MD [facility] provider. Primary Gastroenterologist: unassigned    Reason for Consultation: Ingestion of foreign body.   HPI: Mario Wyatt is a 27 y.o. male.  PMH asthma.  Genital heart block, status post pacemaker initially placed age 6.  Cardiomyopathy.  Sciatica, chronic and level back pain.   Psychosis in 03/2011 at which time he was having auditory hallucinations.  Discharge from behavioral health the diagnosis was anxiety, ADHD, delusional disorder.  Positive RPR in 03/2011, infectious disease felt this was a false positive and treatment was not required.  Last week he underwent dental work and has been taking tramadol for pain management since then. 2/17 patient barricaded himself in the apartment, his mother called the police for a wellness check.  His girlfriend was at home.  Once the door was open they found him banging his head against the door.  The sink and tub drains have been stopped up and were overflowing.  Prior to this he had been heating up light bulbs in the open until they broke. Due to extreme agitation the police tasered him.  He continued agitation, combative behavior despite ketamine.  He was intubated and sedated for safety and airway protection.  Studies confirm soft tissue injury to both hand KUB confirms what look like metallic key and rectangular electronic object in the proximal stomach, possibly reflecting ingested foreign bodies.  Bowel gas pattern nonobstructive. CT abdomen pelvis shows streaking metallic artifacts in the region of the stomach consistent with FB ingestion.  No free air.  In the right lung base there is atelectasis  versus pneumonia. Neck CT shows bulging disc Head CT confirms maxillary fracture on the left, hematomas on the scalp and forehead  Tox screen positive for benzodiazepines Has mild renal insufficiency but normal GFR. AST 62, otherwise LFTs normal.  Ammonia level not assayed. No anemia  Today he remains intubated and sedated on the vent.  Past Medical History:  Diagnosis Date  . Anxiety   . Arthritis    "wrists, knees; probably all over my body" (04/05/2017)  . Asthma   . Attention deficit hyperactivity disorder (ADHD)    pt denies this hx on 04/05/2017  . Cardiomyopathy (HCC)   . Chronic back pain    "right neck to lower back; shoulders" (04/05/2017)  . Congenital complete AV block   . Daily headache   . GERD (gastroesophageal reflux disease)   . Macular degeneration    bilateral  . Migraine    "a couple/month" (04/05/2017)  . Pacemaker    Placed when pt was 28 years old.  . S/P cardiac pacemaker procedure, 09/08/14, [employer] 09/09/2014  . Wears glasses     Past Surgical History:  Procedure Laterality Date  . CARDIAC PACEMAKER PLACEMENT  2007   second degree heart block  . EP IMPLANTABLE DEVICE N/A 09/08/2014   Procedure: Pacemaker Implant;  Surgeon: Steven C Klein, MD;  Location: [facility]   INVASIVE CV LAB;  Service: Cardiovascular;  Laterality: N/A;  . HERNIA REPAIR    . PACEMAKER INSERTION  1999  . PACEMAKER LEAD REMOVAL N/A 04/04/2017   Procedure: LEAD EXTRACTION AND NEW LEAD PLACEMENT AND CHANGE OUT OF OLD PACEMAKER;  Surgeon: Taylor, Gregg W, MD;  Location: [facility] OR;  Service: Cardiovascular;  Laterality: N/A;  . PERMANENT PACEMAKER GENERATOR CHANGE  04/04/2017   LEAD EXTRACTION AND NEW LEAD PLACEMENT AND CHANGE OUT OF OLD PACEMAKER  . UMBILICAL HERNIA REPAIR     "when I was a child"    Prior to Admission medications   Medication Sig Start Date End Date Taking? Authorizing Provider  acetaminophen (TYLENOL) 325 MG tablet Take 1-2 tablets (325-650 mg total) by mouth every 4 (four)  hours as needed for mild pain. 04/05/17   Kilroy, Luke K, PA-C  albuterol (PROVENTIL HFA;VENTOLIN HFA) 108 (90 BASE) MCG/ACT inhaler Inhale 2 puffs into the lungs every 4 (four) hours as needed for wheezing or shortness of breath. 04/11/14   Knapp, Iva, MD  carvedilol (COREG) 3.125 MG tablet Take 1 tablet (3.125 mg total) by mouth 2 (two) times daily. Please make yearly appt with Dr.Klein for February for future refill1st attempt 11/12/17 03/12/18  Klein, Steven C, MD  EPINEPHrine (EPIPEN 2-PAK) 0.3 mg/0.3 mL IJ SOAJ injection Inject 0.3 mLs (0.3 mg total) into the muscle once. 04/11/14   Knapp, Iva, MD  ibuprofen (ADVIL,MOTRIN) 200 MG tablet Take 200 mg by mouth every 6 (six) hours as needed for mild pain or moderate pain.    [provider]  lisinopril (PRINIVIL,ZESTRIL) 5 MG tablet Take 1 tablet (5 mg total) by mouth daily. Please make yearly appt with Dr. Klein for February for future refills. 1st attempt 11/12/17   Klein, Steven C, MD  traMADol (ULTRAM) 50 MG tablet Take 1 tablet (50 mg total) by mouth every 6 (six) hours as needed. 12/01/17   Bast, Traci A, NP    Scheduled Meds:  Infusions: . dextrose 5% lactated ringers 100 mL/hr at 02/18/18 1044  . propofol (DIPRIVAN) infusion 80 mcg/kg/min (02/18/18 0731)   PRN Meds: albuterol, midazolam, midazolam   Allergies as of 02/17/2018 - Review Complete 02/06/2018  Allergen Reaction Noted  . Iodine Anaphylaxis 04/21/2014  . Shellfish allergy Anaphylaxis 01/04/2011  . Latex Hives and Rash 09/02/2014    Family History  Problem Relation Age of Onset  . Hypertension Mother   . Diabetes Mother   . Bipolar disorder Mother   . Drug abuse Mother   . Glaucoma Father   . Anesthesia problems Neg Hx   . Hypotension Neg Hx   . Malignant hyperthermia Neg Hx   . Pseudochol deficiency Neg Hx     Social History   Socioeconomic History  . Marital status: Single    Spouse name: Not on file  . Number of children: Not on file  . Years of  education: Not on file  . Highest education level: Not on file  Occupational History  . Not on file  Social Needs  . Financial resource strain: Not on file  . Food insecurity:    Worry: Not on file    Inability: Not on file  . Transportation needs:    Medical: Not on file    Non-medical: Not on file  Tobacco Use  . Smoking status: Former Smoker    Packs/day: 1.00    Years: 10.00    Pack years: 10.00    Types: Cigarettes    Last attempt   to quit: 10/01/2013    Years since quitting: 4.3  . Smokeless tobacco: Never Used  Substance and Sexual Activity  . Alcohol use: Not Currently  . Drug use: No  . Sexual activity: Yes  Lifestyle  . Physical activity:    Days per week: Not on file    Minutes per session: Not on file  . Stress: Not on file  Relationships  . Social connections:    Talks on phone: Not on file    Gets together: Not on file    Attends religious service: Not on file    Active member of club or organization: Not on file    Attends meetings of clubs or organizations: Not on file    Relationship status: Not on file  . Intimate partner violence:    Fear of current or ex partner: Not on file    Emotionally abused: Not on file    Physically abused: Not on file    Forced sexual activity: Not on file  Other Topics Concern  . Not on file  Social History Narrative  . Not on file    REVIEW OF SYSTEMS: Unable to screen for review of symptoms as the patient is sedated on the vent.     PHYSICAL EXAM: Vital signs in last 24 hours: Vitals:   02/18/18 1015 02/18/18 1030  BP: (!) 132/109 (!) 135/107  Pulse: 85 76  Resp: (!) 25 (!) 26  Temp:    SpO2: 100% 99%   Wt Readings from Last 3 Encounters:  02/17/18 55.5 kg  02/06/18 55.5 kg  04/05/17 57.4 kg    General: Patient has a swollen, bruised facial appearance.  He is intubated on the vent. Head: Abrasions, bruising, swelling of the face and lips. Eyes: No conjunctival pallor. Ears: Unable to assess  hearing Nose: No congestion or discharge Mouth: Swollen lips, intubated.  Did not open the oral cavity to examine. Neck: No JVD, no masses. Lungs: Nonlabored, clear breathing on anterior auscultation. Heart: RRR.  No MRG.  S1, S2 present Abdomen: Soft.  Not distended.  Bowel sounds hypoactive but no tinkling or tympanitic bowel sounds.  No masses, no HSM.  Unable to elicit tenderness.  There is an oval scar in the region of the umbilicus which is to have been surgically removed Rectal: Not performed Musc/Skeltl: Hands are covered with soft safety mittens which were not removed for examination.  There are some scratches on the wrists Extremities: No CCE. Neurologic: Sedated on vent Tattoos: None observed but did not examine his backside.   Intake/Output from previous day: 02/17 0701 - 02/18 0700 In: 42 [I.V.:42] Out: -  Intake/Output this shift: Total I/O In: 147.1 [I.V.:147.1] Out: 1000 [Urine:1000]  LAB RESULTS: Recent Labs    02/17/18 2218  02/18/18 0358 02/18/18 0426 02/18/18 1052  WBC 9.3  --   --  9.1  --   HGB 14.5   < > 12.2* 13.3 13.3  HCT 43.2   < > 36.0* 39.7 39.0  PLT 259  --   --  234  --    < > = values in this interval not displayed.   BMET Lab Results  Component Value Date   NA 139 02/18/2018   NA 138 02/18/2018   NA 138 02/18/2018   K 3.8 02/18/2018   K 4.3 02/18/2018   K 4.0 02/18/2018   CL 106 02/18/2018   CL 106 02/18/2018   CL 101 02/17/2018   CO2 21 (L) 02/18/2018     CO2 24 02/18/2018   CO2 23 02/17/2018   GLUCOSE 90 02/18/2018   GLUCOSE 109 (H) 02/18/2018   GLUCOSE 80 02/17/2018   BUN 15 02/18/2018   BUN 14 02/18/2018   BUN 14 02/17/2018   CREATININE 1.35 (H) 02/18/2018   CREATININE 1.22 02/18/2018   CREATININE 1.28 (H) 02/17/2018   CALCIUM 8.7 (L) 02/18/2018   CALCIUM 8.4 (L) 02/18/2018   CALCIUM 9.6 02/17/2018   LFT Recent Labs    02/17/18 2218 02/18/18 0136  PROT 7.3 6.5  ALBUMIN 4.2 3.5  AST 62* 62*  ALT 26 26  ALKPHOS  56 51  BILITOT 1.2 1.2   PT/INR Lab Results  Component Value Date   INR 1.01 10/25/2009   Hepatitis Panel No results for input(s): HEPBSAG, HCVAB, HEPAIGM, HEPBIGM in the last 72 hours. C-Diff No components found for: CDIFF Lipase     Component Value Date/Time   LIPASE 27 08/12/2015 1500    Drugs of Abuse     Component Value Date/Time   LABOPIA NONE DETECTED 02/18/2018 0824   COCAINSCRNUR NONE DETECTED 02/18/2018 0824   LABBENZ POSITIVE (A) 02/18/2018 0824   AMPHETMU NONE DETECTED 02/18/2018 0824   THCU NONE DETECTED 02/18/2018 0824   LABBARB NONE DETECTED 02/18/2018 0824     RADIOLOGY STUDIES: Ct Abdomen Pelvis Wo Contrast  Result Date: 02/18/2018 CLINICAL DATA:  Foreign body ingestion (keys) as seen on abdomen radiographs EXAM: CT ABDOMEN AND PELVIS WITHOUT CONTRAST TECHNIQUE: Multidetector CT imaging of the abdomen and pelvis was performed following the standard protocol without IV contrast. COMPARISON:  Same day abdomen radiographs FINDINGS: Lower chest: Pulmonary consolidation at the right lung base with air bronchograms. Right basilar atelectasis and/or pneumonia suspected. The left lung is clear. Hepatobiliary: No focal liver abnormality is seen. No gallstones, gallbladder wall thickening, or biliary dilatation. Pancreas: Unremarkable. No pancreatic ductal dilatation or surrounding inflammatory changes. Spleen: Normal in size without focal abnormality. Adrenals/Urinary Tract: The left adrenal gland and upper pole of kidney are obscured by metallic streak artifacts from the patient's foreign body ingestion. No obstructive uropathy or nephrolithiasis. The urinary bladder is unremarkable. Stomach/Bowel: Metallic streak artifacts consistent with patient's foreign body ingestion is identified along the posterior wall of the expected stomach. Gastric tube extends into the stomach. No bowel obstruction. Paucity of mesenteric fat limits further assessment. Vascular/Lymphatic: No  significant vascular findings are present. No enlarged abdominal or pelvic lymph nodes. Reproductive: Prostate is unremarkable. Other: No abdominal wall hernia or abnormality. No abdominopelvic ascites. Musculoskeletal: No acute or significant osseous findings. IMPRESSION: 1. Pulmonary consolidation at the right lung base with air bronchograms. Right basilar atelectasis and/or pneumonia suspected. 2. Metallic streak artifacts emanating from the expected location of the stomach consistent with foreign body ingestion. No free air. Electronically Signed   By: David  Kwon M.D.   On: 02/18/2018 02:15   Ct Head Wo Contrast  Result Date: 02/18/2018 CLINICAL DATA:  Patient presents after having barricaded himself in his apartment and hitting his head against a wall multiple times. EXAM: CT HEAD WITHOUT CONTRAST TECHNIQUE: Contiguous axial images were obtained from the base of the skull through the vertex without intravenous contrast. COMPARISON:  None. FINDINGS: Brain: No acute intracranial hemorrhage, midline shift or edema. No hydrocephalus. No extra-axial fluid collections. Midline fourth ventricle basal cisterns without effacement. Gray-white matter distinction is maintained. Vascular: No hyperdense vessel sign Skull: Fracture of the left lateral wall of the maxillary sinus extending into the posterior left maxilla. Asymmetry of the maxilla with bony   deficiency along the posterior aspect of the right maxilla relative to left possibly related to old remote trauma or dental disease and extractions. Sinuses/Orbits: Moderate mucosal thickening of the maxillary sinuses with air-fluid level on the right. Soft tissue densities in the left nasal passage with ethmoid sinus mucosal thickening. The sphenoid sinus is clear. The frontal sinus is not pneumatized. Other: Hematoma of the forehead with moderate scalp and forehead soft tissue swelling noted. Hypodensity at the base of the tongue is again noted possibly a thyroglossal  duct cyst. IMPRESSION: 1. No acute intracranial abnormality. 2. Fracture of the left lateral wall of the maxillary sinus extending into the posterior left maxilla. Bony deficiency along the posterior aspect of the right maxilla relative to left possibly related to old trauma, dental disease and/or extractions. 3. Hematoma of the forehead with moderate scalp and forehead soft tissue swelling. Electronically Signed   By: David  Kwon M.D.   On: 02/18/2018 02:09   Ct Cervical Spine Wo Contrast  Result Date: 02/18/2018 CLINICAL DATA:  Recent tooth extractions for which the patient typical out of pain meds. Patient began banging his head against a wall multiple times and presents with bruising beneath both eyes and swelling of the forehead. EXAM: CT CERVICAL SPINE WITHOUT CONTRAST TECHNIQUE: Multidetector CT imaging of the cervical spine was performed without intravenous contrast. Multiplanar CT image reconstructions were also generated. COMPARISON:  07/30/2016 FINDINGS: Alignment: Maintained cervical lordosis Skull base and vertebrae: Intact Soft tissues and spinal canal: The patient is intubated. A gastric tube is seen coiled in the oropharynx before projecting caudad into the esophagus. The tip is excluded on this study. Disc levels: C5-6 central to right central disc bulge impressing upon the thecal sac. Mild central canal stenosis at this level. No jumped or perched facets. No significant foraminal encroachment. Upper chest: Negative Other: Redemonstration of a cyst at the base of the tongue possibly representing a thyroglossal duct cyst. This measures at least 2.5 cm on the sagittal view. IMPRESSION: 1. C5-6 central to right central disc bulge impressing upon the thecal sac. Mild central canal stenosis at this level. 2. The patient's gastric tube is coiled in the oropharynx before projecting caudad into the esophagus. The tip is excluded on this study. 3. No acute cervical spine fracture. Electronically Signed    By: David  Kwon M.D.   On: 02/18/2018 02:27   Dg Hand 2 View Left  Result Date: 02/18/2018 CLINICAL DATA:  Initial evaluation for acute injury, hit head on wall, pain. EXAM: LEFT HAND - 2 VIEW COMPARISON:  None. FINDINGS: Single oblique view of the left hand demonstrates no acute fracture or dislocation. Joint spaces maintained. Soft tissue irregularity at the ulnar aspect of the mid left hand, suggesting small soft tissue injury. No radiopaque foreign body. IMPRESSION: 1. No acute osseous abnormality. 2. Focal soft tissue irregularity at the ulnar aspect of the left hand, suggesting soft tissue injury. No radiopaque foreign body. Electronically Signed   By: Benjamin  McClintock M.D.   On: 02/18/2018 00:37   Dg Chest Portable 1 View  Result Date: 02/18/2018 CLINICAL DATA:  Initial evaluation for acute injury, struck head on wall. EXAM: PORTABLE CHEST 1 VIEW COMPARISON:  Prior radiograph from 04/05/2017 FINDINGS: Endotracheal tube in place with tip positioned 5 cm above the carina. Enteric tube courses into the abdomen. Left-sided pacemaker/AICD in place. Cardiac and mediastinal silhouettes are stable, and remain within normal limits. Lungs mildly hypoinflated. No focal infiltrates. No edema or effusion. No pneumothorax. Visualized osseous   structures within normal limits. IMPRESSION: 1. Tip of endotracheal tube position 5 cm above the carina. 2. Enteric tube courses into the upper abdomen, nonvisualization of tip or side hole. 3. Clear lungs.  No other active cardiopulmonary disease. Electronically Signed   By: Benjamin  McClintock M.D.   On: 02/18/2018 00:09   Dg Abd Portable 1v  Result Date: 02/18/2018 CLINICAL DATA:  Initial evaluation for foreign body. EXAM: PORTABLE ABDOMEN - 1 VIEW COMPARISON:  None. FINDINGS: Enteric tube in place with tip overlying the gastric body, side hole just beyond the GE junction. Bowel gas pattern within normal limits. Moderate retained stool within the colon. Metallic  keys overlie the proximal stomach, suggestive of foreign body. Few adjacent subcentimeter linear densities. Additional adjacent rectangular electronic density, which also may reflect a foreign body. IMPRESSION: 1. Metallic keys and rectangular electronic object overlying the proximal stomach, which could reflect ingested foreign bodies. 2. Enteric tube in place with tip overlying the gastric body, side hole beyond the GE junction. 3. Nonobstructive bowel gas pattern. Electronically Signed   By: Benjamin  McClintock M.D.   On: 02/18/2018 00:42   Dg Hand Complete Right  Result Date: 02/18/2018 CLINICAL DATA:  Initial evaluation for acute injury, hit head on wall. EXAM: RIGHT HAND - COMPLETE 3+ VIEW COMPARISON:  None. FINDINGS: There is no evidence of fracture or dislocation. There is no evidence of arthropathy or other focal bone abnormality. Mild soft tissue irregularity at the ulnar aspect of the right hand. Additionally, mild soft tissue swelling overlies the metacarpal heads. IMPRESSION: 1. No acute osseus abnormality. 2. Mild soft tissue swelling overlying the metacarpal heads with additional soft tissue irregularity at the ulnar aspect of the hand, which could reflect acute soft tissue injury. No radiopaque foreign body. Electronically Signed   By: Benjamin  McClintock M.D.   On: 02/18/2018 00:32   Ct Maxillofacial Wo Contrast  Result Date: 02/18/2018 CLINICAL DATA:  Patient dental work a few days ago and has taken lot of pain meds. Swelling of the forehead with bruising under both eyes. EXAM: CT MAXILLOFACIAL WITHOUT CONTRAST TECHNIQUE: Multidetector CT imaging of the maxillofacial structures was performed. Multiplanar CT image reconstructions were also generated. COMPARISON:  None. FINDINGS: Osseous: No mandibular fracture or dislocation. The temporomandibular joints are maintained. The zygomatic arches are symmetric and intact. No nasal bone fracture. Mild right-sided nasal septal spur and septal  deviation. Orbits: Intact orbits. Sinuses: Nondisplaced fracture of the lateral wall of the left maxillary sinus, series 8/48 extending caudad and involving the posterior left maxilla. Multiple bilateral tooth extractions are noted involving the maxilla with defects along the posterior aspect of the maxilla bilaterally likely from molar extractions. Small air-fluid level in the right maxillary sinus. Soft tissue densities in the left nasal passage and partially opacifying the left ethmoid sinus. The frontal sinus is not pneumatized. The sphenoid sinus is clear. Mastoids are clear. Soft tissues: Moderate diffuse scalp soft tissue swelling with forehead hematoma better visualized on the head CT. Endotracheal and gastric tubes are visualized with mild coiling of the gastric tube in the oropharynx. Limited intracranial: Negative IMPRESSION: 1. Nondisplaced fracture of the lateral wall of the left maxillary sinus along its inferior aspect extending caudad and involving the posterior left maxilla. 2. Soft tissue densities in the left nasal passage and partially opacifying the left ethmoid sinus. 3. Moderate diffuse scalp soft tissue swelling with forehead hematoma better visualized on the head CT. Electronically Signed   By: David  Kwon M.D.   On:   02/18/2018 02:23     IMPRESSION:   *  Ingested FB, x-ray confirms what look like keys and possibly a thumb drive in the region of the left upper quadrant.  *    Altered mental status leading to bizarre, self-destructive behavior. Admission to behavioral health in 2013 for delusional behavior.  *    Facial, scalp, hand injuries due to self-destructive behavior  *   Hx cardiomyopathy.  History congenital heart block.  Cardiac pacemaker in place, original pacemaker at age 6.   PLAN:     *   After Mario Wyatt would like to perform EGD.  It is possible he may not remove the foreign body depending on the size and the risk of perforation associated with removal.  I  discussed the case with his mother.  She has been given final okay as she wants to speak to the patient's brother, his twin, before consenting. Patient may require surgery to remove the objects.   Sarah Gribbin  02/18/2018, 10:56 AM Phone [phone]     Attending Physician Note   I have taken a history, examined the patient and reviewed the chart. I agree with the Advanced Practitioner's note, impression and recommendations.   Ingestion of foreign body - appears to be keys and possibly a flash drive - located in LUQ, which is likely the gastric fundus. EGD today to attempt to remove however depending on the size of the foreign body this may not be safe to pull through the esophagus. Surgery may be required to safely remove. The risks (including bleeding, perforation, infection, missed lesions, medication reactions and possible hospitalization or surgery if complications occur), benefits, and alternatives to endoscopy with possible biopsy and possible dilation were discussed with the patient and they consent to proceed.    Venita Seng, MD FACG [phone]    

## 2018-02-18 NOTE — ED Notes (Signed)
Pt attempted to climb out of bed, not able to redirect pt. [employer] present, pt became agitated, becoming physical with [employer] officer, pt tased, and helped to the ground by [employer], Dr. Hyacinth Meeker, ED resident, this RN, Delorise Jackson, RN present. Pt restrained, and given 50mg  ketamine IV per verbal order Dr. Hyacinth Meeker. Pt assisted back to stretcher, placed on cardiac monitor, supplemental oxygen given, suction set-up, airway cart outside of room.

## 2018-02-18 NOTE — Progress Notes (Signed)
Patient transported to CT 

## 2018-02-18 NOTE — H&P (View-Only) (Signed)
[facility] Consult: 10:56 AM 02/18/2018  LOS: 0 days    Referring Provider: Dr Kendrick Fries Primary Care Physician:  Verlon Au, MD [facility] provider. Primary Gastroenterologist: unassigned    Reason for Consultation: Ingestion of foreign body.   HPI: Mario Wyatt is a 28 y.o. male.  PMH asthma.  Genital heart block, status post pacemaker initially placed age 28.  Cardiomyopathy.  Sciatica, chronic and level back pain.   Psychosis in 03/2011 at which time he was having auditory hallucinations.  Discharge from behavioral health the diagnosis was anxiety, ADHD, delusional disorder.  Positive RPR in 03/2011, infectious disease felt this was a false positive and treatment was not required.  Last week he underwent dental work and has been taking tramadol for pain management since then. 2/17 patient barricaded himself in the apartment, his mother called the police for a wellness check.  His girlfriend was at home.  Once the door was open they found him banging his head against the door.  The sink and tub drains have been stopped up and were overflowing.  Prior to this he had been heating up light bulbs in the open until they broke. Due to extreme agitation the police tasered him.  He continued agitation, combative behavior despite ketamine.  He was intubated and sedated for safety and airway protection.  Studies confirm soft tissue injury to both hand KUB confirms what look like metallic key and rectangular electronic object in the proximal stomach, possibly reflecting ingested foreign bodies.  Bowel gas pattern nonobstructive. CT abdomen pelvis shows streaking metallic artifacts in the region of the stomach consistent with FB ingestion.  No free air.  In the right lung base there is atelectasis  versus pneumonia. Neck CT shows bulging disc Head CT confirms maxillary fracture on the left, hematomas on the scalp and forehead  Tox screen positive for benzodiazepines Has mild renal insufficiency but normal GFR. AST 62, otherwise LFTs normal.  Ammonia level not assayed. No anemia  Today he remains intubated and sedated on the vent.  Past Medical History:  Diagnosis Date  . Anxiety   . Arthritis    "wrists, knees; probably all over my body" (04/05/2017)  . Asthma   . Attention deficit hyperactivity disorder (ADHD)    pt denies this hx on 29/05/2017  . Cardiomyopathy (HCC)   . Chronic back pain    "right neck to lower back; shoulders" (04/05/2017)  . Congenital complete AV block   . Daily headache   . GERD (gastroesophageal reflux disease)   . Macular degeneration    bilateral  . Migraine    "a couple/month" (04/05/2017)  . Pacemaker    Placed when pt was 28 years old.  . S/P cardiac pacemaker procedure, 09/08/14, [employer] 09/09/2014  . Wears glasses     Past Surgical History:  Procedure Laterality Date  . CARDIAC PACEMAKER PLACEMENT  2007   second degree heart block  . EP IMPLANTABLE DEVICE N/A 09/08/2014   Procedure: Pacemaker Implant;  Surgeon: Duke Salvia, MD;  Location: [facility]  INVASIVE CV LAB;  Service: Cardiovascular;  Laterality: N/A;  . HERNIA REPAIR    . PACEMAKER INSERTION  1999  . PACEMAKER LEAD REMOVAL N/A 04/04/2017   Procedure: LEAD EXTRACTION AND NEW LEAD PLACEMENT AND CHANGE OUT OF OLD PACEMAKER;  Surgeon: Marinus Mawaylor, Gregg W, MD;  Location: [facility] OR;  Service: Cardiovascular;  Laterality: N/A;  . PERMANENT PACEMAKER GENERATOR CHANGE  04/04/2017   LEAD EXTRACTION AND NEW LEAD PLACEMENT AND CHANGE OUT OF OLD PACEMAKER  . UMBILICAL HERNIA REPAIR     "when I was a child"    Prior to Admission medications   Medication Sig Start Date End Date Taking? Authorizing Provider  acetaminophen (TYLENOL) 325 MG tablet Take 1-2 tablets (325-650 mg total) by mouth every 4 (four)  hours as needed for mild pain. 04/05/17   Abelino DerrickKilroy, Luke K, PA-C  albuterol (PROVENTIL HFA;VENTOLIN HFA) 108 (90 BASE) MCG/ACT inhaler Inhale 2 puffs into the lungs every 4 (four) hours as needed for wheezing or shortness of breath. 04/11/14   Devoria AlbeKnapp, Iva, MD  carvedilol (COREG) 3.125 MG tablet Take 1 tablet (3.125 mg total) by mouth 2 (two) times daily. Please make yearly appt with Dr.Klein for February for future refill1st attempt 11/12/17 03/12/18  Duke SalviaKlein, Steven C, MD  EPINEPHrine (EPIPEN 2-PAK) 0.3 mg/0.3 mL IJ SOAJ injection Inject 0.3 mLs (0.3 mg total) into the muscle once. 04/11/14   Devoria AlbeKnapp, Iva, MD  ibuprofen (ADVIL,MOTRIN) 200 MG tablet Take 200 mg by mouth every 6 (six) hours as needed for mild pain or moderate pain.    [provider]  lisinopril (PRINIVIL,ZESTRIL) 5 MG tablet Take 1 tablet (5 mg total) by mouth daily. Please make yearly appt with Dr. Graciela HusbandsKlein for February for future refills. 1st attempt 11/12/17   Duke SalviaKlein, Steven C, MD  traMADol (ULTRAM) 50 MG tablet Take 1 tablet (50 mg total) by mouth every 6 (six) hours as needed. 12/01/17   Dahlia ByesBast, Traci A, NP    Scheduled Meds:  Infusions: . dextrose 5% lactated ringers 100 mL/hr at 02/18/18 1044  . propofol (DIPRIVAN) infusion 80 mcg/kg/min (02/18/18 0731)   PRN Meds: albuterol, midazolam, midazolam   Allergies as of 02/17/2018 - Review Complete 02/06/2018  Allergen Reaction Noted  . Iodine Anaphylaxis 04/21/2014  . Shellfish allergy Anaphylaxis 01/04/2011  . Latex Hives and Rash 09/02/2014    Family History  Problem Relation Age of Onset  . Hypertension Mother   . Diabetes Mother   . Bipolar disorder Mother   . Drug abuse Mother   . Glaucoma Father   . Anesthesia problems Neg Hx   . Hypotension Neg Hx   . Malignant hyperthermia Neg Hx   . Pseudochol deficiency Neg Hx     Social History   Socioeconomic History  . Marital status: Single    Spouse name: Not on file  . Number of children: Not on file  . Years of  education: Not on file  . Highest education level: Not on file  Occupational History  . Not on file  Social Needs  . Financial resource strain: Not on file  . Food insecurity:    Worry: Not on file    Inability: Not on file  . Transportation needs:    Medical: Not on file    Non-medical: Not on file  Tobacco Use  . Smoking status: Former Smoker    Packs/day: 1.00    Years: 10.00    Pack years: 10.00    Types: Cigarettes    Last attempt  to quit: 10/01/2013    Years since quitting: 4.3  . Smokeless tobacco: Never Used  Substance and Sexual Activity  . Alcohol use: Not Currently  . Drug use: No  . Sexual activity: Yes  Lifestyle  . Physical activity:    Days per week: Not on file    Minutes per session: Not on file  . Stress: Not on file  Relationships  . Social connections:    Talks on phone: Not on file    Gets together: Not on file    Attends religious service: Not on file    Active member of club or organization: Not on file    Attends meetings of clubs or organizations: Not on file    Relationship status: Not on file  . Intimate partner violence:    Fear of current or ex partner: Not on file    Emotionally abused: Not on file    Physically abused: Not on file    Forced sexual activity: Not on file  Other Topics Concern  . Not on file  Social History Narrative  . Not on file    REVIEW OF SYSTEMS: Unable to screen for review of symptoms as the patient is sedated on the vent.     PHYSICAL EXAM: Vital signs in last 24 hours: Vitals:   02/18/18 1015 02/18/18 1030  BP: (!) 132/109 (!) 135/107  Pulse: 85 76  Resp: (!) 25 (!) 26  Temp:    SpO2: 100% 99%   Wt Readings from Last 3 Encounters:  02/17/18 55.5 kg  02/06/18 55.5 kg  04/05/17 57.4 kg    General: Patient has a swollen, bruised facial appearance.  He is intubated on the vent. Head: Abrasions, bruising, swelling of the face and lips. Eyes: No conjunctival pallor. Ears: Unable to assess  hearing Nose: No congestion or discharge Mouth: Swollen lips, intubated.  Did not open the oral cavity to examine. Neck: No JVD, no masses. Lungs: Nonlabored, clear breathing on anterior auscultation. Heart: RRR.  No MRG.  S1, S2 present Abdomen: Soft.  Not distended.  Bowel sounds hypoactive but no tinkling or tympanitic bowel sounds.  No masses, no HSM.  Unable to elicit tenderness.  There is an oval scar in the region of the umbilicus which is to have been surgically removed Rectal: Not performed Musc/Skeltl: Hands are covered with soft safety mittens which were not removed for examination.  There are some scratches on the wrists Extremities: No CCE. Neurologic: Sedated on vent Tattoos: None observed but did not examine his backside.   Intake/Output from previous day: 02/17 0701 - 02/18 0700 In: 42 [I.V.:42] Out: -  Intake/Output this shift: Total I/O In: 147.1 [I.V.:147.1] Out: 1000 [Urine:1000]  LAB RESULTS: Recent Labs    02/17/18 2218  02/18/18 0358 02/18/18 0426 02/18/18 1052  WBC 9.3  --   --  9.1  --   HGB 14.5   < > 12.2* 13.3 13.3  HCT 43.2   < > 36.0* 39.7 39.0  PLT 259  --   --  234  --    < > = values in this interval not displayed.   BMET Lab Results  Component Value Date   NA 139 02/18/2018   NA 138 02/18/2018   NA 138 02/18/2018   K 3.8 02/18/2018   K 4.3 02/18/2018   K 4.0 02/18/2018   CL 106 02/18/2018   CL 106 02/18/2018   CL 101 02/17/2018   CO2 21 (L) 02/18/2018  CO2 24 02/18/2018   CO2 23 02/17/2018   GLUCOSE 90 02/18/2018   GLUCOSE 109 (H) 02/18/2018   GLUCOSE 80 02/17/2018   BUN 15 02/18/2018   BUN 14 02/18/2018   BUN 14 02/17/2018   CREATININE 1.35 (H) 02/18/2018   CREATININE 1.22 02/18/2018   CREATININE 1.28 (H) 02/17/2018   CALCIUM 8.7 (L) 02/18/2018   CALCIUM 8.4 (L) 02/18/2018   CALCIUM 9.6 02/17/2018   LFT Recent Labs    02/17/18 2218 02/18/18 0136  PROT 7.3 6.5  ALBUMIN 4.2 3.5  AST 62* 62*  ALT 26 26  ALKPHOS  56 51  BILITOT 1.2 1.2   PT/INR Lab Results  Component Value Date   INR 1.01 10/25/2009   Hepatitis Panel No results for input(s): HEPBSAG, HCVAB, HEPAIGM, HEPBIGM in the last 72 hours. C-Diff No components found for: CDIFF Lipase     Component Value Date/Time   LIPASE 27 08/12/2015 1500    Drugs of Abuse     Component Value Date/Time   LABOPIA NONE DETECTED 02/18/2018 0824   COCAINSCRNUR NONE DETECTED 02/18/2018 0824   LABBENZ POSITIVE (A) 02/18/2018 0824   AMPHETMU NONE DETECTED 02/18/2018 0824   THCU NONE DETECTED 02/18/2018 0824   LABBARB NONE DETECTED 02/18/2018 0824     RADIOLOGY STUDIES: Ct Abdomen Pelvis Wo Contrast  Result Date: 02/18/2018 CLINICAL DATA:  Foreign body ingestion (keys) as seen on abdomen radiographs EXAM: CT ABDOMEN AND PELVIS WITHOUT CONTRAST TECHNIQUE: Multidetector CT imaging of the abdomen and pelvis was performed following the standard protocol without IV contrast. COMPARISON:  Same day abdomen radiographs FINDINGS: Lower chest: Pulmonary consolidation at the right lung base with air bronchograms. Right basilar atelectasis and/or pneumonia suspected. The left lung is clear. Hepatobiliary: No focal liver abnormality is seen. No gallstones, gallbladder wall thickening, or biliary dilatation. Pancreas: Unremarkable. No pancreatic ductal dilatation or surrounding inflammatory changes. Spleen: Normal in size without focal abnormality. Adrenals/Urinary Tract: The left adrenal gland and upper pole of kidney are obscured by metallic streak artifacts from the patient's foreign body ingestion. No obstructive uropathy or nephrolithiasis. The urinary bladder is unremarkable. Stomach/Bowel: Metallic streak artifacts consistent with patient's foreign body ingestion is identified along the posterior wall of the expected stomach. Gastric tube extends into the stomach. No bowel obstruction. Paucity of mesenteric fat limits further assessment. Vascular/Lymphatic: No  significant vascular findings are present. No enlarged abdominal or pelvic lymph nodes. Reproductive: Prostate is unremarkable. Other: No abdominal wall hernia or abnormality. No abdominopelvic ascites. Musculoskeletal: No acute or significant osseous findings. IMPRESSION: 1. Pulmonary consolidation at the right lung base with air bronchograms. Right basilar atelectasis and/or pneumonia suspected. 2. Metallic streak artifacts emanating from the expected location of the stomach consistent with foreign body ingestion. No free air. Electronically Signed   By: Tollie Eth M.D.   On: 02/18/2018 02:15   Ct Head Wo Contrast  Result Date: 02/18/2018 CLINICAL DATA:  Patient presents after having barricaded himself in his apartment and hitting his head against a wall multiple times. EXAM: CT HEAD WITHOUT CONTRAST TECHNIQUE: Contiguous axial images were obtained from the base of the skull through the vertex without intravenous contrast. COMPARISON:  None. FINDINGS: Brain: No acute intracranial hemorrhage, midline shift or edema. No hydrocephalus. No extra-axial fluid collections. Midline fourth ventricle basal cisterns without effacement. Gray-white matter distinction is maintained. Vascular: No hyperdense vessel sign Skull: Fracture of the left lateral wall of the maxillary sinus extending into the posterior left maxilla. Asymmetry of the maxilla with bony  deficiency along the posterior aspect of the right maxilla relative to left possibly related to old remote trauma or dental disease and extractions. Sinuses/Orbits: Moderate mucosal thickening of the maxillary sinuses with air-fluid level on the right. Soft tissue densities in the left nasal passage with ethmoid sinus mucosal thickening. The sphenoid sinus is clear. The frontal sinus is not pneumatized. Other: Hematoma of the forehead with moderate scalp and forehead soft tissue swelling noted. Hypodensity at the base of the tongue is again noted possibly a thyroglossal  duct cyst. IMPRESSION: 1. No acute intracranial abnormality. 2. Fracture of the left lateral wall of the maxillary sinus extending into the posterior left maxilla. Bony deficiency along the posterior aspect of the right maxilla relative to left possibly related to old trauma, dental disease and/or extractions. 3. Hematoma of the forehead with moderate scalp and forehead soft tissue swelling. Electronically Signed   By: Tollie Eth M.D.   On: 02/18/2018 02:09   Ct Cervical Spine Wo Contrast  Result Date: 02/18/2018 CLINICAL DATA:  Recent tooth extractions for which the patient typical out of pain meds. Patient began banging his head against a wall multiple times and presents with bruising beneath both eyes and swelling of the forehead. EXAM: CT CERVICAL SPINE WITHOUT CONTRAST TECHNIQUE: Multidetector CT imaging of the cervical spine was performed without intravenous contrast. Multiplanar CT image reconstructions were also generated. COMPARISON:  07/30/2016 FINDINGS: Alignment: Maintained cervical lordosis Skull base and vertebrae: Intact Soft tissues and spinal canal: The patient is intubated. A gastric tube is seen coiled in the oropharynx before projecting caudad into the esophagus. The tip is excluded on this study. Disc levels: C5-6 central to right central disc bulge impressing upon the thecal sac. Mild central canal stenosis at this level. No jumped or perched facets. No significant foraminal encroachment. Upper chest: Negative Other: Redemonstration of a cyst at the base of the tongue possibly representing a thyroglossal duct cyst. This measures at least 2.5 cm on the sagittal view. IMPRESSION: 1. C5-6 central to right central disc bulge impressing upon the thecal sac. Mild central canal stenosis at this level. 2. The patient's gastric tube is coiled in the oropharynx before projecting caudad into the esophagus. The tip is excluded on this study. 3. No acute cervical spine fracture. Electronically Signed    By: Tollie Eth M.D.   On: 02/18/2018 02:27   Dg Hand 2 View Left  Result Date: 02/18/2018 CLINICAL DATA:  Initial evaluation for acute injury, hit head on wall, pain. EXAM: LEFT HAND - 2 VIEW COMPARISON:  None. FINDINGS: Single oblique view of the left hand demonstrates no acute fracture or dislocation. Joint spaces maintained. Soft tissue irregularity at the ulnar aspect of the mid left hand, suggesting small soft tissue injury. No radiopaque foreign body. IMPRESSION: 1. No acute osseous abnormality. 2. Focal soft tissue irregularity at the ulnar aspect of the left hand, suggesting soft tissue injury. No radiopaque foreign body. Electronically Signed   By: Rise Mu M.D.   On: 02/18/2018 00:37   Dg Chest Portable 1 View  Result Date: 02/18/2018 CLINICAL DATA:  Initial evaluation for acute injury, struck head on wall. EXAM: PORTABLE CHEST 1 VIEW COMPARISON:  Prior radiograph from 04/05/2017 FINDINGS: Endotracheal tube in place with tip positioned 5 cm above the carina. Enteric tube courses into the abdomen. Left-sided pacemaker/AICD in place. Cardiac and mediastinal silhouettes are stable, and remain within normal limits. Lungs mildly hypoinflated. No focal infiltrates. No edema or effusion. No pneumothorax. Visualized osseous  structures within normal limits. IMPRESSION: 1. Tip of endotracheal tube position 5 cm above the carina. 2. Enteric tube courses into the upper abdomen, nonvisualization of tip or side hole. 3. Clear lungs.  No other active cardiopulmonary disease. Electronically Signed   By: Rise Mu M.D.   On: 02/18/2018 00:09   Dg Abd Portable 1v  Result Date: 02/18/2018 CLINICAL DATA:  Initial evaluation for foreign body. EXAM: PORTABLE ABDOMEN - 1 VIEW COMPARISON:  None. FINDINGS: Enteric tube in place with tip overlying the gastric body, side hole just beyond the GE junction. Bowel gas pattern within normal limits. Moderate retained stool within the colon. Metallic  keys overlie the proximal stomach, suggestive of foreign body. Few adjacent subcentimeter linear densities. Additional adjacent rectangular electronic density, which also may reflect a foreign body. IMPRESSION: 1. Metallic keys and rectangular electronic object overlying the proximal stomach, which could reflect ingested foreign bodies. 2. Enteric tube in place with tip overlying the gastric body, side hole beyond the GE junction. 3. Nonobstructive bowel gas pattern. Electronically Signed   By: Rise Mu M.D.   On: 02/18/2018 00:42   Dg Hand Complete Right  Result Date: 02/18/2018 CLINICAL DATA:  Initial evaluation for acute injury, hit head on wall. EXAM: RIGHT HAND - COMPLETE 3+ VIEW COMPARISON:  None. FINDINGS: There is no evidence of fracture or dislocation. There is no evidence of arthropathy or other focal bone abnormality. Mild soft tissue irregularity at the ulnar aspect of the right hand. Additionally, mild soft tissue swelling overlies the metacarpal heads. IMPRESSION: 1. No acute osseus abnormality. 2. Mild soft tissue swelling overlying the metacarpal heads with additional soft tissue irregularity at the ulnar aspect of the hand, which could reflect acute soft tissue injury. No radiopaque foreign body. Electronically Signed   By: Rise Mu M.D.   On: 02/18/2018 00:32   Ct Maxillofacial Wo Contrast  Result Date: 02/18/2018 CLINICAL DATA:  Patient dental work a few days ago and has taken lot of pain meds. Swelling of the forehead with bruising under both eyes. EXAM: CT MAXILLOFACIAL WITHOUT CONTRAST TECHNIQUE: Multidetector CT imaging of the maxillofacial structures was performed. Multiplanar CT image reconstructions were also generated. COMPARISON:  None. FINDINGS: Osseous: No mandibular fracture or dislocation. The temporomandibular joints are maintained. The zygomatic arches are symmetric and intact. No nasal bone fracture. Mild right-sided nasal septal spur and septal  deviation. Orbits: Intact orbits. Sinuses: Nondisplaced fracture of the lateral wall of the left maxillary sinus, series 8/48 extending caudad and involving the posterior left maxilla. Multiple bilateral tooth extractions are noted involving the maxilla with defects along the posterior aspect of the maxilla bilaterally likely from molar extractions. Small air-fluid level in the right maxillary sinus. Soft tissue densities in the left nasal passage and partially opacifying the left ethmoid sinus. The frontal sinus is not pneumatized. The sphenoid sinus is clear. Mastoids are clear. Soft tissues: Moderate diffuse scalp soft tissue swelling with forehead hematoma better visualized on the head CT. Endotracheal and gastric tubes are visualized with mild coiling of the gastric tube in the oropharynx. Limited intracranial: Negative IMPRESSION: 1. Nondisplaced fracture of the lateral wall of the left maxillary sinus along its inferior aspect extending caudad and involving the posterior left maxilla. 2. Soft tissue densities in the left nasal passage and partially opacifying the left ethmoid sinus. 3. Moderate diffuse scalp soft tissue swelling with forehead hematoma better visualized on the head CT. Electronically Signed   By: Tollie Eth M.D.   On:  02/18/2018 02:23     IMPRESSION:   *  Ingested FB, x-ray confirms what look like keys and possibly a thumb drive in the region of the left upper quadrant.  *    Altered mental status leading to bizarre, self-destructive behavior. Admission to behavioral health in 2013 for delusional behavior.  *    Facial, scalp, hand injuries due to self-destructive behavior  *   Hx cardiomyopathy.  History congenital heart block.  Cardiac pacemaker in place, original pacemaker at age 57.   PLAN:     *   After Russella Dar would like to perform EGD.  It is possible he may not remove the foreign body depending on the size and the risk of perforation associated with removal.  I  discussed the case with his mother.  She has been given final okay as she wants to speak to the patient's brother, his twin, before consenting. Patient may require surgery to remove the objects.   Jennye Moccasin  02/18/2018, 10:56 AM Phone [phone]     Attending Physician Note   I have taken a history, examined the patient and reviewed the chart. I agree with the Advanced Practitioner's note, impression and recommendations.   Ingestion of foreign body - appears to be keys and possibly a flash drive - located in LUQ, which is likely the gastric fundus. EGD today to attempt to remove however depending on the size of the foreign body this may not be safe to pull through the esophagus. Surgery may be required to safely remove. The risks (including bleeding, perforation, infection, missed lesions, medication reactions and possible hospitalization or surgery if complications occur), benefits, and alternatives to endoscopy with possible biopsy and possible dilation were discussed with the patient and they consent to proceed.    Claudette Head, MD FACG [phone]

## 2018-02-18 NOTE — H&P (Signed)
NAME:  Mario Wyatt, MRN:  [id number], DOB:  Feb 26, 1990, LOS: 0 ADMISSION DATE:  02/17/2018, CONSULTATION DATE:  2/18 REFERRING MD:  Dr. Elesa Massed EDP, CHIEF COMPLAINT:  AMS   Brief History   28 year old male requiring intubation for extreme agitation and psychosis.   History of present illness   28 year old male with past medical history as below, which is significant for asthma, congenital complete AV block, cardiomyopathy, and pacemaker.  History is somewhat limited as he is unable to provide so it has been obtained via chart review and discussion with ED staff.  Supposedly he had dental work a few days prior to admission and had been taking pain medications as result.  On 2/17 he barricaded himself in his apartment and [employer] was alerted by his girlfriend.  Upon their arrival they found him banging his head against the door.  All of his drains were stopped up and overflowing.  He had been putting light bulbs in the oven and heating them until they broke.  He was transported to the emergency department and was combative with police officers prompting them to tase him.  Despite 2 doses of ketamine for sedation he continued to be combative and agitated.  He was intubated for safety and airway protection.  He was started on a propofol infusion.  Imaging concerning for facial bone fracture and foreign body ingestion.  PCCM asked to admit.  Past Medical History   has a past medical history of Anxiety, Arthritis, Asthma, Attention deficit hyperactivity disorder (ADHD), Cardiomyopathy (HCC), Chronic back pain, Congenital complete AV block, Daily headache, GERD (gastroesophageal reflux disease), Macular degeneration, Migraine, Pacemaker, S/P cardiac pacemaker procedure, 09/08/14, [employer] (09/09/2014), and Wears glasses.   Significant Hospital Events   2/17 admitted  Consults:    Procedures:  ETT 2/18 >  Significant Diagnostic Tests:  KUB 2/17 > Metallic keys and rectangular electronic object  overlying the proximal stomach, which could reflect ingested foreign bodies CT abdomen/pelvis 2/18 > Pulmonary consolidation at the right lung base with air bronchograms. Right basilar atelectasis and/or pneumonia suspected. Metallic streak artifacts emanating from the expected location of the stomach consistent with foreign body ingestion. No free air. CT maxillofacial 2/18 > Nondisplaced fracture of the lateral wall of the left maxillary sinus along its inferior aspect extending caudad and involving the posterior left maxilla. Soft tissue densities in the left nasal passage and partially opacifying the left ethmoid sinus. Moderate diffuse scalp soft tissue swelling with forehead hematoma better visualized on the head CT. CT head/Cspine 2/18 > No acute intracranial abnormality. C5-6 central to right central disc bulge impressing upon the thecal sac. Mild central canal stenosis at this level.  Micro Data:  Sputum 2/18 > Blood 2/18 >  Antimicrobials:    Interim history/subjective:    Objective   Blood pressure (!) 118/91, pulse 72, temperature 99.5 F (37.5 C), temperature source Oral, resp. rate (!) 26, height 5\' 4"  (1.626 m), weight 55.5 kg, SpO2 99 %.    Vent Mode: PRVC FiO2 (%):  [50 %-100 %] 50 % Set Rate:  [18 bmp-26 bmp] 26 bmp Vt Set:  [350 mL] 350 mL PEEP:  [5 cmH20] 5 cmH20 Plateau Pressure:  [10 cmH20] 10 cmH20   Intake/Output Summary (Last 24 hours) at 02/18/2018 0333 Last data filed at 02/18/2018 0153 Gross per 24 hour  Intake 42.02 ml  Output -  Net 42.02 ml   Filed Weights   02/17/18 2150  Weight: 55.5 kg  Examination: General: Young adult male on vent HENT: bilateral periorbital bruising/hematoma. R>L. Several areas of abrasion Lungs: Clear Cardiovascular: Tachy, regular, no MRG Abdomen: Soft, non-tender, non-distended Extremities: No acute deformity or ROM limitations prior to intubation Neuro: [facility] Problem list      Assessment & Plan:   Acute psychosis: Etiology unclear. He supposedly has no history of this, but was purposely overflowing his sink/tubs, bursting lightbulbs in the over, and smacking his head into the walls. Tried to attack police officers and was tased.  - Intubated for safety - [place] for RASS goal -2 to -3. Propofol. - Daily WUA - Full vent support - ABG - VAP bundle - Low threshold to transition to precedex or start seroquel.  - Tox screen pending - Check osmolar gap  Congenital AV block: Pacemaker in place, now has been tased - Repeat ECG in AM - Holding home coreg, lisinopril for now   C5-6 central disc bulge: impressing upon the thecal sac - neurovascular checks - C- collar in place - No MRI due to ingestion - may need neurosurgery  Foreign body ingestion: Does not appear to be anything sharp, no batteries or magnets. GI consult deferred in ED.  - Monitor - may need GI or surgery depending on course.   L maxillary sinus lateral wall fracture: nondisplaced.  - monitor  Best practice:  Diet: NPO Pain/Anxiety/Delirium protocol (if indicated): Propofol. RASS goal -2 to -3 VAP protocol (if indicated): yes DVT prophylaxis: SCD GI prophylaxis: na Glucose control: na Mobility: BR Code Status: FULL Family Communication: None available Disposition: ICU  Labs   CBC: Recent Labs  Lab 02/17/18 2218 02/18/18 0037  WBC 9.3  --   NEUTROABS 7.4  --   HGB 14.5 13.6  HCT 43.2 40.0  MCV 88.9  --   PLT 259  --     Basic Metabolic Panel: Recent Labs  Lab 02/17/18 2218 02/18/18 0037 02/18/18 0136  NA 136 139 138  K 3.5 3.9 3.9  CL 101  --  106  CO2 23  --  24  GLUCOSE 80  --  109*  BUN 14  --  14  CREATININE 1.28*  --  1.22  CALCIUM 9.6  --  8.4*   GFR: Estimated Creatinine Clearance: 70.8 mL/min (by C-G formula based on SCr of 1.22 mg/dL). Recent Labs  Lab 02/17/18 2218  WBC 9.3    Liver Function Tests: Recent Labs  Lab 02/17/18 2218  02/18/18 0136  AST 62* 62*  ALT 26 26  ALKPHOS 56 51  BILITOT 1.2 1.2  PROT 7.3 6.5  ALBUMIN 4.2 3.5   No results for input(s): LIPASE, AMYLASE in the last 168 hours. No results for input(s): AMMONIA in the last 168 hours.  ABG    Component Value Date/Time   PHART 7.050 (LL) 02/18/2018 0037   PCO2ART 77.6 (HH) 02/18/2018 0037   PO2ART 207.0 (H) 02/18/2018 0037   HCO3 21.4 02/18/2018 0037   TCO2 24 02/18/2018 0037   ACIDBASEDEF 10.0 (H) 02/18/2018 0037   O2SAT 99.0 02/18/2018 0037     Coagulation Profile: No results for input(s): INR, PROTIME in the last 168 hours.  Cardiac Enzymes: No results for input(s): CKTOTAL, CKMB, CKMBINDEX, TROPONINI in the last 168 hours.  HbA1C: No results found for: HGBA1C  CBG: No results for input(s): GLUCAP in the last 168 hours.  Review of Systems:   Unable as patient is encephalopathic.   Past  Medical History  He,  has a past medical history of Anxiety, Arthritis, Asthma, Attention deficit hyperactivity disorder (ADHD), Cardiomyopathy (HCC), Chronic back pain, Congenital complete AV block, Daily headache, GERD (gastroesophageal reflux disease), Macular degeneration, Migraine, Pacemaker, S/P cardiac pacemaker procedure, 09/08/14, [employer] medical (09/09/2014), and Wears glasses.   Surgical History    Past Surgical History:  Procedure Laterality Date  . CARDIAC PACEMAKER PLACEMENT  2007   second degree heart block  . EP IMPLANTABLE DEVICE N/A 09/08/2014   Procedure: Pacemaker Implant;  Surgeon: Duke Salvia, MD;  Location: [facility] INVASIVE CV LAB;  Service: Cardiovascular;  Laterality: N/A;  . HERNIA REPAIR    . PACEMAKER INSERTION  1999  . PACEMAKER LEAD REMOVAL N/A 04/04/2017   Procedure: LEAD EXTRACTION AND NEW LEAD PLACEMENT AND CHANGE OUT OF OLD PACEMAKER;  Surgeon: Marinus Maw, MD;  Location: [facility] OR;  Service: Cardiovascular;  Laterality: N/A;  . PERMANENT PACEMAKER GENERATOR CHANGE  04/04/2017   LEAD EXTRACTION AND NEW LEAD PLACEMENT  AND CHANGE OUT OF OLD PACEMAKER  . UMBILICAL HERNIA REPAIR     "when I was a child"     Social History   reports that he quit smoking about 4 years ago. His smoking use included cigarettes. He has a 10.00 pack-year smoking history. He has never used smokeless tobacco. He reports previous alcohol use. He reports that he does not use drugs.   Family History   His family history includes Bipolar disorder in his mother; Diabetes in his mother; Drug abuse in his mother; Glaucoma in his father; Hypertension in his mother. There is no history of Anesthesia problems, Hypotension, Malignant hyperthermia, or Pseudochol deficiency.   Allergies Allergies  Allergen Reactions  . Iodine Anaphylaxis  . Shellfish Allergy Anaphylaxis  . Latex Hives and Rash     Home Medications  Prior to Admission medications   Medication Sig Start Date End Date Taking? Authorizing Provider  acetaminophen (TYLENOL) 325 MG tablet Take 1-2 tablets (325-650 mg total) by mouth every 4 (four) hours as needed for mild pain. 04/05/17   Abelino Derrick, PA-C  albuterol (PROVENTIL HFA;VENTOLIN HFA) 108 (90 BASE) MCG/ACT inhaler Inhale 2 puffs into the lungs every 4 (four) hours as needed for wheezing or shortness of breath. 04/11/14   Devoria Albe, MD  carvedilol (COREG) 3.125 MG tablet Take 1 tablet (3.125 mg total) by mouth 2 (two) times daily. Please make yearly appt with Dr.Klein for February for future refill1st attempt 11/12/17 03/12/18  Duke Salvia, MD  EPINEPHrine (EPIPEN 2-PAK) 0.3 mg/0.3 mL IJ SOAJ injection Inject 0.3 mLs (0.3 mg total) into the muscle once. 04/11/14   Devoria Albe, MD  ibuprofen (ADVIL,MOTRIN) 200 MG tablet Take 200 mg by mouth every 6 (six) hours as needed for mild pain or moderate pain.    [provider]  lisinopril (PRINIVIL,ZESTRIL) 5 MG tablet Take 1 tablet (5 mg total) by mouth daily. Please make yearly appt with Dr. Graciela Husbands for February for future refills. 1st attempt 11/12/17   Duke Salvia,  MD  traMADol (ULTRAM) 50 MG tablet Take 1 tablet (50 mg total) by mouth every 6 (six) hours as needed. 12/01/17   Janace Aris, NP     Critical care time: 35 mins     Joneen Roach, AGACNP-BC [facility] Pulmonary/Critical Care Pager [phone] or [phone]  02/18/2018 4:04 AM

## 2018-02-18 NOTE — Progress Notes (Signed)
eLink Physician-Brief Progress Note Patient Name: Mario Wyatt DOB: [birth year] MRN: [id number]   Date of Service  02/18/2018  HPI/Events of Note  Hypoglycemia - Blood glucose = 67.  Currently on D10W at 20 mL/hour.   eICU Interventions  Will order: 1. Increase D10W to 40 mL/hour.      Intervention Category Major Interventions: Other:  Mario Wyatt 02/18/2018, 11:42 PM

## 2018-02-18 NOTE — Progress Notes (Signed)
eLink Physician-Brief Progress Note Patient Name: Mario Wyatt DOB: Jul 28, 1990 MRN: [id number]   Date of Service  02/18/2018  HPI/Events of Note  Agitation - Request for bilateral soft wrist restraints.   eICU Interventions  Will order: 1. Bilateral soft wrist restraints.      Intervention Category Major Interventions: Delirium, psychosis, severe agitation - evaluation and management  [name] 02/18/2018, 8:13 PM

## 2018-02-18 NOTE — Progress Notes (Addendum)
NAME:  Mario Wyatt, MRN:  [id number], DOB:  Dec 22, 1990, LOS: 0 ADMISSION DATE:  02/17/2018, CONSULTATION DATE:  2/18 REFERRING MD:  Dr. Elesa Massed EDP, CHIEF COMPLAINT:  AMS   Brief History   28 year old male requiring intubation for extreme agitation and psychosis.   History of present illness   28 year old male with past medical history as below, which is significant for asthma, congenital complete AV block, cardiomyopathy, and pacemaker.  History is somewhat limited as he is unable to provide so it has been obtained via chart review and discussion with ED staff.  Supposedly he had dental work a few days prior to admission and had been taking pain medications as result.  On 2/17 he barricaded himself in his apartment and [employer] was alerted by his girlfriend.  Upon their arrival they found him banging his head against the door.  All of his drains were stopped up and overflowing.  He had been putting light bulbs in the oven and heating them until they broke.  He was transported to the emergency department and was combative with police officers prompting them to tase him.  Despite 2 doses of ketamine for sedation he continued to be combative and agitated.  He was intubated for safety and airway protection.  He was started on a propofol infusion.  Imaging concerning for facial bone fracture and foreign body ingestion.  PCCM asked to admit.  Past Medical History   has a past medical history of Anxiety, Arthritis, Asthma, Attention deficit hyperactivity disorder (ADHD), Cardiomyopathy (HCC), Chronic back pain, Congenital complete AV block, Daily headache, GERD (gastroesophageal reflux disease), Macular degeneration, Migraine, Pacemaker, S/P cardiac pacemaker procedure, 09/08/14, [employer] medical (09/09/2014), and Wears glasses.  Significant Hospital Events   2/17 admitted  Consults:  GI 2/18  Procedures:  ETT 2/18 >  Significant Diagnostic Tests:  KUB 2/17 > Metallic keys and rectangular electronic  object overlying the proximal stomach, which could reflect ingested foreign bodies CT abdomen/pelvis 2/18 > Pulmonary consolidation at the right lung base with air bronchograms. Right basilar atelectasis and/or pneumonia suspected. Metallic streak artifacts emanating from the expected location of the stomach consistent with foreign body ingestion. No free air. CT maxillofacial 2/18 > Nondisplaced fracture of the lateral wall of the left maxillary sinus along its inferior aspect extending caudad and involving the posterior left maxilla. Soft tissue densities in the left nasal passage and partially opacifying the left ethmoid sinus. Moderate diffuse scalp soft tissue swelling with forehead hematoma better visualized on the head CT. CT head/Cspine 2/18 > No acute intracranial abnormality. C5-6 central to right central disc bulge impressing upon the thecal sac. Mild central canal stenosis at this level.  Micro Data:  Sputum 2/18 > Blood 2/18 >  Antimicrobials:    Interim history/subjective:  Remains on propofol 43mcg/kg/hr- slowly weaning  Hypoglycemic episode Remains afebrile  Objective   Blood pressure (!) 115/92, pulse 84, temperature 97.9 F (36.6 C), temperature source Oral, resp. rate (!) 26, height 5\' 4"  (1.626 m), weight 55.5 kg, SpO2 99 %.    Vent Mode: PRVC FiO2 (%):  [30 %-100 %] 30 % Set Rate:  [18 bmp-26 bmp] 26 bmp Vt Set:  [350 mL-450 mL] 450 mL PEEP:  [5 cmH20] 5 cmH20 Plateau Pressure:  [7 cmH20-14 cmH20] 7 cmH20   Intake/Output Summary (Last 24 hours) at 02/18/2018 1003 Last data filed at 02/18/2018 0731 Gross per 24 hour  Intake 189.14 ml  Output -  Net 189.14 ml  Filed Weights   02/17/18 2150  Weight: 55.5 kg    Examination: General:  Young adult male sedate on MV HEENT: MM pink/moist, ETT/ OGT, bilateral periorbital ecchymosis/ edema R>L, large frontal/ forehead bruise/mild hematoma with bruising to right side of face, pupils 3/reactive, anicteric, c-collar  remains in place Neuro: sedated on propofol currently at 50 and slowly weaning CV: AV paced, pacer left chest PULM: even/non-labored on MV, synchronous, lungs bilaterally clear GI: soft, BS active  Extremities: warm/dry, no edema, multiple areas of abrasions to right shoulder and arms, some to lower legs,  R hand with the most abrasions/ ecchymosis, no obvious deformities noted  Skin: no rashes   Resolved Hospital Problem list     Assessment & Plan:   Acute psychosis: Etiology unclear. He supposedly has no history of this, but was purposely overflowing his sink/tubs, bursting lightbulbs in the over, and smacking his head into the walls. Tried to attack police officers and was tased.  P:  UDS +benzo, ETOH neg, salicylate neg, sOsm 286 Will need psych eval when mental status resolves May consider switching from propofol to precedex to attempt extubation  Acute respiratory insufficiency R basilar atelectasis  - intubated for airway protection P:  Continue full MV support, PRVC 8cc/kg, rate 26 Repeat ABG now  VAP bundle  Trend CXR Will need to remain intubated till GI evaluation  PAD protocol propofol, prn fentanyl  RASS goal -2, add bowel regimen when able per GI  Congenital AV block: Pacemaker in place, now has been tased - repeat EKG- dual paced  - Holding home coreg, lisinopril for now   C5-6 central disc bulge: impressing upon the thecal sac - prior mild c6 compression noted in care everywhere notes on 08/2017 P:  Ongoing neurovascular checks Continue  C- collar in place No MRI due to ingestion may need neurosurgery consult  Foreign body ingestion: Does not appear to be anything sharp, no batteries or magnets. GI consult deferred in ED. P:  GI consulted, ask for patient to remain intubated till evaluated.  L maxillary sinus lateral wall fracture: nondisplaced.  P:  monitor  AKI P:  Continue IVF 100 ml/hr Normotensive  Trend BMP/ UOP  Hypoglycemia P:   NPO  till GI eval Change IVF to D5LR at 100 ml/hr Frequent CBGs  Best practice:  Diet: NPO Pain/Anxiety/Delirium protocol (if indicated): Propofol. RASS goal -2 to -3 VAP protocol (if indicated): yes DVT prophylaxis: SCD GI prophylaxis: na Glucose control: na Mobility: BR Code Status: FULL Family Communication: No family at bedside 2/18 Disposition: ICU  Labs   CBC: Recent Labs  Lab 02/17/18 2218 02/18/18 0037 02/18/18 0358 02/18/18 0426  WBC 9.3  --   --  9.1  NEUTROABS 7.4  --   --   --   HGB 14.5 13.6 12.2* 13.3  HCT 43.2 40.0 36.0* 39.7  MCV 88.9  --   --  89.6  PLT 259  --   --  234    Basic Metabolic Panel: Recent Labs  Lab 02/17/18 2218 02/18/18 0037 02/18/18 0136 02/18/18 0358 02/18/18 0426  NA 136 139 138 138 138  K 3.5 3.9 3.9 4.0 4.3  CL 101  --  106  --  106  CO2 23  --  24  --  21*  GLUCOSE 80  --  109*  --  90  BUN 14  --  14  --  15  CREATININE 1.28*  --  1.22  --  1.35*  CALCIUM 9.6  --  8.4*  --  8.7*  MG  --   --   --   --  2.7*  PHOS  --   --   --   --  5.0*   GFR: Estimated Creatinine Clearance: 64 mL/min (A) (by C-G formula based on SCr of 1.35 mg/dL (H)). Recent Labs  Lab 02/17/18 2218 02/18/18 0426  WBC 9.3 9.1    Liver Function Tests: Recent Labs  Lab 02/17/18 2218 02/18/18 0136  AST 62* 62*  ALT 26 26  ALKPHOS 56 51  BILITOT 1.2 1.2  PROT 7.3 6.5  ALBUMIN 4.2 3.5   No results for input(s): LIPASE, AMYLASE in the last 168 hours. No results for input(s): AMMONIA in the last 168 hours.  ABG    Component Value Date/Time   PHART 7.356 02/18/2018 0358   PCO2ART 42.9 02/18/2018 0358   PO2ART 204.0 (H) 02/18/2018 0358   HCO3 24.2 02/18/2018 0358   TCO2 26 02/18/2018 0358   ACIDBASEDEF 2.0 02/18/2018 0358   O2SAT 100.0 02/18/2018 0358     Coagulation Profile: No results for input(s): INR, PROTIME in the last 168 hours.  Cardiac Enzymes: No results for input(s): CKTOTAL, CKMB, CKMBINDEX, TROPONINI in the last 168  hours.  HbA1C: No results found for: HGBA1C  CBG: Recent Labs  Lab 02/18/18 0810 02/18/18 0845  GLUCAP 67* 188*     Critical care time: 35     Posey Boyer, MSN, AGACNP-BC [place] [facility] Pgr: [phone] or if no answer [phone] 02/18/2018, 10:03 AM

## 2018-02-18 NOTE — Progress Notes (Signed)
  Spoke with Aundra Millet, NP with neurosurgery regarding patient's abnormal CT cervical findings.  Patient will need formal NSGY consult when patient can participate in neuro exam/ mental status improves.     Posey Boyer, MSN, AGACNP-BC [place] [facility] Pgr: [phone] or if no answer [phone] 02/18/2018, 7:08 PM

## 2018-02-18 NOTE — Progress Notes (Signed)
CRITICAL VALUE ALERT  Critical Value: Glucose of 67  Date & Time Notied:  0810  Orders Received/Actions taken: implemented hypoglycemic protocol    0846: BS 188

## 2018-02-18 NOTE — Progress Notes (Signed)
Discussed with pharmacy, was going to start TF given he is post procedure and still requiring D10 with borderline blood sugars, however ALL tube feeds contain iodine in which patient has anaphylaxis reaction to.  Will continue D10 gtt for now.   Posey Boyer, MSN, AGACNP-BC [place] [facility] Pgr: [phone] or if no answer [phone] 02/18/2018, 4:52 PM

## 2018-02-18 NOTE — ED Notes (Signed)
Patient transported to CT 

## 2018-02-18 NOTE — Progress Notes (Signed)
Patient transported to 8M from the ED without any apparent complications.

## 2018-02-18 NOTE — ED Provider Notes (Signed)
12:45 AM Assumed care from Dr. Hyacinth Meeker.  Patient is a 28 year old male with no known past medical history who presents to the emergency department today for concerns for psychotic break.  Patient reportedly had very bizarre behavior today.  Locked himself in the apartment and was putting light bulbs and other plastic objects in the oven.  Was letting his bathtub to run over and playing with the breaker box.  Reportedly had religious delusions as well.  Patient was hitting his head on a metal door at home.  Has bruising, swelling to the forehead and around both eyes.  Patient brought in by [facility] under involuntary commitment.  Very agitated here and threatened police and tried to attack police officer.  Patient was tased, restrained, sedated for patient and staff safety.  Patient continues to be agitated despite 2 rounds of ketamine.  Decision was made to intubate patient for airway protection and sedation.  Patient found to have respiratory acidosis.  Increased his rate from 20 up to 26.  X-ray of his abdomen showed that he ingested multiple foreign bodies - does not appear to be sharp objects, magnets or batteries.  CT, head, cervical spine, abdomen pelvis pending.  2:30 AM  Repeat CMP shows no metabolic derangement.  We will also repeat ABG.  CT of the head shows no acute intracranial abnormality.  He does have a fracture of the left lateral wall of the maxillary sinus with multiple forehead hematomas.  CT of the cervical spine shows a C5-6 central to right central disc bulge impressing on the thecal sac with mild central canal stenosis at this level.  Given he is sedated, intubated will leave cervical collar in place at this time.  No acute cervical fracture.  CT of the abdomen pelvis shows pulmonary consolidation in the right lung base with air bronchograms.  I suspect this is likely atelectasis rather than pneumonia.  Metallic streak artifacts noted within the stomach consistent with  foreign body ingestion.  No free air.  Will discuss with critical care for admission.  2:39 AM Discussed patient's case with intensivist, Dr. Arsenio Loader.  I have recommended admission and patient (and family if present) agree with this plan. Admitting physician will place admission orders.   I reviewed all nursing notes, vitals, pertinent previous records, EKGs, lab and urine results, imaging (as available).      CRITICAL CARE Performed by: Rochele Raring   Total critical care time: 45 minutes  Critical care time was exclusive of separately billable procedures and treating other patients.  Critical care was necessary to treat or prevent imminent or life-threatening deterioration.  Critical care was time spent personally by me on the following activities: development of treatment plan with patient and/or surrogate as well as nursing, discussions with consultants, evaluation of patient's response to treatment, examination of patient, obtaining history from patient or surrogate, ordering and performing treatments and interventions, ordering and review of laboratory studies, ordering and review of radiographic studies, pulse oximetry and re-evaluation of patient's condition.    Sandrea Boer, Layla Maw, DO 02/18/18 [phone]

## 2018-02-18 NOTE — Interval H&P Note (Signed)
History and Physical Interval Note:  02/18/2018 2:21 PM  Mario Wyatt  has presented today for surgery, with the diagnosis of Foreign body, house keys, seen on x-ray and appear to be in the stomach.  The various methods of treatment have been discussed with the patient and family. After consideration of risks, benefits and other options for treatment, the patient has consented to  Procedure(s): ESOPHAGOGASTRODUODENOSCOPY (EGD) (N/A) FOREIGN BODY REMOVAL (N/A) as a surgical intervention .  The patient's history has been reviewed, patient examined, no change in status, stable for surgery.  I have reviewed the patient's chart and labs.  Questions were answered to the patient's satisfaction.     Venita Lick. Russella Dar

## 2018-02-18 NOTE — Progress Notes (Signed)
Patient transported back to ED015 without any complications.

## 2018-02-18 NOTE — Op Note (Addendum)
[facility] Patient Name: Mario Wyatt Procedure Date : 02/18/2018 MRN: [id number] Attending MD: Meryl Dare , MD Date of Birth: 05/30/1990 CSN: [id number] Age: 28 Admit Type: Inpatient Procedure:                Upper GI endoscopy Indications:              Foreign body in the stomach Providers:                Venita Lick. Russella Dar, MD, Zoe Lan, RN, Beryle Beams, Technician Referring MD:             Brooke Pace. Mcquaid, MD Medicines:                Midazolam 4 mg IV, Fentanyl 50 micrograms IV,                            Diphenhydramine 50 mg IV Complications:            No immediate complications. Estimated Blood Loss:     Estimated blood loss: none. Procedure:                Pre-Anesthesia Assessment:                           - Prior to the procedure, a History and Physical                            was performed, and patient medications and                            allergies were reviewed. The patient's tolerance of                            previous anesthesia was also reviewed. The risks                            and benefits of the procedure and the sedation                            options and risks were discussed with the patient.                            All questions were answered, and informed consent                            was obtained. Prior Anticoagulants: The patient has                            taken no previous anticoagulant or antiplatelet                            agents. ASA Grade Assessment: II - A patient with  mild systemic disease. After reviewing the risks                            and benefits, the patient was deemed in                            satisfactory condition to undergo the procedure.                           After obtaining informed consent, the endoscope was                            passed under direct vision. Throughout the   procedure, the patient's blood pressure, pulse, and                            oxygen saturations were monitored continuously. The                            GIF-H190 ([id number]) Olympus gastroscope was                            introduced through the mouth, and advanced to the                            second part of duodenum. The upper GI endoscopy was                            somewhat difficult due to presence of foreign                            bodies. Patient was intubated and in the ICU. The                            patient tolerated the procedure well. Scope In: Scope Out: Findings:      LA Grade A (one or more mucosal breaks less than 5 mm, not extending       between tops of 2 mucosal folds) esophagitis with no bleeding was found       at the gastroesophageal junction.      The exam of the esophagus was otherwise normal.      2 keys, brush, jump drive and amulet were found in the gastric fundus.       Removal was accomplished with a Raptor grasping device for each item       separately.      The exam of the stomach was otherwise normal.      The duodenal bulb and second portion of the duodenum were normal. Impression:               - LA Grade A reflux esophagitis.                           - Foreign bodies: 2 keys, brush, jump drive and  amulet were found in the stomach. Removal of 5                            gastric foreign bodies was successful.                           - Normal duodenal bulb and second portion of the                            duodenum. Moderate Sedation:      Moderate (conscious) sedation was administered by the endoscopy nurse       and supervised by the endoscopist. The following parameters were       monitored: oxygen saturation, heart rate, blood pressure, respiratory       rate, EKG, adequacy of pulmonary ventilation, and response to care.       Total physician intraservice time was 27 minutes. Recommendation:            - Remain in ICU for ongoing care.                           - NPO today.                           - Perform a flat plate abdominal x-ray today.                           - PPI daily for 2 months for mild esophagitis. Procedure Code(s):        --- Professional ---                           [phone], Esophagogastroduodenoscopy, flexible,                            transoral; with removal of foreign body(s)                           99153, Moderate sedation; each additional 15                            minutes intraservice time                           G0500, Moderate sedation services provided by the                            same physician or other qualified health care                            professional performing a gastrointestinal                            endoscopic service that sedation supports,                            requiring the presence of an independent trained  observer to assist in the monitoring of the                            patient's level of consciousness and physiological                            status; initial 15 minutes of intra-service time;                            patient age 37 years or older (additional time may                            be reported with [phone], as appropriate) Diagnosis Code(s):        --- Professional ---                           K21.0, Gastro-esophageal reflux disease with                            esophagitis                           T18.2XXA, Foreign body in stomach, initial encounter CPT copyright 2018 [employer]. All rights reserved. The codes documented in this report are preliminary and upon coder review may  be revised to meet current compliance requirements. Meryl DareMalcolm T Billye Pickerel, MD 02/18/2018 4:22:35 PM This report has been signed electronically. Number of Addenda: 0

## 2018-02-18 NOTE — Progress Notes (Signed)
ABG results given to MD. Rate increased to 26.

## 2018-02-19 ENCOUNTER — Encounter (HOSPITAL_COMMUNITY): Payer: Self-pay | Admitting: Gastroenterology

## 2018-02-19 ENCOUNTER — Other Ambulatory Visit (HOSPITAL_COMMUNITY): Payer: Self-pay

## 2018-02-19 DIAGNOSIS — J9621 Acute and chronic respiratory failure with hypoxia: Secondary | ICD-10-CM

## 2018-02-19 DIAGNOSIS — T189XXA Foreign body of alimentary tract, part unspecified, initial encounter: Secondary | ICD-10-CM

## 2018-02-19 DIAGNOSIS — T182XXA Foreign body in stomach, initial encounter: Secondary | ICD-10-CM

## 2018-02-19 LAB — RENAL FUNCTION PANEL
Albumin: 2.9 g/dL — ABNORMAL LOW (ref 3.5–5.0)
Anion gap: 7 (ref 5–15)
BUN: 19 mg/dL (ref 6–20)
CO2: 23 mmol/L (ref 22–32)
Calcium: 8.2 mg/dL — ABNORMAL LOW (ref 8.9–10.3)
Chloride: 109 mmol/L (ref 98–111)
Creatinine, Ser: 1.73 mg/dL — ABNORMAL HIGH (ref 0.61–1.24)
GFR calc Af Amer: >60 mL/min (ref >60)
GFR calc non Af Amer: 53 mL/min — ABNORMAL LOW (ref >60)
Glucose, Bld: 91 mg/dL (ref 70–99)
Phosphorus: 4.2 mg/dL (ref 2.5–4.6)
Potassium: 3.9 mmol/L (ref 3.5–5.1)
Sodium: 139 mmol/L (ref 135–145)

## 2018-02-19 LAB — CBC
HCT: 37.2 % — ABNORMAL LOW (ref 39.0–52.0)
Hemoglobin: 12.2 g/dL — ABNORMAL LOW (ref 13.0–17.0)
MCH: 29.5 pg (ref 26.0–34.0)
MCHC: 32.8 g/dL (ref 30.0–36.0)
MCV: 89.9 fL (ref 80.0–100.0)
PLATELETS: 199 10*3/uL (ref 150–400)
RBC: 4.14 MIL/uL — ABNORMAL LOW (ref 4.22–5.81)
RDW: 12.5 % (ref 11.5–15.5)
WBC: 7.5 10*3/uL (ref 4.0–10.5)
nRBC: 0 % (ref 0.0–0.2)

## 2018-02-19 LAB — GLUCOSE, CAPILLARY
GLUCOSE-CAPILLARY: 127 mg/dL — AB (ref 70–99)
Glucose-Capillary: 75 mg/dL (ref 70–99)
Glucose-Capillary: 99 mg/dL (ref 70–99)
Glucose-Capillary: 166 mg/dL — ABNORMAL HIGH (ref 70–99)

## 2018-02-19 LAB — HEPATITIS PANEL, ACUTE
HCV Ab: <0.1 s/co ratio (ref 0.0–0.9)
Hep A IgM: NEGATIVE
Hep B C IgM: NEGATIVE
Hepatitis B Surface Ag: NEGATIVE

## 2018-02-19 MED ORDER — ENOXAPARIN SODIUM 40 MG/0.4ML ~~LOC~~ SOLN
40.0000 mg | SUBCUTANEOUS | Status: DC
  Administered 2018-02-19 – 2018-02-24 (×4): 40 mg via SUBCUTANEOUS
  Filled 2018-02-19 (×6): qty 0.4

## 2018-02-19 MED ORDER — KCL IN DEXTROSE-NACL 20-5-0.45 MEQ/L-%-% IV SOLN
INTRAVENOUS | Status: DC
  Administered 2018-02-19 – 2018-02-20 (×3): via INTRAVENOUS
  Filled 2018-02-19 (×3): qty 1000

## 2018-02-19 NOTE — Progress Notes (Signed)
Pt. vent settings changed to PS/CPAP 5/10 by Dr. Kendrick Fries. RT notified.

## 2018-02-19 NOTE — Progress Notes (Signed)
NAME:  Mario Wyatt, MRN:  [id number], DOB:  09-12-1990, LOS: 1 ADMISSION DATE:  02/17/2018, CONSULTATION DATE:  02/18/2018 REFERRING MD:  [facility] EDP, CHIEF COMPLAINT:  confusion   Brief History   28 y/o male intubated for sudden onset change in mental status after several weeks of progressively bizarre behavior.    Past Medical History  Anxiety, arthritis, ADHD, cardiomyopathy, GERD, macular degeneration, pacemaker in place  Significant Hospital Events   2/17 admitted  Consults:  GI 2/18  Procedures:  ETT 2/18 >  Significant Diagnostic Tests:  KUB 2/17 > Metallic keys and rectangular electronic object overlying the proximal stomach, which could reflect ingested foreign bodies CT abdomen/pelvis 2/18 > Pulmonary consolidation at the right lung base with air bronchograms. Right basilar atelectasis and/or pneumonia suspected. Metallic streak artifacts emanating from the expected location of the stomach consistent with foreign body ingestion. No free air. CT maxillofacial 2/18 > Nondisplaced fracture of the lateral wall of the left maxillary sinus along its inferior aspect extending caudad and involving the posterior left maxilla. Soft tissue densities in the left nasal passage and partially opacifying the left ethmoid sinus. Moderate diffuse scalp soft tissue swelling with forehead hematoma better visualized on the head CT. CT head/Cspine 2/18 > No acute intracranial abnormality. C5-6 central to right central disc bulge impressing upon the thecal sac. Mild central canal stenosis at this level. EGD on 2/18 > keys and USB drive removed from stomach  Micro Data:  Sputum 2/18 > Blood 2/18 >  Antimicrobials:    Interim history/subjective:  Hypotensive Had ED Renal function worse today Low grade temp overnight   Objective   Blood pressure (!) 81/54, pulse 62, temperature 97.9 F (36.6 C), temperature source Oral, resp. rate (!) 26, height 5\' 4"  (1.626 m), weight 52.3 kg,  SpO2 97 %.    Vent Mode: PRVC FiO2 (%):  [30 %] 30 % Set Rate:  [26 bmp] 26 bmp Vt Set:  [450 mL] 450 mL PEEP:  [5 cmH20] 5 cmH20 Plateau Pressure:  [13 cmH20-16 cmH20] 13 cmH20   Intake/Output Summary (Last 24 hours) at 02/19/2018 0900 Last data filed at 02/19/2018 0800 Gross per 24 hour  Intake 1748.33 ml  Output 1965 ml  Net -216.67 ml   Filed Weights   02/17/18 2150 02/19/18 0418  Weight: 55.5 kg 52.3 kg    Examination:  General:  In bed on vent HENT: Some facial swelling, hard neck brace in place ETT in place PULM: CTA B, vent supported breathing CV: RRR, no mgr GI: BS+, soft, nontender MSK: normal bulk and tone Neuro: sedated on vent    Resolved Hospital Problem list     Assessment & Plan:  Acute psychosis: unclear etiology, concern for possible drug toxicity, narcotic effect? 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 > Tele  C5-6 central disc bulge > appreciate neurosurgery recs > will need outpatient f/u > maintain hard collar  Foreign body ingestion: removed by EGD  > advance diet if etubated  AKI: Volume deplete > increase IV fluids > monitor UOP  Hypoglycemia > dextrose in fluids > advance diet is fextubated  L maxillary sinus lateral wall fracture > monitor  Acute respiratory failure due to inability to protect airway >wean to extubate today > check cuff leak   Best practice:  Diet: advance after extubation Pain/Anxiety/Delirium protocol (if indicated): d/c VAP protocol (if indicated): yes DVT prophylaxis: scd, start lovenox GI prophylaxis: d/c after extubation  Glucose control: monitor Mobility: out of bed if extubated Code Status: full Family Communication: updated brother Loistine Chance on 2/19 by phone Disposition: remain in ICU  Labs   CBC: Recent Labs  Lab 02/17/18 2218 02/18/18 0037 02/18/18 0358 02/18/18 0426 02/18/18 1052 02/19/18 0337  WBC 9.3  --   --  9.1  --  7.5  NEUTROABS 7.4   --   --   --   --   --   HGB 14.5 13.6 12.2* 13.3 13.3 12.2*  HCT 43.2 40.0 36.0* 39.7 39.0 37.2*  MCV 88.9  --   --  89.6  --  89.9  PLT 259  --   --  234  --  199    Basic Metabolic Panel: Recent Labs  Lab 02/17/18 2218  02/18/18 0136 02/18/18 0358 02/18/18 0426 02/18/18 1052 02/19/18 0337  NA 136   < > 138 138 138 139 139  K 3.5   < > 3.9 4.0 4.3 3.8 3.9  CL 101  --  106  --  106  --  109  CO2 23  --  24  --  21*  --  23  GLUCOSE 80  --  109*  --  90  --  91  BUN 14  --  14  --  15  --  19  CREATININE 1.28*  --  1.22  --  1.35*  --  1.73*  CALCIUM 9.6  --  8.4*  --  8.7*  --  8.2*  MG  --   --   --   --  2.7*  --   --   PHOS  --   --   --   --  5.0*  --  4.2   < > = values in this interval not displayed.   GFR: Estimated Creatinine Clearance: 47 mL/min (A) (by C-G formula based on SCr of 1.73 mg/dL (H)). Recent Labs  Lab 02/17/18 2218 02/18/18 0426 02/19/18 0337  WBC 9.3 9.1 7.5    Liver Function Tests: Recent Labs  Lab 02/17/18 2218 02/18/18 0136 02/19/18 0337  AST 62* 62*  --   ALT 26 26  --   ALKPHOS 56 51  --   BILITOT 1.2 1.2  --   PROT 7.3 6.5  --   ALBUMIN 4.2 3.5 2.9*   No results for input(s): LIPASE, AMYLASE in the last 168 hours. No results for input(s): AMMONIA in the last 168 hours.  ABG    Component Value Date/Time   PHART 7.393 02/18/2018 1052   PCO2ART 36.9 02/18/2018 1052   PO2ART 96.0 02/18/2018 1052   HCO3 22.6 02/18/2018 1052   TCO2 24 02/18/2018 1052   ACIDBASEDEF 2.0 02/18/2018 1052   O2SAT 98.0 02/18/2018 1052     Coagulation Profile: No results for input(s): INR, PROTIME in the last 168 hours.  Cardiac Enzymes: No results for input(s): CKTOTAL, CKMB, CKMBINDEX, TROPONINI in the last 168 hours.  HbA1C: No results found for: HGBA1C  CBG: Recent Labs  Lab 02/18/18 1537 02/18/18 1959 02/18/18 2333 02/19/18 0324 02/19/18 0733  GLUCAP 75 89 67* 75 99     Critical care time: 35 minutes    Heber Malin,  MD [place] [facility] Pager: [phone] Cell: [phone] If no response, call [phone]

## 2018-02-19 NOTE — Procedures (Signed)
Extubation Procedure Note  Patient Details:   Name: Mario Wyatt DOB: March 31, 1990 MRN: [id number]   Airway Documentation:    Vent end date: 02/19/18 Vent end time: 1000   Evaluation  O2 sats: stable throughout Complications: No apparent complications Patient did tolerate procedure well. Bilateral Breath Sounds: Clear   Yes pt able to vocalize.   Pt extubated per MD order at this time. Pt was placed on 2L [place] and tolerating well. Pt was able to breathe around deflated cuff. Pt has adequate cough.   Loyal Jacobson [facility] 02/19/2018, 10:04 AM

## 2018-02-19 NOTE — Consult Note (Signed)
Reason for Consult: C5-6 central to right central disc bulge impressing upon the thecal sac. Mild central canal stenosis at this level. Referring Physician: Posey BoyerBrooke Simpson, NP  Ernst Bowlerhristopher B Wyatt is an 28 y.o. male.  HPI: Mr. Pryor CuriaDeaver was admitted on 02/18/2018 following an acute onset of psychosis. He was subsequently sedated and intubated in the ED for combativeness. He reportedly was beating his head against a wall when police arrived at his apartment. CT scans revealed facial fractures and foreign body ingestion. A C5-6 bulging disc was also identified. Neurosurgery was consulted for management of the patient's c-spine. CT head was negative for any acute intracranial abnormality.   Past Medical History:  Diagnosis Date  . Anxiety   . Arthritis    "wrists, knees; probably all over my body" (04/05/2017)  . Asthma   . Attention deficit hyperactivity disorder (ADHD)    pt denies this hx on 04/05/2017  . Cardiomyopathy (HCC)   . Chronic back pain    "right neck to lower back; shoulders" (04/05/2017)  . Congenital complete AV block   . Daily headache   . GERD (gastroesophageal reflux disease)   . Macular degeneration    bilateral  . Migraine    "a couple/month" (04/05/2017)  . Pacemaker    Placed when pt was 28 years old.  . S/P cardiac pacemaker procedure, 09/08/14, [employer] 09/09/2014  . Wears glasses     Past Surgical History:  Procedure Laterality Date  . CARDIAC PACEMAKER PLACEMENT  2007   second degree heart block  . EP IMPLANTABLE DEVICE N/A 09/08/2014   Procedure: Pacemaker Implant;  Surgeon: Duke SalviaSteven C Klein, MD;  [facility];  Service: Cardiovascular;  Laterality: N/A;  . HERNIA REPAIR    . PACEMAKER INSERTION  1999  . PACEMAKER LEAD REMOVAL N/A 04/04/2017   Procedure: LEAD EXTRACTION AND NEW LEAD PLACEMENT AND CHANGE OUT OF OLD PACEMAKER;  Surgeon: Marinus Mawaylor, Gregg W, MD;  Location: [facility] OR;  Service: Cardiovascular;  Laterality: N/A;  . PERMANENT PACEMAKER GENERATOR  CHANGE  04/04/2017   LEAD EXTRACTION AND NEW LEAD PLACEMENT AND CHANGE OUT OF OLD PACEMAKER  . UMBILICAL HERNIA REPAIR     "when I was a child"    Family History  Problem Relation Age of Onset  . Hypertension Mother   . Diabetes Mother   . Bipolar disorder Mother   . Drug abuse Mother   . Glaucoma Father   . Anesthesia problems Neg Hx   . Hypotension Neg Hx   . Malignant hyperthermia Neg Hx   . Pseudochol deficiency Neg Hx     Social History:  reports that he quit smoking about 4 years ago. His smoking use included cigarettes. He has a 10.00 pack-year smoking history. He has never used smokeless tobacco. He reports previous alcohol use. He reports that he does not use drugs.  Allergies:  Allergies  Allergen Reactions  . Iodine Anaphylaxis  . Shellfish Allergy Anaphylaxis  . Latex Hives and Rash    Medications: I have reviewed the patient's current medications.  Results for orders placed or performed during the hospital encounter of 02/17/18 (from the past 48 hour(s))  Comprehensive metabolic panel     Status: Abnormal   Collection Time: 02/17/18 10:18 PM  Result Value Ref Range   Sodium 136 135 - 145 mmol/L   Potassium 3.5 3.5 - 5.1 mmol/L   Chloride 101 98 - 111 mmol/L   CO2 23 22 - 32 mmol/L   Glucose,  Bld 80 70 - 99 mg/dL   BUN 14 6 - 20 mg/dL   Creatinine, Ser 0.09 (H) 0.61 - 1.24 mg/dL   Calcium 9.6 8.9 - 38.1 mg/dL   Total Protein 7.3 6.5 - 8.1 g/dL   Albumin 4.2 3.5 - 5.0 g/dL   AST 62 (H) 15 - 41 U/L   ALT 26 0 - 44 U/L   Alkaline Phosphatase 56 38 - 126 U/L   Total Bilirubin 1.2 0.3 - 1.2 mg/dL   GFR calc non Af Amer >60 >60 mL/min   GFR calc Af Amer >60 >60 mL/min   Anion gap 12 5 - 15    Comment: Performed at [facility], [address] [place], [place] [address]  CBC with Differential     Status: None   Collection Time: 02/17/18 10:18 PM  Result Value Ref Range   WBC 9.3 4.0 - 10.5 K/uL   RBC 4.86 4.22 - 5.81 MIL/uL   Hemoglobin 14.5 13.0  - 17.0 g/dL   HCT 71.6 96.7 - 89.3 %   MCV 88.9 80.0 - 100.0 fL   MCH 29.8 26.0 - 34.0 pg   MCHC 33.6 30.0 - 36.0 g/dL   RDW 81.0 17.5 - 10.2 %   Platelets 259 150 - 400 K/uL   nRBC 0.0 0.0 - 0.2 %   Neutrophils Relative % 81 %   Neutro Abs 7.4 1.7 - 7.7 K/uL   Lymphocytes Relative 11 %   Lymphs Abs 1.0 0.7 - 4.0 K/uL   Monocytes Relative 8 %   Monocytes Absolute 0.8 0.1 - 1.0 K/uL   Eosinophils Relative 0 %   Eosinophils Absolute 0.0 0.0 - 0.5 K/uL   Basophils Relative 0 %   Basophils Absolute 0.0 0.0 - 0.1 K/uL   Immature Granulocytes 0 %   Abs Immature Granulocytes 0.04 0.00 - 0.07 K/uL    Comment: Performed at [facility], [address], [place], [place] [address]  Acetaminophen level     Status: Abnormal   Collection Time: 02/17/18 10:18 PM  Result Value Ref Range   Acetaminophen (Tylenol), Serum <10 (L) 10 - 30 ug/mL    Comment: (NOTE) Therapeutic concentrations vary significantly. A range of 10-30 ug/mL  may be an effective concentration for many patients. However, some  are best treated at concentrations outside of this range. Acetaminophen concentrations >150 ug/mL at 4 hours after ingestion  and >50 ug/mL at 12 hours after ingestion are often associated with  toxic reactions. Performed at [facility], [address], [place], [place] [address]   Salicylate level     Status: None   Collection Time: 02/17/18 10:18 PM  Result Value Ref Range   Salicylate Lvl <7.0 2.8 - 30.0 mg/dL    Comment: Performed at [facility], [address] [place], [place] [address]  I-Stat Troponin, ED (not at [facility])     Status: None   Collection Time: 02/17/18 10:22 PM  Result Value Ref Range   Troponin i, poc 0.03 0.00 - 0.08 ng/mL   Comment 3            Comment: Due to the release kinetics of cTnI, a negative result within the first hours of the onset of symptoms does not rule out myocardial infarction with certainty. If myocardial infarction is still  suspected, repeat the test at appropriate intervals.   Rapid HIV screen (HIV 1/2 Ab+Ag)     Status: None  Collection Time: 02/17/18 11:43 PM  Result Value Ref Range   HIV-1 P24 Antigen - HIV24 NON REACTIVE NON REACTIVE   HIV 1/2 Antibodies NON REACTIVE NON REACTIVE   Interpretation (HIV Ag Ab)      A non reactive test result means that HIV 1 or HIV 2 antibodies and HIV 1 p24 antigen were not detected in the specimen.    Comment: Performed at [facility], [address] [place], [place] [address]  Hepatitis panel, acute     Status: None   Collection Time: 02/17/18 11:43 PM  Result Value Ref Range   Hepatitis B Surface Ag Negative Negative   HCV Ab <0.1 0.0 - 0.9 s/co ratio    Comment: (NOTE)                                  Negative:     < 0.8                             Indeterminate: 0.8 - 0.9                                  Positive:     > 0.9 The CDC recommends that a positive HCV antibody result be followed up with a HCV Nucleic Acid Amplification test (578469). Performed At: [facility] [address] [place], [place] [id number] Jolene Schimke MD KG:[id number]    Hep A IgM Negative Negative   Hep B C IgM Negative Negative  I-STAT 7, (LYTES, BLD GAS, ICA, H+H)     Status: Abnormal   Collection Time: 02/18/18 12:37 AM  Result Value Ref Range   pH, Arterial 7.050 (LL) 7.350 - 7.450   pCO2 arterial 77.6 (HH) 32.0 - 48.0 mmHg   pO2, Arterial 207.0 (H) 83.0 - 108.0 mmHg   Bicarbonate 21.4 20.0 - 28.0 mmol/L   TCO2 24 22 - 32 mmol/L   O2 Saturation 99.0 %   Acid-base deficit 10.0 (H) 0.0 - 2.0 mmol/L   Sodium 139 135 - 145 mmol/L   Potassium 3.9 3.5 - 5.1 mmol/L   Calcium, Ion 1.24 1.15 - 1.40 mmol/L   HCT 40.0 39.0 - 52.0 %   Hemoglobin 13.6 13.0 - 17.0 g/dL   Patient temperature 64.4 F    Collection site BRACHIAL ARTERY    Drawn by Operator    Sample type ARTERIAL    Comment NOTIFIED PHYSICIAN   Comprehensive metabolic panel     Status: Abnormal    Collection Time: 02/18/18  1:36 AM  Result Value Ref Range   Sodium 138 135 - 145 mmol/L   Potassium 3.9 3.5 - 5.1 mmol/L   Chloride 106 98 - 111 mmol/L   CO2 24 22 - 32 mmol/L   Glucose, Bld 109 (H) 70 - 99 mg/dL   BUN 14 6 - 20 mg/dL   Creatinine, Ser 0.34 0.61 - 1.24 mg/dL   Calcium 8.4 (L) 8.9 - 10.3 mg/dL   Total Protein 6.5 6.5 - 8.1 g/dL   Albumin 3.5 3.5 - 5.0 g/dL   AST 62 (H) 15 - 41 U/L   ALT 26 0 - 44 U/L   Alkaline Phosphatase 51 38 - 126 U/L   Total Bilirubin 1.2 0.3 - 1.2 mg/dL   GFR calc non Af  Amer >60 >60 mL/min   GFR calc Af Amer >60 >60 mL/min   Anion gap 8 5 - 15    Comment: Performed at [facility], [address], [place], [place] [address]  I-STAT 7, (LYTES, BLD GAS, ICA, H+H)     Status: Abnormal   Collection Time: 02/18/18  3:58 AM  Result Value Ref Range   pH, Arterial 7.356 7.350 - 7.450   pCO2 arterial 42.9 32.0 - 48.0 mmHg   pO2, Arterial 204.0 (H) 83.0 - 108.0 mmHg   Bicarbonate 24.2 20.0 - 28.0 mmol/L   TCO2 26 22 - 32 mmol/L   O2 Saturation 100.0 %   Acid-base deficit 2.0 0.0 - 2.0 mmol/L   Sodium 138 135 - 145 mmol/L   Potassium 4.0 3.5 - 5.1 mmol/L   Calcium, Ion 1.18 1.15 - 1.40 mmol/L   HCT 36.0 (L) 39.0 - 52.0 %   Hemoglobin 12.2 (L) 13.0 - 17.0 g/dL   Patient temperature 25.9 F    Collection site BRACHIAL ARTERY    Drawn by Operator    Sample type ARTERIAL   Osmolality     Status: None   Collection Time: 02/18/18  4:26 AM  Result Value Ref Range   Osmolality 286 275 - 295 mOsm/kg    Comment: Performed at [facility], [address], [place], [place] [address]  CBC     Status: None   Collection Time: 02/18/18  4:26 AM  Result Value Ref Range   WBC 9.1 4.0 - 10.5 K/uL   RBC 4.43 4.22 - 5.81 MIL/uL   Hemoglobin 13.3 13.0 - 17.0 g/dL   HCT 56.4 33.2 - 95.1 %   MCV 89.6 80.0 - 100.0 fL   MCH 30.0 26.0 - 34.0 pg   MCHC 33.5 30.0 - 36.0 g/dL   RDW 88.4 16.6 - 06.3 %   Platelets 234 150 - 400 K/uL   nRBC 0.0 0.0 -  0.2 %    Comment: Performed at [facility], [address], [place], [place] [address]  Basic metabolic panel     Status: Abnormal   Collection Time: 02/18/18  4:26 AM  Result Value Ref Range   Sodium 138 135 - 145 mmol/L   Potassium 4.3 3.5 - 5.1 mmol/L   Chloride 106 98 - 111 mmol/L   CO2 21 (L) 22 - 32 mmol/L   Glucose, Bld 90 70 - 99 mg/dL   BUN 15 6 - 20 mg/dL   Creatinine, Ser 0.93 (H) 0.61 - 1.24 mg/dL   Calcium 8.7 (L) 8.9 - 10.3 mg/dL   GFR calc non Af Amer >60 >60 mL/min   GFR calc Af Amer >60 >60 mL/min   Anion gap 11 5 - 15    Comment: Performed at [facility], [address], [place], [place] [address]  Magnesium     Status: Abnormal   Collection Time: 02/18/18  4:26 AM  Result Value Ref Range   Magnesium 2.7 (H) 1.7 - 2.4 mg/dL    Comment: Performed at [facility], [address], [place], [place] [address]  Phosphorus     Status: Abnormal   Collection Time: 02/18/18  4:26 AM  Result Value Ref Range   Phosphorus 5.0 (H) 2.5 - 4.6 mg/dL    Comment: Performed at [facility], [address] [place], [place] [address]  Triglycerides     Status: None   Collection Time: 02/18/18  4:26 AM  Result Value Ref Range   Triglycerides 100 <150 mg/dL    Comment: Performed at [facility], [address] [place], [place] [address]  Glucose, capillary     Status: Abnormal   Collection Time: 02/18/18  8:10 AM  Result Value Ref Range   Glucose-Capillary 67 (L) 70 - 99 mg/dL  MRSA PCR Screening     Status: None   Collection Time: 02/18/18  8:13 AM  Result Value Ref Range   MRSA by PCR NEGATIVE NEGATIVE    Comment:        The GeneXpert MRSA Assay (FDA approved for NASAL specimens only), is one component of a comprehensive MRSA colonization surveillance program. It is not intended to diagnose MRSA infection nor to guide or monitor treatment for MRSA infections. Performed at [facility], [address] [place], [place] [address]    Rapid urine drug screen (hospital performed)     Status: Abnormal   Collection Time: 02/18/18  8:24 AM  Result Value Ref Range   Opiates NONE DETECTED NONE DETECTED   Cocaine NONE DETECTED NONE DETECTED   Benzodiazepines POSITIVE (A) NONE DETECTED   Amphetamines NONE DETECTED NONE DETECTED   Tetrahydrocannabinol NONE DETECTED NONE DETECTED   Barbiturates NONE DETECTED NONE DETECTED    Comment: (NOTE) DRUG SCREEN FOR MEDICAL PURPOSES ONLY.  IF CONFIRMATION IS NEEDED FOR ANY PURPOSE, NOTIFY LAB WITHIN 5 DAYS. LOWEST DETECTABLE LIMITS FOR URINE DRUG SCREEN Drug Class                     Cutoff (ng/mL) Amphetamine and metabolites    1000 Barbiturate and metabolites    200 Benzodiazepine                 200 Tricyclics and metabolites     300 Opiates and metabolites        300 Cocaine and metabolites        300 THC                            50 Performed at [facility], [address] [place], [place] [address]   Glucose, capillary     Status: Abnormal   Collection Time: 02/18/18  8:45 AM  Result Value Ref Range   Glucose-Capillary 188 (H) 70 - 99 mg/dL  I-STAT 7, (LYTES, BLD GAS, ICA, H+H)     Status: None   Collection Time: 02/18/18 10:52 AM  Result Value Ref Range   pH, Arterial 7.393 7.350 - 7.450   pCO2 arterial 36.9 32.0 - 48.0 mmHg   pO2, Arterial 96.0 83.0 - 108.0 mmHg   Bicarbonate 22.6 20.0 - 28.0 mmol/L   TCO2 24 22 - 32 mmol/L   O2 Saturation 98.0 %   Acid-base deficit 2.0 0.0 - 2.0 mmol/L   Sodium 139 135 - 145 mmol/L   Potassium 3.8 3.5 - 5.1 mmol/L   Calcium, Ion 1.18 1.15 - 1.40 mmol/L   HCT 39.0 39.0 - 52.0 %   Hemoglobin 13.3 13.0 - 17.0 g/dL   Patient temperature 91.4 F    Collection site RADIAL, ALLEN'S TEST ACCEPTABLE    Drawn by RT    Sample type ARTERIAL   Glucose, capillary     Status: None   Collection Time: 02/18/18 12:07 PM  Result Value Ref Range   Glucose-Capillary 72 70 - 99 mg/dL  Glucose, capillary  Status: Abnormal    Collection Time: 02/18/18 12:41 PM  Result Value Ref Range   Glucose-Capillary 67 (L) 70 - 99 mg/dL  Glucose, capillary     Status: None   Collection Time: 02/18/18  3:37 PM  Result Value Ref Range   Glucose-Capillary 75 70 - 99 mg/dL  Glucose, capillary     Status: None   Collection Time: 02/18/18  7:59 PM  Result Value Ref Range   Glucose-Capillary 89 70 - 99 mg/dL  Glucose, capillary     Status: Abnormal   Collection Time: 02/18/18 11:33 PM  Result Value Ref Range   Glucose-Capillary 67 (L) 70 - 99 mg/dL  Glucose, capillary     Status: None   Collection Time: 02/19/18  3:24 AM  Result Value Ref Range   Glucose-Capillary 75 70 - 99 mg/dL  CBC     Status: Abnormal   Collection Time: 02/19/18  3:37 AM  Result Value Ref Range   WBC 7.5 4.0 - 10.5 K/uL   RBC 4.14 (L) 4.22 - 5.81 MIL/uL   Hemoglobin 12.2 (L) 13.0 - 17.0 g/dL   HCT 07.6 (L) 80.8 - 81.1 %   MCV 89.9 80.0 - 100.0 fL   MCH 29.5 26.0 - 34.0 pg   MCHC 32.8 30.0 - 36.0 g/dL   RDW 03.1 59.4 - 58.5 %   Platelets 199 150 - 400 K/uL   nRBC 0.0 0.0 - 0.2 %    Comment: Performed at [facility], [address] [place], [place] [address]  Renal function panel     Status: Abnormal   Collection Time: 02/19/18  3:37 AM  Result Value Ref Range   Sodium 139 135 - 145 mmol/L   Potassium 3.9 3.5 - 5.1 mmol/L   Chloride 109 98 - 111 mmol/L   CO2 23 22 - 32 mmol/L   Glucose, Bld 91 70 - 99 mg/dL   BUN 19 6 - 20 mg/dL   Creatinine, Ser 4.62 (H) 0.61 - 1.24 mg/dL   Calcium 8.2 (L) 8.9 - 10.3 mg/dL   Phosphorus 4.2 2.5 - 4.6 mg/dL   Albumin 2.9 (L) 3.5 - 5.0 g/dL   GFR calc non Af Amer 53 (L) >60 mL/min   GFR calc Af Amer >60 >60 mL/min   Anion gap 7 5 - 15    Comment: Performed at [facility], [address] [place], [place] [address]  Glucose, capillary     Status: None   Collection Time: 02/19/18  7:33 AM  Result Value Ref Range   Glucose-Capillary 99 70 - 99 mg/dL    Ct Abdomen Pelvis Wo  Contrast  Result Date: 02/18/2018 CLINICAL DATA:  Foreign body ingestion (keys) as seen on abdomen radiographs EXAM: CT ABDOMEN AND PELVIS WITHOUT CONTRAST TECHNIQUE: Multidetector CT imaging of the abdomen and pelvis was performed following the standard protocol without IV contrast. COMPARISON:  Same day abdomen radiographs FINDINGS: Lower chest: Pulmonary consolidation at the right lung base with air bronchograms. Right basilar atelectasis and/or pneumonia suspected. The left lung is clear. Hepatobiliary: No focal liver abnormality is seen. No gallstones, gallbladder wall thickening, or biliary dilatation. Pancreas: Unremarkable. No pancreatic ductal dilatation or surrounding inflammatory changes. Spleen: Normal in size without focal abnormality. Adrenals/Urinary Tract: The left adrenal gland and upper pole of kidney are obscured by metallic streak artifacts from the patient's foreign body ingestion. No obstructive uropathy or nephrolithiasis. The urinary bladder is unremarkable. Stomach/Bowel: Metallic streak artifacts consistent with patient's  foreign body ingestion is identified along the posterior wall of the expected stomach. Gastric tube extends into the stomach. No bowel obstruction. Paucity of mesenteric fat limits further assessment. Vascular/Lymphatic: No significant vascular findings are present. No enlarged abdominal or pelvic lymph nodes. Reproductive: Prostate is unremarkable. Other: No abdominal wall hernia or abnormality. No abdominopelvic ascites. Musculoskeletal: No acute or significant osseous findings. IMPRESSION: 1. Pulmonary consolidation at the right lung base with air bronchograms. Right basilar atelectasis and/or pneumonia suspected. 2. Metallic streak artifacts emanating from the expected location of the stomach consistent with foreign body ingestion. No free air. Electronically Signed   By: Tollie Eth M.D.   On: 02/18/2018 02:15   Dg Abd 1 View  Result Date: 02/18/2018 CLINICAL  DATA:  Post EGD. Removal of gastric foreign bodies. Confirm removal of foreign bodies. EXAM: ABDOMEN - 1 VIEW COMPARISON:  02/17/2018 radiographs FINDINGS: The metallic foreign bodies projecting over the stomach are no longer present. Moderate stool retention is seen within the left colon. No free air is identified. Heart size is normal with right basilar consolidation, slightly improved in appearance. IMPRESSION: No retained unaccountable metallic radiopaque foreign body. No free air. Electronically Signed   By: Tollie Eth M.D.   On: 02/18/2018 19:25   Ct Head Wo Contrast  Result Date: 02/18/2018 CLINICAL DATA:  Patient presents after having barricaded himself in his apartment and hitting his head against a wall multiple times. EXAM: CT HEAD WITHOUT CONTRAST TECHNIQUE: Contiguous axial images were obtained from the base of the skull through the vertex without intravenous contrast. COMPARISON:  None. FINDINGS: Brain: No acute intracranial hemorrhage, midline shift or edema. No hydrocephalus. No extra-axial fluid collections. Midline fourth ventricle basal cisterns without effacement. Gray-white matter distinction is maintained. Vascular: No hyperdense vessel sign Skull: Fracture of the left lateral wall of the maxillary sinus extending into the posterior left maxilla. Asymmetry of the maxilla with bony deficiency along the posterior aspect of the right maxilla relative to left possibly related to old remote trauma or dental disease and extractions. Sinuses/Orbits: Moderate mucosal thickening of the maxillary sinuses with air-fluid level on the right. Soft tissue densities in the left nasal passage with ethmoid sinus mucosal thickening. The sphenoid sinus is clear. The frontal sinus is not pneumatized. Other: Hematoma of the forehead with moderate scalp and forehead soft tissue swelling noted. Hypodensity at the base of the tongue is again noted possibly a thyroglossal duct cyst. IMPRESSION: 1. No acute  intracranial abnormality. 2. Fracture of the left lateral wall of the maxillary sinus extending into the posterior left maxilla. Bony deficiency along the posterior aspect of the right maxilla relative to left possibly related to old trauma, dental disease and/or extractions. 3. Hematoma of the forehead with moderate scalp and forehead soft tissue swelling. Electronically Signed   By: Tollie Eth M.D.   On: 02/18/2018 02:09   Ct Cervical Spine Wo Contrast  Result Date: 02/18/2018 CLINICAL DATA:  Recent tooth extractions for which the patient typical out of pain meds. Patient began banging his head against a wall multiple times and presents with bruising beneath both eyes and swelling of the forehead. EXAM: CT CERVICAL SPINE WITHOUT CONTRAST TECHNIQUE: Multidetector CT imaging of the cervical spine was performed without intravenous contrast. Multiplanar CT image reconstructions were also generated. COMPARISON:  07/30/2016 FINDINGS: Alignment: Maintained cervical lordosis Skull base and vertebrae: Intact Soft tissues and spinal canal: The patient is intubated. A gastric tube is seen coiled in the oropharynx before projecting caudad into  the esophagus. The tip is excluded on this study. Disc levels: C5-6 central to right central disc bulge impressing upon the thecal sac. Mild central canal stenosis at this level. No jumped or perched facets. No significant foraminal encroachment. Upper chest: Negative Other: Redemonstration of a cyst at the base of the tongue possibly representing a thyroglossal duct cyst. This measures at least 2.5 cm on the sagittal view. IMPRESSION: 1. C5-6 central to right central disc bulge impressing upon the thecal sac. Mild central canal stenosis at this level. 2. The patient's gastric tube is coiled in the oropharynx before projecting caudad into the esophagus. The tip is excluded on this study. 3. No acute cervical spine fracture. Electronically Signed   By: Tollie Ethavid  Kwon M.D.   On:  02/18/2018 02:27   Dg Hand 2 View Left  Result Date: 02/18/2018 CLINICAL DATA:  Initial evaluation for acute injury, hit head on wall, pain. EXAM: LEFT HAND - 2 VIEW COMPARISON:  None. FINDINGS: Single oblique view of the left hand demonstrates no acute fracture or dislocation. Joint spaces maintained. Soft tissue irregularity at the ulnar aspect of the mid left hand, suggesting small soft tissue injury. No radiopaque foreign body. IMPRESSION: 1. No acute osseous abnormality. 2. Focal soft tissue irregularity at the ulnar aspect of the left hand, suggesting soft tissue injury. No radiopaque foreign body. Electronically Signed   By: Rise MuBenjamin  McClintock M.D.   On: 02/18/2018 00:37   Dg Chest Portable 1 View  Result Date: 02/18/2018 CLINICAL DATA:  Initial evaluation for acute injury, struck head on wall. EXAM: PORTABLE CHEST 1 VIEW COMPARISON:  Prior radiograph from 04/05/2017 FINDINGS: Endotracheal tube in place with tip positioned 5 cm above the carina. Enteric tube courses into the abdomen. Left-sided pacemaker/AICD in place. Cardiac and mediastinal silhouettes are stable, and remain within normal limits. Lungs mildly hypoinflated. No focal infiltrates. No edema or effusion. No pneumothorax. Visualized osseous structures within normal limits. IMPRESSION: 1. Tip of endotracheal tube position 5 cm above the carina. 2. Enteric tube courses into the upper abdomen, nonvisualization of tip or side hole. 3. Clear lungs.  No other active cardiopulmonary disease. Electronically Signed   By: Rise MuBenjamin  McClintock M.D.   On: 02/18/2018 00:09   Dg Abd Portable 1v  Result Date: 02/18/2018 CLINICAL DATA:  Initial evaluation for foreign body. EXAM: PORTABLE ABDOMEN - 1 VIEW COMPARISON:  None. FINDINGS: Enteric tube in place with tip overlying the gastric body, side hole just beyond the GE junction. Bowel gas pattern within normal limits. Moderate retained stool within the colon. Metallic keys overlie the proximal  stomach, suggestive of foreign body. Few adjacent subcentimeter linear densities. Additional adjacent rectangular electronic density, which also may reflect a foreign body. IMPRESSION: 1. Metallic keys and rectangular electronic object overlying the proximal stomach, which could reflect ingested foreign bodies. 2. Enteric tube in place with tip overlying the gastric body, side hole beyond the GE junction. 3. Nonobstructive bowel gas pattern. Electronically Signed   By: Rise MuBenjamin  McClintock M.D.   On: 02/18/2018 00:42   Dg Hand Complete Right  Result Date: 02/18/2018 CLINICAL DATA:  Initial evaluation for acute injury, hit head on wall. EXAM: RIGHT HAND - COMPLETE 3+ VIEW COMPARISON:  None. FINDINGS: There is no evidence of fracture or dislocation. There is no evidence of arthropathy or other focal bone abnormality. Mild soft tissue irregularity at the ulnar aspect of the right hand. Additionally, mild soft tissue swelling overlies the metacarpal heads. IMPRESSION: 1. No acute osseus abnormality. 2.  Mild soft tissue swelling overlying the metacarpal heads with additional soft tissue irregularity at the ulnar aspect of the hand, which could reflect acute soft tissue injury. No radiopaque foreign body. Electronically Signed   By: Rise Mu M.D.   On: 02/18/2018 00:32   Ct Maxillofacial Wo Contrast  Result Date: 02/18/2018 CLINICAL DATA:  Patient dental work a few days ago and has taken lot of pain meds. Swelling of the forehead with bruising under both eyes. EXAM: CT MAXILLOFACIAL WITHOUT CONTRAST TECHNIQUE: Multidetector CT imaging of the maxillofacial structures was performed. Multiplanar CT image reconstructions were also generated. COMPARISON:  None. FINDINGS: Osseous: No mandibular fracture or dislocation. The temporomandibular joints are maintained. The zygomatic arches are symmetric and intact. No nasal bone fracture. Mild right-sided nasal septal spur and septal deviation. Orbits: Intact  orbits. Sinuses: Nondisplaced fracture of the lateral wall of the left maxillary sinus, series 8/48 extending caudad and involving the posterior left maxilla. Multiple bilateral tooth extractions are noted involving the maxilla with defects along the posterior aspect of the maxilla bilaterally likely from molar extractions. Small air-fluid level in the right maxillary sinus. Soft tissue densities in the left nasal passage and partially opacifying the left ethmoid sinus. The frontal sinus is not pneumatized. The sphenoid sinus is clear. Mastoids are clear. Soft tissues: Moderate diffuse scalp soft tissue swelling with forehead hematoma better visualized on the head CT. Endotracheal and gastric tubes are visualized with mild coiling of the gastric tube in the oropharynx. Limited intracranial: Negative IMPRESSION: 1. Nondisplaced fracture of the lateral wall of the left maxillary sinus along its inferior aspect extending caudad and involving the posterior left maxilla. 2. Soft tissue densities in the left nasal passage and partially opacifying the left ethmoid sinus. 3. Moderate diffuse scalp soft tissue swelling with forehead hematoma better visualized on the head CT. Electronically Signed   By: Tollie Eth M.D.   On: 02/18/2018 02:23    ROS Blood pressure (!) 81/54, pulse 62, temperature 97.9 F (36.6 C), temperature source Oral, resp. rate (!) 26, height 5\' 4"  (1.626 m), weight 52.3 kg, SpO2 97 %.   Physical Exam  Assessment/Plan: Mr. Seavey neurological exam is limited at this time due to sedation. Please contact neurosurgery once patient is extubated and able to participate in an exam. There are no emergent neurosurgical issues identified at this time. Maintain cervical collar at this time.  Floreen Comber  02/19/2018, 9:23 AM

## 2018-02-19 NOTE — Evaluation (Signed)
Clinical/Bedside Swallow Evaluation Patient Details  Name: Mario Wyatt MRN: [id number] Date of Birth: 1990-06-21  Today's Date: 02/19/2018 Time: SLP Start Time (ACUTE ONLY): 1422 SLP Stop Time (ACUTE ONLY): 1440 SLP Time Calculation (min) (ACUTE ONLY): 18 min  Past Medical History:  Past Medical History:  Diagnosis Date  . Anxiety   . Arthritis    "wrists, knees; probably all over my body" (04/05/2017)  . Asthma   . Attention deficit hyperactivity disorder (ADHD)    pt denies this hx on 04/05/2017  . Cardiomyopathy (HCC)   . Chronic back pain    "right neck to lower back; shoulders" (04/05/2017)  . Congenital complete AV block   . Daily headache   . GERD (gastroesophageal reflux disease)   . Macular degeneration    bilateral  . Migraine    "a couple/month" (04/05/2017)  . Pacemaker    Placed when pt was 28 years old.  . S/P cardiac pacemaker procedure, 09/08/14, [employer] 09/09/2014  . Wears glasses    Past Surgical History:  Past Surgical History:  Procedure Laterality Date  . CARDIAC PACEMAKER PLACEMENT  2007   second degree heart block  . EP IMPLANTABLE DEVICE N/A 09/08/2014   Procedure: Pacemaker Implant;  Surgeon: Duke Salvia, MD;  Location: [facility] INVASIVE CV LAB;  Service: Cardiovascular;  Laterality: N/A;  . ESOPHAGOGASTRODUODENOSCOPY N/A 02/18/2018   Procedure: ESOPHAGOGASTRODUODENOSCOPY (EGD);  Surgeon: Meryl Dare, MD;  Location: [facility] ENDOSCOPY;  Service: Endoscopy;  Laterality: N/A;  . FOREIGN BODY REMOVAL N/A 02/18/2018   Procedure: FOREIGN BODY REMOVAL;  Surgeon: Meryl Dare, MD;  Location: [facility] ENDOSCOPY;  Service: Endoscopy;  Laterality: N/A;  . HERNIA REPAIR    . PACEMAKER INSERTION  1999  . PACEMAKER LEAD REMOVAL N/A 04/04/2017   Procedure: LEAD EXTRACTION AND NEW LEAD PLACEMENT AND CHANGE OUT OF OLD PACEMAKER;  Surgeon: Marinus Maw, MD;  Location: [facility] OR;  Service: Cardiovascular;  Laterality: N/A;  . PERMANENT PACEMAKER GENERATOR CHANGE   04/04/2017   LEAD EXTRACTION AND NEW LEAD PLACEMENT AND CHANGE OUT OF OLD PACEMAKER  . UMBILICAL HERNIA REPAIR     "when I was a child"   HPI:  28 year old male admitted with extreme agitation and psychosis. Past medical history, which is significant for asthma, congenital complete AV block, cardiomyopathy, and pacemaker.  Per chart supposedly he had dental work a few days prior to admission and had been taking pain medications as result. Pt requiring tasing due to severe combativeness. Pt intubated 2/17-2/19. Imaging revealed pt had ingested foreign body of a key and thumb drive. He underwent EGD for removal of objects. CXR Clear lungs.  No other active cardiopulmonary disease   Assessment / Plan / Recommendation Clinical Impression  Pt exhibits concern for an acute reversible pharyngeal dysphagia due to intubation although only 2 days. Overall, vocal quality is within normal range, cough moderately strong and denies odonophagia. He consistently cleared throat and coughed following thin liquid trials with airway compromise suspected. Recommend pt have applesauce, pudding and yogurt with FULL supervision due to pt's impulsivity.  He may have ice chips after oral care if he requests and ST will return for appropriate upgrade.   SLP Visit Diagnosis: Dysphagia, unspecified (R13.10)    Aspiration Risk  Mild aspiration risk;Moderate aspiration risk    Diet Recommendation Other (Comment)(pudding, applesauce yogurt consistencies, ice chips)        Other  Recommendations Oral Care Recommendations: Oral care QID   Follow up Recommendations None  Frequency and Duration min 2x/week  2 weeks       Prognosis Prognosis for Safe Diet Advancement: Good      Swallow Study   General HPI: 28 year old male admitted with extreme agitation and psychosis. Past medical history, which is significant for asthma, congenital complete AV block, cardiomyopathy, and pacemaker.  Per chart supposedly he had  dental work a few days prior to admission and had been taking pain medications as result. Pt requiring tasing due to severe combativeness. Pt intubated 2/17-2/19. Imaging revealed pt had ingested foreign body of a key and thumb drive. He underwent EGD for removal of objects. CXR Clear lungs.  No other active cardiopulmonary disease Type of Study: Bedside Swallow Evaluation Previous Swallow Assessment: none Diet Prior to this Study: NPO Temperature Spikes Noted: Yes Respiratory Status: Nasal cannula History of Recent Intubation: Yes Length of Intubations (days): 2 days Date extubated: 02/19/18 Behavior/Cognition: Alert;Cooperative;Requires cueing Oral Cavity Assessment: Other (comment)(appeared WFL's. Unable to fully view) Oral Care Completed by SLP: Recent completion by staff Oral Cavity - Dentition: Adequate natural dentition Vision: Functional for self-feeding Self-Feeding Abilities: Needs set up Patient Positioning: Upright in bed Baseline Vocal Quality: Normal Volitional Cough: (moderately strong) Volitional Swallow: Able to elicit    Oral/Motor/Sensory Function Overall Oral Motor/Sensory Function: Within functional limits   Ice Chips Ice chips: Within functional limits Presentation: Spoon   Thin Liquid Thin Liquid: Impaired Presentation: Cup;Straw Pharyngeal  Phase Impairments: Throat Clearing - Immediate;Cough - Delayed    Nectar Thick Nectar Thick Liquid: Not tested   Honey Thick Honey Thick Liquid: Not tested   Puree Puree: Within functional limits   Solid     Solid: Not tested      Royce Macadamia 02/19/2018,3:21 PM   Breck Coons Lonell Face.Ed Nurse, children's [phone] Office [phone]

## 2018-02-20 DIAGNOSIS — S0240DA Maxillary fracture, left side, initial encounter for closed fracture: Secondary | ICD-10-CM

## 2018-02-20 DIAGNOSIS — E872 Acidosis: Secondary | ICD-10-CM

## 2018-02-20 DIAGNOSIS — F209 Schizophrenia, unspecified: Principal | ICD-10-CM

## 2018-02-20 MED ORDER — PANTOPRAZOLE SODIUM 40 MG PO TBEC
40.0000 mg | DELAYED_RELEASE_TABLET | Freq: Every day | ORAL | Status: DC
  Administered 2018-02-20 – 2018-02-24 (×5): 40 mg via ORAL
  Filled 2018-02-20 (×5): qty 1

## 2018-02-20 MED ORDER — CARVEDILOL 3.125 MG PO TABS
3.1250 mg | ORAL_TABLET | Freq: Two times a day (BID) | ORAL | Status: DC
  Administered 2018-02-20 – 2018-02-24 (×8): 3.125 mg via ORAL
  Filled 2018-02-20 (×8): qty 1

## 2018-02-20 MED ORDER — ACETAMINOPHEN 325 MG PO TABS
650.0000 mg | ORAL_TABLET | Freq: Four times a day (QID) | ORAL | Status: DC | PRN
  Administered 2018-02-20 – 2018-02-24 (×10): 650 mg via ORAL
  Filled 2018-02-20 (×10): qty 2

## 2018-02-20 MED ORDER — RISPERIDONE 1 MG PO TABS
1.0000 mg | ORAL_TABLET | Freq: Two times a day (BID) | ORAL | Status: DC
  Administered 2018-02-20 – 2018-02-22 (×4): 1 mg via ORAL
  Filled 2018-02-20 (×10): qty 1

## 2018-02-20 MED ORDER — ONDANSETRON HCL 4 MG PO TABS
4.0000 mg | ORAL_TABLET | Freq: Once | ORAL | Status: AC
  Administered 2018-02-20: 4 mg via ORAL
  Filled 2018-02-20: qty 1

## 2018-02-20 NOTE — Progress Notes (Signed)
Patient extubated.  He is awake and alert.  He is somewhat blunted.  He does follow commands bilaterally.  He does not complain of significant neck pain.  He denies any radiating pain.  He denies any numbness or weakness.  On examination his strength appears to be grossly equal bilaterally.  Sensory examination nonfocal.  Reflexes are normal.  This patient does have evidence of a significant cervical disc herniation on CT scanning.  At this point I do not think there is any need for any type of urgent surgical intervention.  I think he can be switched to a soft cervical collar for comfort.  I think he may be mobilized ad lib.  I would like to see him as an outpatient to better evaluate his situation as his other medical issues subside.

## 2018-02-20 NOTE — Evaluation (Signed)
Physical Therapy Evaluation Patient Details Name: Mario Wyatt MRN: [id number] DOB: [birth year] Today's Date: 02/20/2018   History of Present Illness  Pt is a 28 yo s/p psychosis requiring tasing in ER due to combativeness. Before coming in, Pt ate USB and key requiring surgery to remove items 02/19/18. Pt with facial fractures and negative CT head. C5-6 showed bulging disc and neurosurgery will f/up OP. Pt reported numbness in L hand on ulnar side. No L hand fx on scans.    Clinical Impression  Patient admitted with the above listed diagnosis. Patient unable to state PLOF, and only able to state first name and month of DOB. Patient received in hard collar up in recliner and agreeable to participate with PT. Patient able to lift LE against gravity as well as perform weight bearing activities without knee buckle. Patient requiring consistent verbal cueing for sequencing and safety during mobility with mobility primarily limited due to pain and reports of "dizziness." Due to current functional status will recommend post-acute rehab at discharge. PT to follow acutely to maximize mobility and safety.     Follow Up Recommendations SNF;Supervision/Assistance - 24 hour    Equipment Recommendations  None recommended by PT    Recommendations for Other Services OT consult([facility] consult)     Precautions / Restrictions Precautions Precautions: Fall;Other (comment)(IVC) Required Braces or Orthoses: Cervical Brace(MD recommending soft collar;) Cervical Brace: Hard collar Restrictions Weight Bearing Restrictions: No      Mobility  Bed Mobility               General bed mobility comments: patient received in recliner  Transfers Overall transfer level: Needs assistance Equipment used: 1 person hand held assist;2 person hand held assist Transfers: Sit to/from Stand Sit to Stand: Min assist         General transfer comment: light Min A for initial steadying; requires cues to push up  from chair; moans throughout  Ambulation/Gait Ambulation/Gait assistance: Min assist;Min guard Gait Distance (Feet): 15 Feet Assistive device: 1 person hand held assist;2 person hand held assist Gait Pattern/deviations: Step-to pattern;Step-through pattern;Decreased stride length;Trunk flexed;Drifts right/left Gait velocity: decreased   General Gait Details: cueing to keep eyes open with mobility; moans throughout; very light HHA for stability, no knee buckle with weight bearing  Stairs            Wheelchair Mobility    Modified Rankin (Stroke Patients Only)       Balance Overall balance assessment: Mild deficits observed, not formally tested                                           Pertinent Vitals/Pain Pain Assessment: Faces Faces Pain Scale: Hurts even more Pain Location: head, groin region at foley site Pain Descriptors / Indicators: Aching;Discomfort;Grimacing;Guarding;Moaning Pain Intervention(s): Limited activity within patient's tolerance;Monitored during session;Repositioned    Home Living Family/patient expects to be discharged to:: Other (Comment)(IVC to behavioral health) Living Arrangements: Alone               Additional Comments: unable to assess as pt states "does not remember."    Prior Function Level of Independence: Independent               Hand Dominance        Extremity/Trunk Assessment   Upper Extremity Assessment Upper Extremity Assessment: Defer to OT evaluation LUE Sensation: decreased light touch  Lower Extremity Assessment Lower Extremity Assessment: Generalized weakness;RLE deficits/detail;LLE deficits/detail RLE Deficits / Details: able to lift against gravity; bears weight without buckle LLE Deficits / Details: able to lift against gravity; bears weight without buckle    Cervical / Trunk Assessment Cervical / Trunk Assessment: Normal  Communication   Communication: Expressive  difficulties(often moaning throughout session )  Cognition Arousal/Alertness: Awake/alert Behavior During Therapy: WFL for tasks assessed/performed;Flat affect Overall Cognitive Status: Impaired/Different from baseline Area of Impairment: Orientation;Memory;Awareness;Following commands;Problem solving                 Orientation Level: Disoriented to;Place;Time;Situation   Memory: Decreased short-term memory Following Commands: Follows one step commands consistently     Problem Solving: Slow processing;Decreased initiation;Requires verbal cues General Comments: stating that he does not remember anything      General Comments General comments (skin integrity, edema, etc.): reports dizziness in sitting and standing - able to follow pen tip without saccades or nystagmus    Exercises     Assessment/Plan    PT Assessment Patient needs continued PT services  PT Problem List Decreased strength;Decreased activity tolerance;Decreased balance;Decreased mobility;Decreased knowledge of use of DME;Decreased safety awareness       PT Treatment Interventions DME instruction;Gait training;Stair training;Functional mobility training;Therapeutic activities;Therapeutic exercise;Balance training;Neuromuscular re-education;Patient/family education    PT Goals (Current goals can be found in the Care Plan section)  Acute Rehab PT Goals Patient Stated Goal: unable to state PT Goal Formulation: Patient unable to participate in goal setting Time For Goal Achievement: 03/06/18 Potential to Achieve Goals: Good    Frequency Min 3X/week   Barriers to discharge        Co-evaluation               AM-PAC PT "6 Clicks" Mobility  Outcome Measure Help needed turning from your back to your side while in a flat bed without using bedrails?: A Little Help needed moving from lying on your back to sitting on the side of a flat bed without using bedrails?: A Little Help needed moving to and from  a bed to a chair (including a wheelchair)?: A Little Help needed standing up from a chair using your arms (e.g., wheelchair or bedside chair)?: A Little Help needed to walk in hospital room?: A Little Help needed climbing 3-5 steps with a railing? : A Lot 6 Click Score: 17    End of Session Equipment Utilized During Treatment: Gait belt;Cervical collar Activity Tolerance: Patient tolerated treatment well;Patient limited by pain Patient left: in chair;with call bell/phone within reach;with nursing/sitter in room Nurse Communication: Mobility status PT Visit Diagnosis: Other abnormalities of gait and mobility (R26.89);Unsteadiness on feet (R26.81);Muscle weakness (generalized) (M62.81)    Time: 4854-6270 PT Time Calculation (min) (ACUTE ONLY): 28 min   Charges:   PT Evaluation $PT Eval Moderate Complexity: 1 Mod PT Treatments $Gait Training: 8-22 mins        Kipp Laurence, PT, DPT Supplemental Physical Therapist 02/20/18 3:53 PM Pager: [phone] Office: [phone]

## 2018-02-20 NOTE — Progress Notes (Signed)
Orthopedic Tech Progress Note Patient Details:  Mario Wyatt 10/12/1990 [id number]  Ortho Devices Type of Ortho Device: Soft collar Ortho Device/Splint Interventions: Ordered, Application, Adjustment   Post Interventions Patient Tolerated: Well Instructions Provided: Care of device, Adjustment of device   [name] 02/20/2018, 6:21 PM

## 2018-02-20 NOTE — Progress Notes (Signed)
eLink Physician-Brief Progress Note Patient Name: Mario Wyatt DOB: [birth year] MRN: [id number]   Date of Service  02/20/2018  HPI/Events of Note  Nausea. discussed with bed side RN.  EKG qtc 524. Pacemaker status. Not on anti psychotic. BS normal, non tender as per bed side RN.   eICU Interventions  - Zofran 4 mg oral once for now. If not better or abdominal pain to call back. On Miralax.  Asp precautions.      Intervention Category Intermediate Interventions: Other:  Ranee Gosselin 02/20/2018, 6:03 AM

## 2018-02-20 NOTE — Consult Note (Signed)
[place] Psychiatry Consult   Reason for Consult:  Psychosis Referring Physician:  Dr. Broadus John Patient Identification: Mario Wyatt MRN:  [id number] Principal Diagnosis: Schizophrenia [facility]) Diagnosis:  Active Problems:   Acute psychosis ([place])   Swallowed foreign body   Gastric foreign body, initial encounter   Total Time spent with patient: 1 hour  Subjective:   Mario Wyatt is a 28 y.o. male patient admitted with agitation and psychosis.  HPI:   Per chart review, patient was admitted with psychosis and agitation. He required sedation and intubation in the ED due to combative behavior. He did not respond to 2 doses of Ketamine. He reportedly has had bizarre behavior for several weeks. He was putting light bulbs and other plastic objects in the oven. His bathtub was overflowing with water. He endorsed religious delusions. He was reportedly beating his head against a wall when police arrived to his apartment. He was tased by police. He sustained a C5-6 bulging disc. He was recommended for a soft cervical collar by neurosurgery today. X-ray of his abdomen showed that he ingested multiple foreign bodies (2 keys, brush, jump drive and amulet were found in his stomach and removed by upper endoscopy). BAL was negative and UDS was positive for benzodiazepines. He reportedly had a recent dental surgery and was prescribed Tramadol.   Of note, patient was admitted to [facility] in 03/2011 for psychosis in the setting of discontinuing his medications. He was hyperreligious, paranoid and delusional. Discharge medications included Remeron 15 mg qhs and Risperdal 2 mg BID.  Mario Wyatt is calm and appropriate in behavior. He inconsistently provides information and appears to selectively choose which information to not disclose by responding, "I don't even remember." He does not remember what events lead up to his hospitalization. He denies a history of mental illness and reports, "Who says I  have schizophrenia?" after he is told that it is documented in his medical record. He reports that he is not taking any medications. He denies SI, HI or AVH. He reports, "I love myself." He denies problems with sleep and appetite. He reports, "I think I work at [employer]" when asked about his occupation. He reports, "They say I have a twin brother" when asked to speak to an emergency contact. Unable to reach his brother by phone at the number listed in his chart.     Past Psychiatric History: Schizophrenia   Risk to Self:  Yes given recent self-injurious behavior and psychosis.  Risk to Others:  None. Denies HI.  Prior Inpatient Therapy:  He was hospitalized at [facility] in 03/2011 for psychosis.  Prior Outpatient Therapy:  Denies   Past Medical History:  Past Medical History:  Diagnosis Date  . Anxiety   . Arthritis    "wrists, knees; probably all over my body" (04/05/2017)  . Asthma   . Attention deficit hyperactivity disorder (ADHD)    pt denies this hx on 04/05/2017  . Cardiomyopathy ([place])   . Chronic back pain    "right neck to lower back; shoulders" (04/05/2017)  . Congenital complete AV block   . Daily headache   . GERD (gastroesophageal reflux disease)   . Macular degeneration    bilateral  . Migraine    "a couple/month" (04/05/2017)  . Pacemaker    Placed when pt was 28 years old.  . S/P cardiac pacemaker procedure, 09/08/14, [employer] 09/09/2014  . Wears glasses     Past Surgical History:  Procedure Laterality Date  .  CARDIAC PACEMAKER PLACEMENT  2007   second degree heart block  . EP IMPLANTABLE DEVICE N/A 09/08/2014   Procedure: Pacemaker Implant;  Surgeon: Deboraha Sprang, MD;  Location: [place] [facility];  Service: Cardiovascular;  Laterality: N/A;  . ESOPHAGOGASTRODUODENOSCOPY N/A 02/18/2018   Procedure: ESOPHAGOGASTRODUODENOSCOPY (EGD);  Surgeon: Ladene Artist, MD;  Location: [facility] ENDOSCOPY;  Service: Endoscopy;  Laterality: N/A;  . FOREIGN BODY REMOVAL N/A 02/18/2018    Procedure: FOREIGN BODY REMOVAL;  Surgeon: Ladene Artist, MD;  Location: [facility] ENDOSCOPY;  Service: Endoscopy;  Laterality: N/A;  . HERNIA REPAIR    . PACEMAKER INSERTION  1999  . PACEMAKER LEAD REMOVAL N/A 04/04/2017   Procedure: LEAD EXTRACTION AND NEW LEAD PLACEMENT AND CHANGE OUT OF OLD PACEMAKER;  Surgeon: Evans Lance, MD;  Location: [place];  Service: Cardiovascular;  Laterality: N/A;  . PERMANENT PACEMAKER GENERATOR CHANGE  04/04/2017   LEAD EXTRACTION AND NEW LEAD PLACEMENT AND CHANGE OUT OF OLD PACEMAKER  . UMBILICAL HERNIA REPAIR     "when I was a child"   Family History:  Family History  Problem Relation Age of Onset  . Hypertension Mother   . Diabetes Mother   . Bipolar disorder Mother   . Drug abuse Mother   . Glaucoma Father   . Anesthesia problems Neg Hx   . Hypotension Neg Hx   . Malignant hyperthermia Neg Hx   . Pseudochol deficiency Neg Hx    Family Psychiatric  History: Mother-BPAD and drug abuse.  Social History:  Social History   Substance and Sexual Activity  Alcohol Use Not Currently     Social History   Substance and Sexual Activity  Drug Use No    Social History   Socioeconomic History  . Marital status: Single    Spouse name: Not on file  . Number of children: Not on file  . Years of education: Not on file  . Highest education level: Not on file  Occupational History  . Not on file  Social Needs  . Financial resource strain: Not on file  . Food insecurity:    Worry: Not on file    Inability: Not on file  . Transportation needs:    Medical: Not on file    Non-medical: Not on file  Tobacco Use  . Smoking status: Former Smoker    Packs/day: 1.00    Years: 10.00    Pack years: 10.00    Types: Cigarettes    Last attempt to quit: 10/01/2013    Years since quitting: 4.3  . Smokeless tobacco: Never Used  Substance and Sexual Activity  . Alcohol use: Not Currently  . Drug use: No  . Sexual activity: Yes  Lifestyle  . Physical  activity:    Days per week: Not on file    Minutes per session: Not on file  . Stress: Not on file  Relationships  . Social connections:    Talks on phone: Not on file    Gets together: Not on file    Attends religious service: Not on file    Active member of club or organization: Not on file    Attends meetings of clubs or organizations: Not on file    Relationship status: Not on file  Other Topics Concern  . Not on file  Social History Narrative  . Not on file   Additional Social History: He works at [employer]. He denies illicit substance or alcohol use.  Allergies:   Allergies  Allergen Reactions  . Iodine Anaphylaxis  . Shellfish Allergy Anaphylaxis  . Latex Hives and Rash    Labs:  Results for orders placed or performed during the hospital encounter of 02/17/18 (from the past 48 hour(s))  Glucose, capillary     Status: None   Collection Time: 02/18/18 12:07 PM  Result Value Ref Range   Glucose-Capillary 72 70 - 99 mg/dL  Glucose, capillary     Status: Abnormal   Collection Time: 02/18/18 12:41 PM  Result Value Ref Range   Glucose-Capillary 67 (L) 70 - 99 mg/dL  Glucose, capillary     Status: None   Collection Time: 02/18/18  3:37 PM  Result Value Ref Range   Glucose-Capillary 75 70 - 99 mg/dL  Glucose, capillary     Status: None   Collection Time: 02/18/18  7:59 PM  Result Value Ref Range   Glucose-Capillary 89 70 - 99 mg/dL  Glucose, capillary     Status: Abnormal   Collection Time: 02/18/18 11:33 PM  Result Value Ref Range   Glucose-Capillary 67 (L) 70 - 99 mg/dL  Glucose, capillary     Status: None   Collection Time: 02/19/18  3:24 AM  Result Value Ref Range   Glucose-Capillary 75 70 - 99 mg/dL  CBC     Status: Abnormal   Collection Time: 02/19/18  3:37 AM  Result Value Ref Range   WBC 7.5 4.0 - 10.5 K/uL   RBC 4.14 (L) 4.22 - 5.81 MIL/uL   Hemoglobin 12.2 (L) 13.0 - 17.0 g/dL   HCT 37.2 (L) 39.0 - 52.0 %   MCV 89.9 80.0 - 100.0 fL   MCH  29.5 26.0 - 34.0 pg   MCHC 32.8 30.0 - 36.0 g/dL   RDW 12.5 11.5 - 15.5 %   Platelets 199 150 - 400 K/uL   nRBC 0.0 0.0 - 0.2 %    Comment: Performed at [facility], [place] [address], [place], [place] [address]  Renal function panel     Status: Abnormal   Collection Time: 02/19/18  3:37 AM  Result Value Ref Range   Sodium 139 135 - 145 mmol/L   Potassium 3.9 3.5 - 5.1 mmol/L   Chloride 109 98 - 111 mmol/L   CO2 23 22 - 32 mmol/L   Glucose, Bld 91 70 - 99 mg/dL   BUN 19 6 - 20 mg/dL   Creatinine, Ser 1.73 (H) 0.61 - 1.24 mg/dL   Calcium 8.2 (L) 8.9 - 10.3 mg/dL   Phosphorus 4.2 2.5 - 4.6 mg/dL   Albumin 2.9 (L) 3.5 - 5.0 g/dL   GFR calc non Af Amer 53 (L) >60 mL/min   GFR calc Af Amer >60 >60 mL/min   Anion gap 7 5 - 15    Comment: Performed at [facility] [address] [place], [place] [address]  Glucose, capillary     Status: None   Collection Time: 02/19/18  7:33 AM  Result Value Ref Range   Glucose-Capillary 99 70 - 99 mg/dL  Glucose, capillary     Status: Abnormal   Collection Time: 02/19/18  3:13 PM  Result Value Ref Range   Glucose-Capillary 127 (H) 70 - 99 mg/dL  Glucose, capillary     Status: Abnormal   Collection Time: 02/19/18  7:46 PM  Result Value Ref Range   Glucose-Capillary 166 (H) 70 - 99 mg/dL   Comment 1 Notify RN    Comment  2 Document in Chart     Current Facility-Administered Medications  Medication Dose Route Frequency Provider Last Rate Last Dose  . albuterol (PROVENTIL) (2.5 MG/3ML) 0.083% nebulizer solution 2.5 mg  2.5 mg Nebulization Q2H PRN Corey Harold, NP      . carvedilol (COREG) tablet 3.125 mg  3.125 mg Oral BID WC Domenic Polite, MD      . chlorhexidine gluconate (MEDLINE KIT) (PERIDEX) 0.12 % solution 15 mL  15 mL Mouth Rinse BID Simonne Maffucci B, MD   15 mL at 02/19/18 2051  . enoxaparin (LOVENOX) injection 40 mg  40 mg Subcutaneous Q24H Simonne Maffucci B, MD   40 mg at 02/20/18 1033  . polyethylene glycol (MIRALAX /  GLYCOLAX) packet 17 g  17 g Oral Daily PRN Arnell Asal, NP        Musculoskeletal: Strength & Muscle Tone: within normal limits Gait & Station: UTA since sitting in a chair. Patient leans: N/A  Psychiatric Specialty Exam: Physical Exam  Nursing note and vitals reviewed. Constitutional: He is oriented to person, place, and time. He appears well-developed and well-nourished.  HENT:  Head: Normocephalic and atraumatic.  Neck: Normal range of motion.  Respiratory: Effort normal.  Musculoskeletal: Normal range of motion.  Neurological: He is alert and oriented to person, place, and time.  Psychiatric: His speech is normal and behavior is normal. His affect is inappropriate. Thought content is paranoid. Cognition and memory are normal. He expresses impulsivity.    Review of Systems  Cardiovascular: Positive for chest pain.  Gastrointestinal: Positive for abdominal pain.  Musculoskeletal: Positive for myalgias.  Psychiatric/Behavioral: Negative for depression, hallucinations, substance abuse and suicidal ideas. The patient does not have insomnia.   All other systems reviewed and are negative.   Blood pressure 112/72, pulse 72, temperature 99.3 F (37.4 C), temperature source Oral, resp. rate 16, height 5' 4" (1.626 m), weight 52.3 kg, SpO2 98 %.Body mass index is 19.79 kg/m.  General Appearance: Fairly Groomed, young, African American male, wearing a hospital gown with a c-collar and corrective lenses who is sitting in a chair. NAD.   Eye Contact:  Good  Speech:  Clear and Coherent and Normal Rate  Volume:  Normal  Mood:  Euthymic  Affect:  Irritable   Thought Process:  Linear and Descriptions of Associations: Intact  Orientation:  Full (Time, Place, and Person)  Thought Content:  Paranoid Ideation  Suicidal Thoughts:  No  Homicidal Thoughts:  No  Memory:  Immediate;   Fair Recent;   Fair Remote;   Fair  Judgement:  Impaired  Insight:  Shallow  Psychomotor Activity:   Normal  Concentration:  Concentration: Good and Attention Span: Good  Recall:  [employer] of Knowledge:  Fair  Language:  Fair  Akathisia:  No  Handed:  Right  AIMS (if indicated):   N/A  Assets:  Financial Resources/Insurance Housing Resilience Social Support  ADL's:  Impaired  Cognition:  WNL although inconsistently reports remote information.   Sleep:   Mario Wyatt is a 28 y.o. male who was admitted with agitation and psychosis requiring sedation and intubation. He sustained a C5-6 bulging disc. He appears to selectively disclose information today. He is oriented to person, place and time but is unable to provide information about where he lives or his family. He appears paranoid and often refers to "they" when answering questions. Attempted to contact his brother by phone but he did not answer so an HIPAA  compliant voicemail was left. He warrants inpatient psychiatric hospitalization for psychosis causing serious harm to self.   Treatment Plan Summary: -Patient warrants inpatient psychiatric hospitalization given high risk of harm to self. -Continue bedside sitter.  -Start Risperdal 1 mg BID for psychosis.  -EKG reviewed and QTc 459 on 2/17. Please closely monitor when starting or increasing QTc prolonging agents.  -Patient is IVC'd so he may not leave the hospital.  -Will sign off on patient at this time. Please consult psychiatry again as needed.     Disposition: Recommend psychiatric Inpatient admission when medically cleared.    Faythe Dingwall, DO 02/20/2018 11:02 AM

## 2018-02-20 NOTE — Evaluation (Signed)
Occupational Therapy Evaluation Patient Details Name: Mario Wyatt MRN: [id number] DOB: Jun 25, 1990 Today's Date: 02/20/2018    History of Present Illness Pt is a 28 yo s/p psychosis requiring tasing in ER due to combativeness. Before coming in, Pt ate USB and key requiring surgery to remove items 02/19/18. Pt with facial fractures and negative CT head. C5-6 showed bulging disc and neurosurgery will f/up OP. Pt reported numbness in L hand on ulnar side. No L hand fx on scans.   Clinical Impression   Pt PTA: pt unable to state. Currently, pt stating "I don't remember" when asked about PTA. Pt limited by dizziness, but pt not orthostatic in sitting or standing. Pt performing grooming seated with set-upA. Pt modA overall for ADL due to unaware of situation. Pt performing ADL functional mobility with RW ambulating 3' then felt too dizzy to stand upright and in sitting dizziness was not relieved. (BP in general comments). Pt would benefit from continued OT skilled services for ADL and mobility in behavioral health setting to increase independence. OT to follow acutely.    Follow Up Recommendations  SNF;Supervision/Assistance - 24 hour;Other (comment)(behavioral health with OT)    Equipment Recommendations  Other (comment)(could benefit from RW if gait does not improve)    Recommendations for Other Services PT consult;Other (comment)(ordered; psychiatry - ordered)     Precautions / Restrictions Precautions Precautions: Fall;Other (comment)(IVC) Required Braces or Orthoses: Cervical Brace(said soft collar will do, not in room during visit) Cervical Brace: Hard collar;Soft collar Restrictions Weight Bearing Restrictions: No      Mobility Bed Mobility Overal bed mobility: Needs Assistance Bed Mobility: Rolling;Sidelying to Sit Rolling: Min guard(log roll with side rail) Sidelying to sit: Min guard;HOB elevated(log roll with side rail use)       General bed mobility comments:  Assist to guide BLEs  Transfers Overall transfer level: Needs assistance Equipment used: Rolling walker (2 wheeled) Transfers: Sit to/from [employer] Sit to Stand: Min assist Stand pivot transfers: Min assist       General transfer comment: requires constant cueing for proper technique with RW. Moans- not sure if in pain or if confused.    Balance                                           ADL either performed or assessed with clinical judgement   ADL Overall ADL's : Needs assistance/impaired Eating/Feeding: Set up   Grooming: Supervision/safety;Set up;Sitting   Upper Body Bathing: Minimal assistance;Sitting   Lower Body Bathing: Moderate assistance;Sitting/lateral leans;Sit to/from stand   Upper Body Dressing : Minimal assistance;Sitting;Standing   Lower Body Dressing: Moderate assistance;Sitting/lateral leans;Sit to/from stand   Toilet Transfer: BSC;RW;Ambulation   Toileting- Architect and Hygiene: Moderate assistance;Sitting/lateral lean;Sit to/from stand       Functional mobility during ADLs: Minimal assistance;Rolling walker(dizziness reported. Vital signs in general comments) General ADL Comments: Pt requiring increased time and confusion is prevelant with situation. Pt able to follow commands with slow response time.     Vision Baseline Vision/History: No visual deficits Vision Assessment?: No apparent visual deficits     Perception     Praxis      Pertinent Vitals/Pain Pain Assessment: No/denies pain Pain Intervention(s): Monitored during session     Hand Dominance     Extremity/Trunk Assessment Upper Extremity Assessment Upper Extremity Assessment: Generalized weakness;LUE deficits/detail LUE Deficits / Details:  numbness in L hand at ulnar aspect LUE Sensation: decreased light touch   Lower Extremity Assessment Lower Extremity Assessment: Defer to PT evaluation   Cervical / Trunk  Assessment Cervical / Trunk Assessment: Normal   Communication Communication Communication: Expressive difficulties("understand)   Cognition Arousal/Alertness: Awake/alert Behavior During Therapy: WFL for tasks assessed/performed;Flat affect Overall Cognitive Status: Impaired/Different from baseline Area of Impairment: Orientation;Memory;Awareness                 Orientation Level: Disoriented to;Place;Time;Situation   Memory: Decreased short-term memory         General Comments: stating that he does not remember anything   General Comments  Dizziness reported in standing  and sitting that does not resolve- pt sitting BP: 115/81; BP in standing: 107/79- not orthostatic.    Exercises Exercises: Hand exercises Hand Exercises Digit Composite Flexion: AROM;5 reps;Both   Shoulder Instructions      Home Living Family/patient expects to be discharged to:: Other (Comment)(IVC to behavioral health) Living Arrangements: Alone                               Additional Comments: unable to assess as pt states "does not remember."      Prior Functioning/Environment Level of Independence: Independent                 OT Problem List: Decreased strength;Decreased activity tolerance;Impaired balance (sitting and/or standing);Decreased coordination;Decreased safety awareness;Pain;Impaired UE functional use      OT Treatment/Interventions: Self-care/ADL training;Therapeutic exercise;Neuromuscular education;Energy conservation;Therapeutic activities;Patient/family education;Balance training    OT Goals(Current goals can be found in the care plan section) Acute Rehab OT Goals Patient Stated Goal: unable to state OT Goal Formulation: With patient Time For Goal Achievement: 03/06/18 Potential to Achieve Goals: Good  OT Frequency: Min 2X/week   Barriers to D/C: Decreased caregiver support          Co-evaluation              AM-PAC OT "6 Clicks" Daily  Activity     Outcome Measure Help from another person eating meals?: None Help from another person taking care of personal grooming?: A Little Help from another person toileting, which includes using toliet, bedpan, or urinal?: A Little Help from another person bathing (including washing, rinsing, drying)?: A Little Help from another person to put on and taking off regular upper body clothing?: A Little Help from another person to put on and taking off regular lower body clothing?: A Little 6 Click Score: 19   End of Session Equipment Utilized During Treatment: Rolling walker;Gait belt;Cervical collar Nurse Communication: Mobility status  Activity Tolerance: Patient limited by pain Patient left: in chair;with call bell/phone within reach;with nursing/sitter in room  OT Visit Diagnosis: Unsteadiness on feet (R26.81);Other abnormalities of gait and mobility (R26.89);Muscle weakness (generalized) (M62.81)                Time: 3354-5625 OT Time Calculation (min): 38 min Charges:  OT General Charges $OT Visit: 1 Visit OT Evaluation $OT Eval Moderate Complexity: 1 Mod OT Treatments $Self Care/Home Management : 8-22 mins $Neuromuscular Re-education: 8-22 mins  Cristi Loron) Glendell Docker OTR/L Acute Rehabilitation Services Pager: [phone] Office: [phone]   Sandrea Hughs 02/20/2018, 10:17 AM

## 2018-02-20 NOTE — Plan of Care (Signed)
  Problem: Clinical Measurements: Goal: Ability to maintain clinical measurements within normal limits will improve Outcome: Progressing Goal: Will remain free from infection Outcome: Progressing   Problem: Nutrition: Goal: Adequate nutrition will be maintained Outcome: Progressing   Problem: Coping: Goal: Level of anxiety will decrease Outcome: Progressing   Problem: Skin Integrity: Goal: Risk for impaired skin integrity will decrease Outcome: Progressing

## 2018-02-20 NOTE — Progress Notes (Addendum)
PROGRESS NOTE    Mario Wyatt  WLS:[id number] DOB: 28-Dec-1990 DOA: 02/17/2018 PCP: Bartholome Bill, MD  Brief Narrative:  28 year old male with history of asthma, congenital complete AV block and cardiomyopathy status post pacemaker, prior history of ADHD, anxiety, hospitalized to [facility] in 2013 with paranoia and delusional disorder was brought to the emergency room on 2/17 by [employer] after he barricaded himself in his apartment, he was found banging his head against the door, all the drains in the house were stopped up and overflowing, patient had put light bulbs in the oven, subsequently brought to the ED by police officers patient was increasingly combative agitated prompting officers to tase him, he was sedated in the emergency room subsequently intubated. -On further evaluation he was noted to have facial bone fractures, C-spine herniation and foreign body ingestion, soft tissue injuries to both arms, UDS was positive for benzodiazepines. -Underwent EGD with removal of foreign objects 2/18 -Extubated 2/19 -Also seen by neurosurgery for cervical disc herniation on CT recommended soft cervical collar and outpatient follow-up -Transferred from PCCM to [facility] service today 2/20, Psych consulted  Assessment & Plan:   Psychosis/agitation -Prior history of [facility] hospitalization in 2013 with severe paranoia and delusional disorder -Sedated and intubated on admission due to severe agitation and combativeness -Extubated, off sedation, more calm now -currently reports that he cannot remember anything about himself except his name, I doubt this -will ask Psych to eval -He was involuntarily committed in ICU due to above behavior, social work following  Foreign body ingestion -In the setting of above -Underwent EGD 2/18 with removal of 2 keys, brush, jump drive from his stomach -Add PPI  Acute hypoxic respiratory failure -resolved, extubated yesterday, wean off O2  C5-C6 central disc  bulge/herniation -Appreciate neurosurgery consult by Dr. Trenton Gammon, soft collar recommended an outpatient follow-up  History of congenital AV block, cardiomyopathy -Resume Coreg  Prolonged QTC -Repeat EKG, avoid QT prolonging meds  DVT prophylaxis: Lovenox Code Status: Full code Family Communication: No family at bedside Disposition Plan: Will likely need inpatient psych  Consultants:   PCCM  Neurosurgery  GI   Procedures: EGD with foreign body retrieval 2/18  Antimicrobials:    Subjective: -Reports that he cannot remember anything about himself  Objective: Vitals:   02/19/18 2052 02/19/18 2133 02/20/18 0500 02/20/18 0539  BP:  107/69  112/72  Pulse:  78  72  Resp:  17  16  Temp:  99.3 F (37.4 C)  99.3 F (37.4 C)  TempSrc:  Oral  Oral  SpO2: 98% 99%  98%  Weight:  52.3 kg 52.3 kg   Height:  5' 4"  (1.626 m)      Intake/Output Summary (Last 24 hours) at 02/20/2018 1144 Last data filed at 02/20/2018 0930 Gross per 24 hour  Intake 995 ml  Output 532.5 ml  Net 462.5 ml   Filed Weights   02/19/18 0418 02/19/18 2133 02/20/18 0500  Weight: 52.3 kg 52.3 kg 52.3 kg    Examination:  General exam: Thinly built young male, sitting in the recliner, c-collar on, no distress HEENT: Left facial and forehead soft tissue swelling Respiratory system: Clear to auscultation. Respiratory effort normal. Cardiovascular system: S1 & S2 heard, RRR Gastrointestinal system: Abdomen is nondistended, soft and nontender.Normal bowel sounds heard. Central nervous system: Alert and oriented. No focal neurological deficits. Extremities: no edema Skin: No rashes, lesions or ulcers Psychiatry: Flat affect    Data Reviewed:   CBC: Recent Labs  Lab 02/17/18 2218 02/18/18  4098 02/18/18 0358 02/18/18 0426 02/18/18 1052 02/19/18 0337  WBC 9.3  --   --  9.1  --  7.5  NEUTROABS 7.4  --   --   --   --   --   HGB 14.5 13.6 12.2* 13.3 13.3 12.2*  HCT 43.2 40.0 36.0* 39.7 39.0 37.2*   MCV 88.9  --   --  89.6  --  89.9  PLT 259  --   --  234  --  119   Basic Metabolic Panel: Recent Labs  Lab 02/17/18 2218  02/18/18 0136 02/18/18 0358 02/18/18 0426 02/18/18 1052 02/19/18 0337  NA 136   < > 138 138 138 139 139  K 3.5   < > 3.9 4.0 4.3 3.8 3.9  CL 101  --  106  --  106  --  109  CO2 23  --  24  --  21*  --  23  GLUCOSE 80  --  109*  --  90  --  91  BUN 14  --  14  --  15  --  19  CREATININE 1.28*  --  1.22  --  1.35*  --  1.73*  CALCIUM 9.6  --  8.4*  --  8.7*  --  8.2*  MG  --   --   --   --  2.7*  --   --   PHOS  --   --   --   --  5.0*  --  4.2   < > = values in this interval not displayed.   GFR: Estimated Creatinine Clearance: 47 mL/min (A) (by C-G formula based on SCr of 1.73 mg/dL (H)). Liver Function Tests: Recent Labs  Lab 02/17/18 2218 02/18/18 0136 02/19/18 0337  AST 62* 62*  --   ALT 26 26  --   ALKPHOS 56 51  --   BILITOT 1.2 1.2  --   PROT 7.3 6.5  --   ALBUMIN 4.2 3.5 2.9*   No results for input(s): LIPASE, AMYLASE in the last 168 hours. No results for input(s): AMMONIA in the last 168 hours. Coagulation Profile: No results for input(s): INR, PROTIME in the last 168 hours. Cardiac Enzymes: No results for input(s): CKTOTAL, CKMB, CKMBINDEX, TROPONINI in the last 168 hours. BNP (last 3 results) No results for input(s): PROBNP in the last 8760 hours. HbA1C: No results for input(s): HGBA1C in the last 72 hours. CBG: Recent Labs  Lab 02/18/18 2333 02/19/18 0324 02/19/18 0733 02/19/18 1513 02/19/18 1946  GLUCAP 67* 75 99 127* 166*   Lipid Profile: Recent Labs    02/18/18 0426  TRIG 100   Thyroid Function Tests: No results for input(s): TSH, T4TOTAL, FREET4, T3FREE, THYROIDAB in the last 72 hours. Anemia Panel: No results for input(s): VITAMINB12, FOLATE, FERRITIN, TIBC, IRON, RETICCTPCT in the last 72 hours. Urine analysis:    Component Value Date/Time   COLORURINE YELLOW 08/12/2015 [place]  08/12/2015 1507   LABSPEC 1.030 08/12/2015 1507   PHURINE 5.5 08/12/2015 1507   GLUCOSEU NEGATIVE 08/12/2015 1507   HGBUR NEGATIVE 08/12/2015 1507   BILIRUBINUR NEGATIVE 08/12/2015 1507   KETONESUR 15 (A) 08/12/2015 1507   PROTEINUR NEGATIVE 08/12/2015 1507   UROBILINOGEN 1.0 03/15/2011 2044   NITRITE NEGATIVE 08/12/2015 1507   LEUKOCYTESUR NEGATIVE 08/12/2015 1507   Sepsis Labs: @LABRCNTIP (procalcitonin:4,lacticidven:4)  ) Recent Results (from the past 240 hour(s))  Culture, blood (routine x 2)     Status:  None (Preliminary result)   Collection Time: 02/18/18  4:26 AM  Result Value Ref Range Status   Specimen Description BLOOD RIGHT HAND  Final   Special Requests   Final    BOTTLES DRAWN AEROBIC AND ANAEROBIC Blood Culture adequate volume   Culture   Final    NO GROWTH 1 DAY Performed at [facility], [address] [place], [place] [address]    Report Status PENDING  Incomplete  Culture, blood (routine x 2)     Status: None (Preliminary result)   Collection Time: 02/18/18  4:26 AM  Result Value Ref Range Status   Specimen Description BLOOD LEFT HAND  Final   Special Requests   Final    BOTTLES DRAWN AEROBIC ONLY Blood Culture results may not be optimal due to an excessive volume of blood received in culture bottles   Culture   Final    NO GROWTH 1 DAY Performed at [facility], [place] [address], [place], [place] [address]    Report Status PENDING  Incomplete  MRSA PCR Screening     Status: None   Collection Time: 02/18/18  8:13 AM  Result Value Ref Range Status   MRSA by PCR NEGATIVE NEGATIVE Final    Comment:        The GeneXpert MRSA Assay (FDA approved for NASAL specimens only), is one component of a comprehensive MRSA colonization surveillance program. It is not intended to diagnose MRSA infection nor to guide or monitor treatment for MRSA infections. Performed at [facility], [place] [address] [place], [place] [address]           Radiology Studies: Dg Abd 1 View  Result Date: 02/18/2018 CLINICAL DATA:  Post EGD. Removal of gastric foreign bodies. Confirm removal of foreign bodies. EXAM: ABDOMEN - 1 VIEW COMPARISON:  02/17/2018 radiographs FINDINGS: The metallic foreign bodies projecting over the stomach are no longer present. Moderate stool retention is seen within the left colon. No free air is identified. Heart size is normal with right basilar consolidation, slightly improved in appearance. IMPRESSION: No retained unaccountable metallic radiopaque foreign body. No free air. Electronically Signed   By: Ashley Royalty M.D.   On: 02/18/2018 19:25        Scheduled Meds: . carvedilol  3.125 mg Oral BID WC  . chlorhexidine gluconate (MEDLINE KIT)  15 mL Mouth Rinse BID  . enoxaparin (LOVENOX) injection  40 mg Subcutaneous Q24H   Continuous Infusions:   LOS: 2 days    Time spent: 3mn    PDomenic Polite MD [facility] 02/20/2018, 11:44 AM

## 2018-02-20 NOTE — Social Work (Signed)
Pt will remain under IVC, will need to progress with mobility prior to being able to dc to [facility]. Psychiatry continues to recommended inpatient psych.  CSW continuing to follow for support with disposition when medically appropriate.  Octavio Graves, MSW, [facility] Clinical Social Work [phone]

## 2018-02-20 NOTE — Progress Notes (Signed)
  Speech Language Pathology Treatment: Dysphagia  Patient Details Name: Mario Wyatt MRN: [id number] DOB: Aug 25, 1990 Today's Date: 02/20/2018 Time: 3437-3578 SLP Time Calculation (min) (ACUTE ONLY): 12 min  Assessment / Plan / Recommendation Clinical Impression  Pt is more alert with somewhat increased awareness; more appropriate than yesterday and is asking what happened to him. He needs repetition to orient him to situation. Multiple trials self fed cup sips thin (prefers not to use straw) and solid (graham cracker) consumed without overt s/s aspiration (improved from yesterday and time off ventilator). Educated pt on safe swallow strategies to decrease aspiration risk given intubation and cognition that is not yet to baseline. Recommend regular texture, thin liquids, pills with liquid if small, if large, whole in applesauce with full supervision initially due to risk of impulsibity. He does not require follow up for swallow at this time.      HPI HPI: 28 year old male admitted with extreme agitation and psychosis. Past medical history, which is significant for asthma, congenital complete AV block, cardiomyopathy, and pacemaker.  Per chart supposedly he had dental work a few days prior to admission and had been taking pain medications as result. Pt requiring tasing due to severe combativeness. Pt intubated 2/17-2/19. Imaging revealed pt had ingested foreign body of a key and thumb drive. He underwent EGD for removal of objects. CXR Clear lungs.  No other active cardiopulmonary disease      SLP Plan  All goals met;Discharge SLP treatment due to (comment)       Recommendations  Diet recommendations: Regular;Thin liquid Liquids provided via: Cup;Straw Medication Administration: Whole meds with puree Supervision: Patient able to self feed;Full supervision/cueing for compensatory strategies Compensations: Slow rate;Small sips/bites Postural Changes and/or Swallow Maneuvers: Seated  upright 90 degrees                Oral Care Recommendations: Oral care BID Follow up Recommendations: None SLP Visit Diagnosis: Dysphagia, unspecified (R13.10) Plan: All goals met;Discharge SLP treatment due to (comment)       GO                Houston Siren 02/20/2018, 9:49 AM  Orbie Pyo Colvin Caroli.Ed Risk analyst [phone] Office [phone]

## 2018-02-20 NOTE — Social Work (Signed)
Pt was IVC'd on 2/17 per hand off from previous unit CSW. When pt medically appropriate for assessment please order a psychiatric consult.  CSW continuing to follow for support with disposition when medically appropriate.  Octavio Graves, MSW, [facility] Clinical Social Work [phone]

## 2018-02-21 LAB — CBC
HCT: 33.9 % — ABNORMAL LOW (ref 39.0–52.0)
Hemoglobin: 11.4 g/dL — ABNORMAL LOW (ref 13.0–17.0)
MCH: 30.1 pg (ref 26.0–34.0)
MCHC: 33.6 g/dL (ref 30.0–36.0)
MCV: 89.4 fL (ref 80.0–100.0)
Platelets: 232 10*3/uL (ref 150–400)
RBC: 3.79 MIL/uL — ABNORMAL LOW (ref 4.22–5.81)
RDW: 12.4 % (ref 11.5–15.5)
WBC: 4.4 10*3/uL (ref 4.0–10.5)
nRBC: 0 % (ref 0.0–0.2)

## 2018-02-21 LAB — BASIC METABOLIC PANEL
Anion gap: 6 (ref 5–15)
BUN: 9 mg/dL (ref 6–20)
CO2: 27 mmol/L (ref 22–32)
Calcium: 8.2 mg/dL — ABNORMAL LOW (ref 8.9–10.3)
Chloride: 106 mmol/L (ref 98–111)
Creatinine, Ser: 0.97 mg/dL (ref 0.61–1.24)
GFR calc Af Amer: >60 mL/min (ref >60)
GFR calc non Af Amer: >60 mL/min (ref >60)
Glucose, Bld: 105 mg/dL — ABNORMAL HIGH (ref 70–99)
Potassium: 4 mmol/L (ref 3.5–5.1)
Sodium: 139 mmol/L (ref 135–145)

## 2018-02-21 NOTE — Progress Notes (Signed)
PROGRESS NOTE    Mario Wyatt  GQQ:[id number] DOB: 08-Aug-1990 DOA: 02/17/2018 PCP: Bartholome Bill, MD  Brief Narrative:  28 year old male with history of asthma, congenital complete AV block and cardiomyopathy status post pacemaker, prior history of ADHD, anxiety, hospitalized to [facility] in 2013 with paranoia and delusional disorder was brought to the emergency room on 2/17 by [employer] after he barricaded himself in his apartment, he was found banging his head against the door, all the drains in the house were stopped up and overflowing, patient had put light bulbs in the oven, subsequently brought to the ED by police officers patient was increasingly combative agitated prompting officers to tase him, he was sedated in the emergency room subsequently intubated. -On further evaluation he was noted to have facial bone fractures, C-spine herniation and foreign body ingestion, soft tissue injuries to both arms, UDS was positive for benzodiazepines. -Underwent EGD with removal of foreign objects 2/18 -Extubated 2/19 -Also seen by neurosurgery for cervical disc herniation on CT recommended soft cervical collar and outpatient follow-up -Transferred from PCCM to [facility] service 2/20, Psych consulted, [facility] recommended  Assessment & Plan:   Psychosis/agitation -Prior history of [facility] hospitalization in 2013 with severe paranoia and delusional disorder -Sedated and intubated on admission due to severe agitation and combativeness -Extubated, off sedation, more calm now -Psych consult appreciated, started on Risperdal patient taking this only intermittently -Involuntarily committed while in ICU, social work following -Medically stable for discharge to inpatient psych, mobility needs to improve a bit more per social work -Increase mobility, encourage ambulation, PT following  Foreign body ingestion -In the setting of above -Underwent EGD 2/18 with removal of 2 keys, brush, jump drive from his  stomach -Continue  Acute hypoxic respiratory failure -resolved, extubated 2/19, wean off O2  C5-C6 central disc bulge/herniation -Appreciate neurosurgery consult by Dr. Trenton Gammon, soft collar recommended an outpatient follow-up  History of congenital AV block, cardiomyopathy -Resume Coreg  Prolonged QTC -Repeat EKG, avoid QT prolonging meds  DVT prophylaxis: Lovenox Code Status: Full code Family Communication: No family at bedside Disposition Plan: Inpatient psych when mobility improves  Consultants:   PCCM  Neurosurgery  GI   Procedures: EGD with foreign body retrieval 2/18  Antimicrobials:    Subjective: -reports that he does not remember things about himself -History is very inconsistent -Intermittently refusing medications including Risperdal started by psych yesterday  Objective: Vitals:   02/20/18 1338 02/20/18 2254 02/21/18 0453 02/21/18 0500  BP: 108/61 120/76 124/86   Pulse: 70 82 76   Resp: 18  (!) 26 20  Temp: 98.7 F (37.1 C) 98.9 F (37.2 C) 98.5 F (36.9 C)   TempSrc: Oral Oral Oral   SpO2: 98% 98% 100%   Weight:    52.3 kg  Height:        Intake/Output Summary (Last 24 hours) at 02/21/2018 1201 Last data filed at 02/21/2018 6712 Gross per 24 hour  Intake 1320 ml  Output 1800 ml  Net -480 ml   Filed Weights   02/19/18 2133 02/20/18 0500 02/21/18 0500  Weight: 52.3 kg 52.3 kg 52.3 kg    Examination:  Gen: Awake, Alert, Oriented X 3, sitting in recliner, c-collar on, no distress HEENT: Left facial and forehead soft tissue swelling Lungs: Clear bilaterally CVS: RRR,No Gallops,Rubs or new Murmurs Abd: soft, Non tender, non distended, BS present Extremities: No Cyanosis, Clubbing or edema Skin: no new rashes Psychiatry: Flat affect    Data Reviewed:   CBC: Recent Labs  Lab 02/17/18 2218  02/18/18 0358 02/18/18 0426 02/18/18 1052 02/19/18 0337 02/21/18 0640  WBC 9.3  --   --  9.1  --  7.5 4.4  NEUTROABS 7.4  --   --   --    --   --   --   HGB 14.5   < > 12.2* 13.3 13.3 12.2* 11.4*  HCT 43.2   < > 36.0* 39.7 39.0 37.2* 33.9*  MCV 88.9  --   --  89.6  --  89.9 89.4  PLT 259  --   --  234  --  199 232   < > = values in this interval not displayed.   Basic Metabolic Panel: Recent Labs  Lab 02/17/18 2218  02/18/18 0136 02/18/18 0358 02/18/18 0426 02/18/18 1052 02/19/18 0337 02/21/18 0640  NA 136   < > 138 138 138 139 139 139  K 3.5   < > 3.9 4.0 4.3 3.8 3.9 4.0  CL 101  --  106  --  106  --  109 106  CO2 23  --  24  --  21*  --  23 27  GLUCOSE 80  --  109*  --  90  --  91 105*  BUN 14  --  14  --  15  --  19 9  CREATININE 1.28*  --  1.22  --  1.35*  --  1.73* 0.97  CALCIUM 9.6  --  8.4*  --  8.7*  --  8.2* 8.2*  MG  --   --   --   --  2.7*  --   --   --   PHOS  --   --   --   --  5.0*  --  4.2  --    < > = values in this interval not displayed.   GFR: Estimated Creatinine Clearance: 83.9 mL/min (by C-G formula based on SCr of 0.97 mg/dL). Liver Function Tests: Recent Labs  Lab 02/17/18 2218 02/18/18 0136 02/19/18 0337  AST 62* 62*  --   ALT 26 26  --   ALKPHOS 56 51  --   BILITOT 1.2 1.2  --   PROT 7.3 6.5  --   ALBUMIN 4.2 3.5 2.9*   No results for input(s): LIPASE, AMYLASE in the last 168 hours. No results for input(s): AMMONIA in the last 168 hours. Coagulation Profile: No results for input(s): INR, PROTIME in the last 168 hours. Cardiac Enzymes: No results for input(s): CKTOTAL, CKMB, CKMBINDEX, TROPONINI in the last 168 hours. BNP (last 3 results) No results for input(s): PROBNP in the last 8760 hours. HbA1C: No results for input(s): HGBA1C in the last 72 hours. CBG: Recent Labs  Lab 02/18/18 2333 02/19/18 0324 02/19/18 0733 02/19/18 1513 02/19/18 1946  GLUCAP 67* 75 99 127* 166*   Lipid Profile: No results for input(s): CHOL, HDL, LDLCALC, TRIG, CHOLHDL, LDLDIRECT in the last 72 hours. Thyroid Function Tests: No results for input(s): TSH, T4TOTAL, FREET4, T3FREE,  THYROIDAB in the last 72 hours. Anemia Panel: No results for input(s): VITAMINB12, FOLATE, FERRITIN, TIBC, IRON, RETICCTPCT in the last 72 hours. Urine analysis:    Component Value Date/Time   COLORURINE YELLOW 08/12/2015 1507   APPEARANCEUR CLEAR 08/12/2015 1507   LABSPEC 1.030 08/12/2015 1507   PHURINE 5.5 08/12/2015 1507   GLUCOSEU NEGATIVE 08/12/2015 1507   HGBUR NEGATIVE 08/12/2015 1507   BILIRUBINUR NEGATIVE 08/12/2015 1507   KETONESUR 15 (A) 08/12/2015 1507  PROTEINUR NEGATIVE 08/12/2015 1507   UROBILINOGEN 1.0 03/15/2011 2044   NITRITE NEGATIVE 08/12/2015 1507   LEUKOCYTESUR NEGATIVE 08/12/2015 1507   Sepsis Labs: _0 (procalcitonin:4,lacticidven:4)  ) Recent Results (from the past 240 hour(s))  Culture, blood (routine x 2)     Status: None (Preliminary result)   Collection Time: 02/18/18  4:26 AM  Result Value Ref Range Status   Specimen Description BLOOD RIGHT HAND  Final   Special Requests   Final    BOTTLES DRAWN AEROBIC AND ANAEROBIC Blood Culture adequate volume   Culture NO GROWTH 3 DAYS  Final   Report Status PENDING  Incomplete  Culture, blood (routine x 2)     Status: None (Preliminary result)   Collection Time: 02/18/18  4:26 AM  Result Value Ref Range Status   Specimen Description BLOOD LEFT HAND  Final   Special Requests   Final    BOTTLES DRAWN AEROBIC ONLY Blood Culture results may not be optimal due to an excessive volume of blood received in culture bottles   Culture NO GROWTH 3 DAYS  Final   Report Status PENDING  Incomplete  MRSA PCR Screening     Status: None   Collection Time: 02/18/18  8:13 AM  Result Value Ref Range Status   MRSA by PCR NEGATIVE NEGATIVE Final    Comment:        The GeneXpert MRSA Assay (FDA approved for NASAL specimens only), is one component of a comprehensive MRSA colonization surveillance program. It is not intended to diagnose MRSA infection nor to guide or monitor treatment for MRSA  infections. Performed at [facility], [place] [address], [place], [place] [address]          Radiology Studies: No results found.      Scheduled Meds: . carvedilol  3.125 mg Oral BID WC  . chlorhexidine gluconate (MEDLINE KIT)  15 mL Mouth Rinse BID  . enoxaparin (LOVENOX) injection  40 mg Subcutaneous Q24H  . pantoprazole  40 mg Oral Q1200  . risperiDONE  1 mg Oral BID   Continuous Infusions:   LOS: 3 days    Time spent: 67mn    PDomenic Polite MD [facility] 02/21/2018, 12:01 PM

## 2018-02-21 NOTE — Social Work (Signed)
Pt not medically appropriate for [facility] referral at this time.  Requires SNF level of assist. Pt will need to be able to ambulate independently prior to d/c to [facility].  CSW continuing to follow for support with disposition when medically appropriate.  Octavio Graves, MSW, [facility] Clinical Social Work [phone]

## 2018-02-21 NOTE — Progress Notes (Signed)
Physical Therapy Treatment Patient Details Name: Mario Wyatt MRN: [id number] DOB: 06-18-1990 Today's Date: 02/21/2018    History of Present Illness Pt is a 28 yo s/p psychosis requiring tasing in ER due to combativeness. Before coming in, Pt ate USB and key requiring surgery to remove items 02/19/18. Pt with facial fractures and negative CT head. C5-6 showed bulging disc and neurosurgery will f/up OP. Pt reported numbness in L hand on ulnar side. No L hand fx on scans.    PT Comments    Patient seen for mobility progression. Pt OOB in recliner upon arrival and agreeable to therapy. Pt is progressing well toward PT goals and overall mod I/supervision for OOB mobility. Pt tolerated ambulating 500 ft without LOB and no AD. Sitter present throughout. Pt would benefit from mobilizing often with nursing staff and will continue to benefit from further skilled PT services in acute setting.      Follow Up Recommendations  Supervision/Assistance - 24 hour;No PT follow up     Equipment Recommendations  None recommended by PT    Recommendations for Other Services       Precautions / Restrictions Precautions Precautions: Fall;Other (comment)(IVC) Cervical Brace: Soft collar Restrictions Weight Bearing Restrictions: No    Mobility  Bed Mobility               General bed mobility comments: patient received in recliner  Transfers Overall transfer level: Modified independent Equipment used: None Transfers: Sit to/from Stand              Ambulation/Gait Ambulation/Gait assistance: Min Emergency planning/management officer (Feet): 500 Feet Assistive device: None(pt used rail in hallway at times) Gait Pattern/deviations: Step-through pattern;Decreased stride length;Drifts right/left;Narrow base of support Gait velocity: decreased   General Gait Details: unsteady but no LOB with horizontal head turns or directional changes   Stairs             Wheelchair Mobility     Modified Rankin (Stroke Patients Only)       Balance Overall balance assessment: Mild deficits observed, not formally tested                                          Cognition Arousal/Alertness: Awake/alert Behavior During Therapy: WFL for tasks assessed/performed;Flat affect Overall Cognitive Status: No family/caregiver present to determine baseline cognitive functioning                                 General Comments: pt asking "what happened?" refering to having back pain      Exercises      General Comments        Pertinent Vitals/Pain Pain Assessment: Faces Faces Pain Scale: Hurts little more Pain Location: head and back Pain Descriptors / Indicators: Grimacing;Headache;Guarding;Sore Pain Intervention(s): Limited activity within patient's tolerance;Monitored during session;Repositioned    Home Living                      Prior Function            PT Goals (current goals can now be found in the care plan section) Progress towards PT goals: Progressing toward goals    Frequency    Min 3X/week      PT Plan Discharge plan needs to be updated    Co-evaluation  AM-PAC PT "6 Clicks" Mobility   Outcome Measure  Help needed turning from your back to your side while in a flat bed without using bedrails?: None Help needed moving from lying on your back to sitting on the side of a flat bed without using bedrails?: None Help needed moving to and from a bed to a chair (including a wheelchair)?: None Help needed standing up from a chair using your arms (e.g., wheelchair or bedside chair)?: None Help needed to walk in hospital room?: A Little Help needed climbing 3-5 steps with a railing? : A Little 6 Click Score: 22    End of Session Equipment Utilized During Treatment: Gait belt;Cervical collar Activity Tolerance: Patient tolerated treatment well Patient left: in chair;with call bell/phone within  reach;with nursing/sitter in room Nurse Communication: Mobility status PT Visit Diagnosis: Other abnormalities of gait and mobility (R26.89);Unsteadiness on feet (R26.81);Muscle weakness (generalized) (M62.81)     Time: 0370-9643 PT Time Calculation (min) (ACUTE ONLY): 20 min  Charges:  $Gait Training: 8-22 mins                     Erline Levine, PTA Acute Rehabilitation Services Pager: [phone] Office: [phone]     Carolynne Edouard 02/21/2018, 12:42 PM

## 2018-02-21 NOTE — Social Work (Addendum)
5:00pm- Pt under new IVC paperwork complete  2:52pm- Aware pt now progressing and can ambulate safely.  Spoke with pt at bedside, let him know he is under IVC and now that he has medically progressed we feel like we are able to make referral to [facility]. He had several questions about what services [facility] provides.   Questions answered, referral called into Tina at [facility].  Await information regarding bed availability.   Octavio Graves, MSW, [facility] Clinical Social Work [phone]

## 2018-02-22 DIAGNOSIS — T189XXD Foreign body of alimentary tract, part unspecified, subsequent encounter: Secondary | ICD-10-CM

## 2018-02-22 MED ORDER — LISINOPRIL 5 MG PO TABS
5.0000 mg | ORAL_TABLET | Freq: Every day | ORAL | Status: DC
  Administered 2018-02-22 – 2018-02-24 (×3): 5 mg via ORAL
  Filled 2018-02-22 (×3): qty 1

## 2018-02-22 MED ORDER — TRAMADOL HCL 50 MG PO TABS: 50.0000 mg | ORAL_TABLET | Freq: Two times a day (BID) | ORAL | Status: DC | PRN

## 2018-02-22 MED ORDER — CHLORHEXIDINE GLUCONATE 0.12 % MT SOLN
OROMUCOSAL | Status: AC
  Filled 2018-02-22: qty 15

## 2018-02-22 NOTE — Progress Notes (Signed)
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 -On further evaluation he was noted to have facial bone fractures, C-spine herniation and foreign body ingestion, soft tissue injuries to both arms, UDS was positive for benzodiazepines. -Underwent EGD with removal of foreign objects 2/18 -Extubated 2/19 -Also seen by neurosurgery for cervical disc herniation on CT recommended soft cervical collar and outpatient follow-up -Transferred from PCCM to [facility] service 2/20, Psych consulted, [facility] recommended  Assessment & Plan:   Psychosis/agitation -Prior history of [facility] hospitalization in 2013 with severe paranoia and delusional disorder -Sedated and intubated on admission due to severe agitation and combativeness -Extubated, off sedation, more calm now -Psych consult appreciated, started on Risperdal patient taking this only intermittently -Involuntarily committed while in ICU, social work following -Medically stable for discharge to inpatient psych, awaiting bed at [facility]  Foreign body ingestion -In the setting of above -Underwent EGD 2/18 with removal of 2 keys, brush, jump drive from his stomach -Continue PPI  Acute hypoxic respiratory failure -resolved, extubated 2/19, wean off  O2  C5-C6 central disc bulge/herniation -Appreciate neurosurgery consult by Dr. Trenton Gammon, soft collar recommended an outpatient follow-up -Stable  History of congenital AV block, cardiomyopathy -Resume Coreg  Chronic pain -Resume tramadol, pharmacy to review home meds  DVT prophylaxis: Lovenox Code Status: Full code Family Communication: No family at bedside Disposition Plan: Inpatient psych when mobility improves  Consultants:   PCCM  Neurosurgery  GI   Procedures: EGD with foreign body retrieval 2/18  Antimicrobials:    Subjective: -Wants to go home instead of [facility], educated again about psych recommendations  Objective: Vitals:   02/21/18 0500 02/21/18 1442 02/21/18 2112 02/22/18 0532  BP:  119/78 137/82 (!) 134/96  Pulse:  77 70 95  Resp: _0 Temp:  99.2 F (37.3 C) 98 F (36.7 C) 98.6 F (37 C)  TempSrc:  Oral Oral   SpO2:  99% 100% 100%  Weight: 52.3 kg     Height:        Intake/Output Summary (Last 24 hours) at 02/22/2018 1202 Last data filed at 02/22/2018 0830 Gross per 24 hour  Intake 1880 ml  Output -  Net 1880 ml   Filed Weights   02/19/18 2133 02/20/18 0500 02/21/18 0500  Weight: 52.3 kg 52.3 kg 52.3 kg    Examination:  Gen: Awake, Alert, Oriented X 3, sitting in recliner, c-collar on, no distress HEENT: Facial and forehead soft tissue swelling Lungs: Good air movement bilaterally, CTAB CVS: RRR,No Gallops,Rubs or new Murmurs Abd: soft, Non tender, non distended, BS present Extremities: No Cyanosis, Clubbing or edema Skin: no new rashes Psychiatry: Flat affect    Data Reviewed:   CBC: Recent Labs  Lab 02/17/18 2218  02/18/18 0358 02/18/18 0426 02/18/18 1052 02/19/18 0337 02/21/18 0640  WBC 9.3  --   --  9.1  --  7.5 4.4  NEUTROABS 7.4  --   --   --   --   --   --   HGB 14.5   < > 12.2* 13.3 13.3 12.2* 11.4*  HCT 43.2   < > 36.0* 39.7 39.0 37.2* 33.9*  MCV 88.9  --   --  89.6  --  89.9 89.4  PLT 259  --   --  234   --  199 232   < > = values in this interval not displayed.   Basic Metabolic Panel: Recent Labs  Lab 02/17/18 2218  02/18/18 0136 02/18/18 0358 02/18/18 0426 02/18/18 1052 02/19/18 0337 02/21/18 0640  NA 136   < > 138 138 138 139 139 139  K 3.5   < > 3.9 4.0 4.3 3.8 3.9 4.0  CL 101  --  106  --  106  --  109 106  CO2 23  --  24  --  21*  --  23 27  GLUCOSE 80  --  109*  --  90  --  91 105*  BUN 14  --  14  --  15  --  19 9  CREATININE 1.28*  --  1.22  --  1.35*  --  1.73* 0.97  CALCIUM 9.6  --  8.4*  --  8.7*  --  8.2* 8.2*  MG  --   --   --   --  2.7*  --   --   --   PHOS  --   --   --   --  5.0*  --  4.2  --    < > = values in this interval not displayed.   GFR: Estimated Creatinine Clearance: 83.9 mL/min (by C-G formula based on SCr of 0.97 mg/dL). Liver Function Tests: Recent Labs  Lab 02/17/18 2218 02/18/18 0136 02/19/18 0337  AST 62* 62*  --   ALT 26 26  --   ALKPHOS 56 51  --   BILITOT 1.2 1.2  --   PROT 7.3 6.5  --   ALBUMIN 4.2 3.5 2.9*   No results for input(s): LIPASE, AMYLASE in the last 168 hours. No results for input(s): AMMONIA in the last 168 hours. Coagulation Profile: No results for input(s): INR, PROTIME in the last 168 hours. Cardiac Enzymes: No results for input(s): CKTOTAL, CKMB, CKMBINDEX, TROPONINI in the last 168 hours. BNP (last 3 results) No results for input(s): PROBNP in the last 8760 hours. HbA1C: No results for input(s): HGBA1C in the last 72 hours. CBG: Recent Labs  Lab 02/18/18 2333 02/19/18 0324 02/19/18 0733 02/19/18 1513 02/19/18 1946  GLUCAP 67* 75 99 127* 166*   Lipid Profile: No results for input(s): CHOL, HDL, LDLCALC, TRIG, CHOLHDL, LDLDIRECT in the last 72 hours. Thyroid Function Tests: No results for input(s): TSH, T4TOTAL, FREET4, T3FREE, THYROIDAB in the last 72 hours. Anemia Panel: No results for input(s): VITAMINB12, FOLATE, FERRITIN, TIBC, IRON, RETICCTPCT in the last 72 hours. Urine analysis:     Component Value Date/Time   COLORURINE YELLOW 08/12/2015 1507   APPEARANCEUR CLEAR 08/12/2015 1507   LABSPEC 1.030 08/12/2015 1507   PHURINE 5.5 08/12/2015 1507   GLUCOSEU NEGATIVE 08/12/2015 1507   HGBUR NEGATIVE 08/12/2015 1507   BILIRUBINUR NEGATIVE 08/12/2015 1507   KETONESUR 15 (A) 08/12/2015 1507   PROTEINUR NEGATIVE 08/12/2015 1507   UROBILINOGEN 1.0 03/15/2011 2044   NITRITE NEGATIVE 08/12/2015 1507   LEUKOCYTESUR NEGATIVE 08/12/2015 1507     Sepsis Labs: _0 (procalcitonin:4,lacticidven:4)  ) Recent Results (from the past 240 hour(s))  Culture, blood (routine x 2)     Status: None (Preliminary result)   Collection Time: 02/18/18  4:26 AM  Result Value Ref Range Status   Specimen Description BLOOD RIGHT HAND  Final   Special Requests   Final    BOTTLES DRAWN AEROBIC AND ANAEROBIC Blood Culture adequate volume   Culture NO GROWTH 3 DAYS  Final   Report Status PENDING  Incomplete  Culture, blood (routine x 2)     Status: None (Preliminary result)   Collection Time: 02/18/18  4:26 AM  Result Value Ref Range Status   Specimen Description BLOOD LEFT HAND  Final   Special Requests   Final    BOTTLES DRAWN AEROBIC ONLY Blood Culture results may not be optimal due to an excessive volume of blood received in culture bottles   Culture NO GROWTH 3 DAYS  Final   Report Status PENDING  Incomplete  MRSA PCR Screening     Status: None   Collection Time: 02/18/18  8:13 AM  Result Value Ref Range Status   MRSA by PCR NEGATIVE NEGATIVE Final    Comment:        The GeneXpert MRSA Assay (FDA approved for NASAL specimens only), is one component of a comprehensive MRSA colonization surveillance program. It is not intended to diagnose MRSA infection nor to guide or monitor treatment for MRSA infections. Performed at [facility], [place] [address], [place], [place] [address]          Radiology Studies: No results found.      Scheduled Meds: . carvedilol   3.125 mg Oral BID WC  . chlorhexidine gluconate (MEDLINE KIT)  15 mL Mouth Rinse BID  . enoxaparin (LOVENOX) injection  40 mg Subcutaneous Q24H  . lisinopril  5 mg Oral Daily  . pantoprazole  40 mg Oral Q1200  . risperiDONE  1 mg Oral BID   Continuous Infusions:   LOS: 4 days    Time spent: 9mn    PDomenic Polite MD [facility] 02/22/2018, 12:02 PM

## 2018-02-23 LAB — CULTURE, BLOOD (ROUTINE X 2)
CULTURE: NO GROWTH
Culture: NO GROWTH
SPECIAL REQUESTS: ADEQUATE

## 2018-02-23 NOTE — Progress Notes (Signed)
Patient seen walking in the halls with his sitter, in no distress, has this soft c-collar on. -Remains medically stable for DC awaiting bed at [facility] -Called and discussed with social worker for discharge  Zannie Cove, MD

## 2018-02-23 NOTE — Progress Notes (Addendum)
CSW followed up with Inetta Fermo at [facility] for placement of patient.  [facility] will follow up with CSW on placement.  CSW will continue to follow for disposition.   Budd Palmer LCSWA [phone]

## 2018-02-24 ENCOUNTER — Other Ambulatory Visit: Payer: Self-pay

## 2018-02-24 ENCOUNTER — Encounter (HOSPITAL_COMMUNITY): Payer: Self-pay | Admitting: *Deleted

## 2018-02-24 ENCOUNTER — Inpatient Hospital Stay (HOSPITAL_COMMUNITY)
Admission: AD | Admit: 2018-02-24 | Discharge: 2018-02-26 | DRG: 885 | Disposition: A | Discharge status: Home / Self Care | Payer: Medicaid Other | Source: Intra-hospital | Attending: Psychiatry | Admitting: Psychiatry

## 2018-02-24 DIAGNOSIS — Z83511 Family history of glaucoma: Secondary | ICD-10-CM | POA: Diagnosis not present

## 2018-02-24 DIAGNOSIS — F250 Schizoaffective disorder, bipolar type: Secondary | ICD-10-CM | POA: Diagnosis not present

## 2018-02-24 DIAGNOSIS — J45909 Unspecified asthma, uncomplicated: Secondary | ICD-10-CM | POA: Diagnosis present

## 2018-02-24 DIAGNOSIS — Z813 Family history of other psychoactive substance abuse and dependence: Secondary | ICD-10-CM | POA: Diagnosis not present

## 2018-02-24 DIAGNOSIS — Z8249 Family history of ischemic heart disease and other diseases of the circulatory system: Secondary | ICD-10-CM | POA: Diagnosis not present

## 2018-02-24 DIAGNOSIS — S130XXD Traumatic rupture of cervical intervertebral disc, subsequent encounter: Secondary | ICD-10-CM

## 2018-02-24 DIAGNOSIS — F23 Brief psychotic disorder: Secondary | ICD-10-CM | POA: Diagnosis not present

## 2018-02-24 DIAGNOSIS — Z91013 Allergy to seafood: Secondary | ICD-10-CM | POA: Diagnosis not present

## 2018-02-24 DIAGNOSIS — F17210 Nicotine dependence, cigarettes, uncomplicated: Secondary | ICD-10-CM | POA: Diagnosis present

## 2018-02-24 DIAGNOSIS — F22 Delusional disorders: Secondary | ICD-10-CM | POA: Diagnosis present

## 2018-02-24 DIAGNOSIS — W2201XD Walked into wall, subsequent encounter: Secondary | ICD-10-CM | POA: Diagnosis not present

## 2018-02-24 DIAGNOSIS — K219 Gastro-esophageal reflux disease without esophagitis: Secondary | ICD-10-CM | POA: Diagnosis present

## 2018-02-24 DIAGNOSIS — G4700 Insomnia, unspecified: Secondary | ICD-10-CM | POA: Diagnosis present

## 2018-02-24 DIAGNOSIS — H3530 Unspecified macular degeneration: Secondary | ICD-10-CM | POA: Diagnosis present

## 2018-02-24 DIAGNOSIS — Z833 Family history of diabetes mellitus: Secondary | ICD-10-CM | POA: Diagnosis not present

## 2018-02-24 DIAGNOSIS — I10 Essential (primary) hypertension: Secondary | ICD-10-CM | POA: Diagnosis present

## 2018-02-24 DIAGNOSIS — Z91040 Latex allergy status: Secondary | ICD-10-CM

## 2018-02-24 DIAGNOSIS — F29 Unspecified psychosis not due to a substance or known physiological condition: Secondary | ICD-10-CM | POA: Diagnosis present

## 2018-02-24 DIAGNOSIS — Z791 Long term (current) use of non-steroidal anti-inflammatories (NSAID): Secondary | ICD-10-CM | POA: Diagnosis not present

## 2018-02-24 DIAGNOSIS — Z888 Allergy status to other drugs, medicaments and biological substances status: Secondary | ICD-10-CM | POA: Diagnosis not present

## 2018-02-24 DIAGNOSIS — T189XXD Foreign body of alimentary tract, part unspecified, subsequent encounter: Secondary | ICD-10-CM | POA: Diagnosis not present

## 2018-02-24 DIAGNOSIS — M549 Dorsalgia, unspecified: Secondary | ICD-10-CM | POA: Diagnosis present

## 2018-02-24 DIAGNOSIS — Z950 Presence of cardiac pacemaker: Secondary | ICD-10-CM

## 2018-02-24 DIAGNOSIS — G8929 Other chronic pain: Secondary | ICD-10-CM | POA: Diagnosis present

## 2018-02-24 DIAGNOSIS — F419 Anxiety disorder, unspecified: Secondary | ICD-10-CM | POA: Diagnosis present

## 2018-02-24 DIAGNOSIS — Z79899 Other long term (current) drug therapy: Secondary | ICD-10-CM | POA: Diagnosis not present

## 2018-02-24 DIAGNOSIS — S0240DA Maxillary fracture, left side, initial encounter for closed fracture: Secondary | ICD-10-CM

## 2018-02-24 DIAGNOSIS — Z818 Family history of other mental and behavioral disorders: Secondary | ICD-10-CM | POA: Diagnosis not present

## 2018-02-24 DIAGNOSIS — T182XXA Foreign body in stomach, initial encounter: Secondary | ICD-10-CM | POA: Diagnosis not present

## 2018-02-24 MED ORDER — RISPERIDONE 1 MG PO TABS: 1.0000 mg | ORAL_TABLET | Freq: Two times a day (BID) | ORAL | 0 refills | Status: DC

## 2018-02-24 MED ORDER — TRAZODONE HCL 50 MG PO TABS: 50.0000 mg | ORAL_TABLET | Freq: Every evening | ORAL | Status: DC | PRN

## 2018-02-24 MED ORDER — HYDROXYZINE HCL 25 MG PO TABS: 25.0000 mg | ORAL_TABLET | Freq: Three times a day (TID) | ORAL | Status: DC | PRN

## 2018-02-24 MED ORDER — RISPERIDONE 1 MG PO TABS
1.0000 mg | ORAL_TABLET | Freq: Two times a day (BID) | ORAL | Status: DC
  Filled 2018-02-24 (×4): qty 1

## 2018-02-24 MED ORDER — EPINEPHRINE 0.3 MG/0.3ML IJ SOAJ: 0.3000 mg | Freq: Once | INTRAMUSCULAR | Status: DC | PRN

## 2018-02-24 MED ORDER — PANTOPRAZOLE SODIUM 40 MG PO TBEC
40.0000 mg | DELAYED_RELEASE_TABLET | Freq: Every day | ORAL | Status: DC
  Administered 2018-02-25 – 2018-02-26 (×2): 40 mg via ORAL
  Filled 2018-02-24 (×2): qty 1

## 2018-02-24 MED ORDER — RISPERIDONE 1 MG PO TABS: 1.0000 mg | ORAL_TABLET | Freq: Two times a day (BID) | ORAL | Status: DC

## 2018-02-24 MED ORDER — PANTOPRAZOLE SODIUM 40 MG PO TBEC: 40.0000 mg | DELAYED_RELEASE_TABLET | Freq: Every day | ORAL | Status: DC

## 2018-02-24 MED ORDER — ALUM & MAG HYDROXIDE-SIMETH 200-200-20 MG/5ML PO SUSP: 30.0000 mL | ORAL | Status: DC | PRN

## 2018-02-24 MED ORDER — LISINOPRIL 5 MG PO TABS
5.0000 mg | ORAL_TABLET | Freq: Every day | ORAL | Status: DC
  Administered 2018-02-25 – 2018-02-26 (×2): 5 mg via ORAL
  Filled 2018-02-24 (×4): qty 1

## 2018-02-24 MED ORDER — MAGNESIUM HYDROXIDE 400 MG/5ML PO SUSP: 30.0000 mL | Freq: Every day | ORAL | Status: DC | PRN

## 2018-02-24 NOTE — Social Work (Addendum)
Clinical Social Worker facilitated patient discharge.   Clinical information and IVC faxed to facility and family agreeable with plan.  CSW arranged [facility] police transport to [facility]. Pick up will be at 3:30pm. RN to call [phone]  with report prior to discharge.  Referring MD- Dr. Sharma Covert Accepting MD- Dr. Sharma Covert Attending MD- Dr. Otelia Santee Pt will go to bed 506-1.  RN aware, MD working on discharge.   Octavio Graves, MSW, [facility] Clinical Social Worker [phone]

## 2018-02-24 NOTE — Progress Notes (Signed)
Occupational Therapy Treatment Patient Details Name: Mario Wyatt MRN: [id number] DOB: 11-05-1990 Today's Date: 02/24/2018    History of present illness Pt is a 28 yo s/p psychosis requiring tasing in ER due to combativeness. Before coming in, Pt ate USB and key requiring surgery to remove items 02/19/18. Pt with facial fractures and negative CT head. C5-6 showed bulging disc and neurosurgery will f/up OP. Pt reported numbness in L hand on ulnar side. No L hand fx on scans.   OT comments  Pt progressing towards OT goals at this time. Now mod I for all aspects of grooming dressing, transfers. Pt is confident in his ability to don/doff brace (soft collar). Next session to focus on higher level/executive level thinking and management skills (especially with regard to medicine management) if he can manage than likely dc from OT services. Pt very pleasant and cooperative throughout session.   Of Note: Pt perseverating on the fact that his girlfriend may be trying to take advantage of him.  Follow Up Recommendations  Other (comment)(plans to dc to [facility])    Equipment Recommendations  None recommended by OT    Recommendations for Other Services Other (comment)(psychiatry)    Precautions / Restrictions Precautions Precautions: Other (comment) Precaution Comments: psych history Required Braces or Orthoses: Cervical Brace Cervical Brace: Soft collar;For comfort Restrictions Weight Bearing Restrictions: No       Mobility Bed Mobility               General bed mobility comments: OOB in recliner at beginning and end of session  Transfers Overall transfer level: Independent               General transfer comment: no difficulty    Balance Overall balance assessment: No apparent balance deficits (not formally assessed)                                         ADL either performed or assessed with clinical judgement   ADL Overall ADL's : Modified  independent                                       General ADL Comments: no assist or cues needed throughout session - keeping on for higher level executive thinking (medication management etc)     Vision   Vision Assessment?: No apparent visual deficits   Perception     Praxis      Cognition Arousal/Alertness: Awake/alert Behavior During Therapy: WFL for tasks assessed/performed Overall Cognitive Status: No family/caregiver present to determine baseline cognitive functioning                                 General Comments: Pt cooperative and very pleasant throughout. Pt stated several times that he is worried about his girlfriend taking advantage of his IVC status - but that he wants to complete the process for doing things right. He is grateful for everything the hospital has done        Exercises     Shoulder Instructions       General Comments VSS    Pertinent Vitals/ Pain       Pain Assessment: Faces Faces Pain Scale: No hurt Pain Intervention(s): Monitored during session  Home Living  Prior Functioning/Environment              Frequency  Min 2X/week        Progress Toward Goals  OT Goals(current goals can now be found in the care plan section)  Progress towards OT goals: Progressing toward goals  Acute Rehab OT Goals Patient Stated Goal: go home OT Goal Formulation: With patient Time For Goal Achievement: 03/06/18 Potential to Achieve Goals: Good ADL Goals Pt Will Perform Grooming: with modified independence;standing Pt Will Perform Upper Body Dressing: Independently Pt Will Perform Lower Body Dressing: Independently;sit to/from stand Pt Will Transfer to Toilet: with modified independence;ambulating Additional ADL Goal #1: Pt will demonstrate executive level thinking and safe medicine management skills at supervision level Additional ADL Goal #2: Pt will  demonstrate navigational skills (executive thinking) and get from cafeteria to room with 2 cues or less  Plan Discharge plan remains appropriate;Frequency remains appropriate    Co-evaluation                 AM-PAC OT "6 Clicks" Daily Activity     Outcome Measure   Help from another person eating meals?: None Help from another person taking care of personal grooming?: None Help from another person toileting, which includes using toliet, bedpan, or urinal?: None Help from another person bathing (including washing, rinsing, drying)?: None Help from another person to put on and taking off regular upper body clothing?: None Help from another person to put on and taking off regular lower body clothing?: None 6 Click Score: 24    End of Session Equipment Utilized During Treatment: Cervical collar  OT Visit Diagnosis: Other symptoms and signs involving cognitive function   Activity Tolerance Patient tolerated treatment well   Patient Left in chair;with call bell/phone within reach;with nursing/sitter in room   Nurse Communication Mobility status        Time: 7579-7282 OT Time Calculation (min): 16 min  Charges: OT General Charges $OT Visit: 1 Visit OT Treatments $Self Care/Home Management : 8-22 mins  Sherryl Manges OTR/L Acute Rehabilitation Services Pager: [phone] Office: [phone]   Evern Bio Sherrick Araki 02/24/2018, 4:08 PM

## 2018-02-24 NOTE — Progress Notes (Signed)
Mario Wyatt is a 28 year old male pt admitted on involuntary basis. On admission, he reports that he remembers that he was barricading himself in his place and reports that it was to keep his girlfriend out and he reports that she is mean to him and out to get him. He denies SI and is able to contract for safety while in the hospital. He reports that he believes he was in a psychotic state because he was prescribed hydrocodone and it interacted with his other medications. When asked about the benzos being in his system he denies any usage and reports that he thinks his girlfriend gave him something. He denies any substance abuse. He reports that he has his own place and will return there once he is discharged. He was escorted to the unit, oriented to the milieu and safety maintained.

## 2018-02-24 NOTE — Tx Team (Signed)
Initial Treatment Plan 02/24/2018 6:19 PM Mario Wyatt HUT:[id number]    PATIENT STRESSORS: Marital or family conflict Medication change or noncompliance   PATIENT STRENGTHS: Ability for insight Average or above average intelligence Capable of independent living General fund of knowledge Motivation for treatment/growth   PATIENT IDENTIFIED PROBLEMS: Psychosis "I was in a psychotic state" "I don't want to kill myself, I love myself"                     DISCHARGE CRITERIA:  Ability to meet basic life and health needs Improved stabilization in mood, thinking, and/or behavior Verbal commitment to aftercare and medication compliance  PRELIMINARY DISCHARGE PLAN: Attend aftercare/continuing care group Return to previous living arrangement  PATIENT/FAMILY INVOLVEMENT: This treatment plan has been presented to and reviewed with the patient, Mario Wyatt, and/or family member, .  The patient and family have been given the opportunity to ask questions and make suggestions.  Terrez Ander, [place], [place] 02/24/2018, 6:19 PM

## 2018-02-24 NOTE — Progress Notes (Signed)
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 Before coming in, Pt ate USB and key requiring surgery to remove items 02/19/18. Pt with facial fractures and negative CT head. C5-6 showed bulging disc and neurosurgery will f/up OP. Pt reported numbness in L hand on ulnar side. No L hand fx on scans.    PT Comments    Pt functioning at baseline from mobility stand point. Pt scored a 24/24 on DGI indicated minimal falls risk. Pt functioning at supervision level due to active psychosis but is indep from mobility stand point.  At this time pt with no further acute PT needs at this time. PT SIGNING OFF. Please re-consult if needed in future.  Follow Up Recommendations  (appears pt is going to [facility] due to active psychosis)     Equipment Recommendations  None recommended by PT    Recommendations for Other Services       Precautions / Restrictions Precautions Precautions: Other (comment) Precaution Comments: psych history Required Braces or Orthoses: Cervical Brace Cervical Brace: Soft collar;For comfort Restrictions Weight Bearing Restrictions: No    Mobility  Bed Mobility Overal bed mobility: Independent                Transfers Overall transfer level: Independent               General transfer comment: no difficulty, standing up at bedside using his phone upon PT entry  Ambulation/Gait Ambulation/Gait assistance: Independent Gait Distance (Feet): 700 Feet Assistive device: None Gait Pattern/deviations: WFL(Within Functional Limits) Gait velocity: wfl Gait velocity interpretation: >4.37 ft/sec, indicative of normal walking speed General Gait Details: no episodes of LOB, pt able to maintain balance despite max pertebations at shoulders and hips in all directions.     Stairs Stairs: Yes Stairs assistance: Supervision Stair Management: No rails;Alternating pattern;Forwards Number of Stairs: 12 General stair comments: no difficulty   Wheelchair Mobility    Modified Rankin (Stroke Patients Only)       Balance Overall balance assessment: No apparent balance deficits (not formally assessed)                               Standardized Balance Assessment Standardized Balance Assessment : Dynamic Gait Index   Dynamic Gait Index Level Surface: Normal Change in Gait Speed: Normal Gait with Horizontal Head Turns: Normal Gait with Vertical Head Turns: Normal Gait and Pivot Turn: Normal Step Over Obstacle: Normal Step Around Obstacles: Normal Steps: Normal Total Score: 24      Cognition Arousal/Alertness: Awake/alert Behavior During Therapy: WFL for tasks assessed/performed Overall Cognitive Status: No family/caregiver present to determine baseline cognitive functioning                                 General Comments: pt much improved today. i did not ask why he was in the hospital but he states he isn't in a rush to get home he just wants to get better      Exercises      General Comments General comments (skin integrity, edema, etc.): vss      Pertinent Vitals/Pain Pain Assessment: 0-10 Pain Score: 1  Pain Location: headache Pain Descriptors / Indicators: Headache Pain Intervention(s):  Monitored during session    Home Living                      Prior Function            PT Goals (current goals can now be found in the care plan section) Acute Rehab PT Goals Patient Stated Goal: go home Progress towards PT goals: Goals met/education completed, patient discharged from PT    Frequency    Min 3X/week      PT Plan Current plan remains appropriate;Frequency needs to be updated    Co-evaluation              AM-PAC PT "6 Clicks" Mobility   Outcome Measure  Help needed  turning from your back to your side while in a flat bed without using bedrails?: None Help needed moving from lying on your back to sitting on the side of a flat bed without using bedrails?: None Help needed moving to and from a bed to a chair (including a wheelchair)?: None Help needed standing up from a chair using your arms (e.g., wheelchair or bedside chair)?: None Help needed to walk in hospital room?: None Help needed climbing 3-5 steps with a railing? : None 6 Click Score: 24    End of Session Equipment Utilized During Treatment: Gait belt;Cervical collar Activity Tolerance: Patient tolerated treatment well Patient left: in chair;with call bell/phone within reach;with nursing/sitter in room Nurse Communication: Mobility status PT Visit Diagnosis: Other abnormalities of gait and mobility (R26.89);Unsteadiness on feet (R26.81);Muscle weakness (generalized) (M62.81)     Time: 5749-3552 PT Time Calculation (min) (ACUTE ONLY): 14 min  Charges:  $Gait Training: 8-22 mins                     Kittie Plater, PT, DPT Acute Rehabilitation Services Pager #: [phone] Office #: [phone]    Berline Lopes 02/24/2018, 9:16 AM

## 2018-02-24 NOTE — Discharge Summary (Addendum)
Physician Discharge Summary  Mario Wyatt IPJ:[id number] DOB: [birth year] DOA: 02/17/2018  PCP: Verlon Au, MD  Admit date: 02/17/2018 Discharge date: 02/24/2018  Time spent: 35 minutes  Recommendations for Outpatient Follow-up:  1. Discharge to behavioral health Hospital 2. PCP in 1 week 3. Neurosurgery Dr. 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   history of ADHD,    Anxiety     Foreign body alimentary tract, subsequent encounter   Gastric foreign body   Closed left maxillary fracture (HCC)   C5-C6 central disc bulge/herniation  Discharge Condition: Stable  Diet recommendation: Regular  Filed Weights   02/19/18 2133 02/20/18 0500 02/21/18 0500  Weight: 52.3 kg 52.3 kg 52.3 kg    History of present illness:  28 year old male with history of asthma, congenital complete AV block and cardiomyopathy status post pacemaker, prior history of ADHD, anxiety, hospitalized to [facility] in 2013 with paranoia and delusional disorder was brought to the emergency room on 2/17 by [employer] after he barricaded himself in his apartment, he was found banging his head against the door, all the drains in the house were stopped up and overflowing, patient had put light bulbs in the oven, subsequently brought to the ED by police officers patient was increasingly combative agitated prompting officers to tase him, he was sedated in the emergency room subsequently intubated. -On further evaluation he was noted to have facial bone fractures, C-spine herniation and foreign bodies/Metallic objects in his stomach, soft tissue injuries to both arms, UDS was positive for benzodiazepines.  Hospital Course:   Psychosis/agitation  -Prior history of [facility] hospitalization in 2013 with severe paranoia and delusional disorder -Sedated and intubated on admission due to  severe agitation and combativeness -Extubated, off sedation, more calm now -Psych consult appreciated, started on Risperdal patient taking this only intermittently -Felt to have schizophrenia with acute psychosis per psych eval -Involuntarily committed while in ICU -Medically stable for discharge to inpatient psych,  will discharge to [facility] today  Foreign body ingestion -On admission on CT/imaging was noted to have metallic objects in his stomach, which he likely ingested in the setting of psychosis  -Underwent EGD 2/18 with removal of 2 keys, brush, jump drive from his stomach -Continue PPI for few weeks  Acute hypoxic respiratory failure -resolved, extubated 2/19, wean off O2  C5-C6 central disc bulge/herniation -Appreciate neurosurgery consult, seen by by Dr. Dutch Quint, soft collar recommended and outpatient follow-up -Stable  History of congenital AV block, cardiomyopathy -Continue Coreg and lisinopril  Chronic pain -Resumed tramadol  Consultants:   PCCM  Neurosurgery  GI  Psychiatry   Procedures: EGD with foreign body retrieval 2/18    Discharge Exam: Vitals:   02/24/18 0757 02/24/18 1015  BP: 117/86 101/87  Pulse: 82   Resp:    Temp:    SpO2:      General: Alert awake oriented x3 Cardiovascular: S1-S2/regular rate rhythm Respiratory: Clear bilaterally  Discharge Instructions   Discharge Instructions    Diet - low sodium heart healthy   Complete by:  As directed    Diet general   Complete by:  As directed    Increase activity slowly   Complete by:  As directed      Allergies as of 02/24/2018      Reactions   Iodine Anaphylaxis   Shellfish Allergy Anaphylaxis   Latex  Hives, Rash      Medication List    TAKE these medications   acetaminophen 325 MG tablet Commonly known as:  TYLENOL Take 1-2 tablets (325-650 mg total) by mouth every 4 (four) hours as needed for mild pain. What changed:  Another medication with the same name was removed.  Continue taking this medication, and follow the directions you see here.   albuterol 108 (90 Base) MCG/ACT inhaler Commonly known as:  PROVENTIL HFA;VENTOLIN HFA Inhale 2 puffs into the lungs every 4 (four) hours as needed for wheezing or shortness of breath.   carvedilol 3.125 MG tablet Commonly known as:  COREG Take 1 tablet (3.125 mg total) by mouth 2 (two) times daily. Please make yearly appt with Dr.Klein for February for future refill1st attempt   EPINEPHrine 0.3 mg/0.3 mL Soaj injection Commonly known as:  EPIPEN 2-PAK Inject 0.3 mLs (0.3 mg total) into the muscle once.   ibuprofen 200 MG tablet Commonly known as:  ADVIL,MOTRIN Take 200 mg by mouth every 6 (six) hours as needed for mild pain or moderate pain.   lisinopril 5 MG tablet Commonly known as:  PRINIVIL,ZESTRIL Take 1 tablet (5 mg total) by mouth daily. Please make yearly appt with Dr. Graciela Husbands for February for future refills. 1st attempt   multivitamin with minerals tablet Take 1 tablet by mouth daily.   pantoprazole 40 MG tablet Commonly known as:  PROTONIX Take 1 tablet (40 mg total) by mouth daily at 12 noon. Start taking on:  February 25, 2018   risperiDONE 1 MG tablet Commonly known as:  RISPERDAL Take 1 tablet (1 mg total) by mouth 2 (two) times daily for 30 days.   traMADol 50 MG tablet Commonly known as:  ULTRAM Take 1 tablet (50 mg total) by mouth every 6 (six) hours as needed.      Allergies  Allergen Reactions  . Iodine Anaphylaxis  . Shellfish Allergy Anaphylaxis  . Latex Hives and Rash   Follow-up Information    Verlon Au, MD. Schedule an appointment as soon as possible for a visit in 1 week(s).   Specialty:  Family Medicine Contact information: [address] [facility] [place] [address] [phone]            The results of significant diagnostics from this hospitalization (including imaging, microbiology, ancillary and laboratory) are  listed below for reference.    Significant Diagnostic Studies: Ct Abdomen Pelvis Wo Contrast  Result Date: 02/18/2018 CLINICAL DATA:  Foreign body ingestion (keys) as seen on abdomen radiographs EXAM: CT ABDOMEN AND PELVIS WITHOUT CONTRAST TECHNIQUE: Multidetector CT imaging of the abdomen and pelvis was performed following the standard protocol without IV contrast. COMPARISON:  Same day abdomen radiographs FINDINGS: Lower chest: Pulmonary consolidation at the right lung base with air bronchograms. Right basilar atelectasis and/or pneumonia suspected. The left lung is clear. Hepatobiliary: No focal liver abnormality is seen. No gallstones, gallbladder wall thickening, or biliary dilatation. Pancreas: Unremarkable. No pancreatic ductal dilatation or surrounding inflammatory changes. Spleen: Normal in size without focal abnormality. Adrenals/Urinary Tract: The left adrenal gland and upper pole of kidney are obscured by metallic streak artifacts from the patient's foreign body ingestion. No obstructive uropathy or nephrolithiasis. The urinary bladder is unremarkable. Stomach/Bowel: Metallic streak artifacts consistent with patient's foreign body ingestion is identified along the posterior wall of the expected stomach. Gastric tube extends into the stomach. No bowel obstruction. Paucity of mesenteric fat limits further assessment. Vascular/Lymphatic: No significant vascular findings  are present. No enlarged abdominal or pelvic lymph nodes. Reproductive: Prostate is unremarkable. Other: No abdominal wall hernia or abnormality. No abdominopelvic ascites. Musculoskeletal: No acute or significant osseous findings. IMPRESSION: 1. Pulmonary consolidation at the right lung base with air bronchograms. Right basilar atelectasis and/or pneumonia suspected. 2. Metallic streak artifacts emanating from the expected location of the stomach consistent with foreign body ingestion. No free air. Electronically Signed   By: Tollie Eth M.D.   On: 02/18/2018 02:15   Dg Abd 1 View  Result Date: 02/18/2018 CLINICAL DATA:  Post EGD. Removal of gastric foreign bodies. Confirm removal of foreign bodies. EXAM: ABDOMEN - 1 VIEW COMPARISON:  02/17/2018 radiographs FINDINGS: The metallic foreign bodies projecting over the stomach are no longer present. Moderate stool retention is seen within the left colon. No free air is identified. Heart size is normal with right basilar consolidation, slightly improved in appearance. IMPRESSION: No retained unaccountable metallic radiopaque foreign body. No free air. Electronically Signed   By: Tollie Eth M.D.   On: 02/18/2018 19:25   Ct Head Wo Contrast  Result Date: 02/18/2018 CLINICAL DATA:  Patient presents after having barricaded himself in his apartment and hitting his head against a wall multiple times. EXAM: CT HEAD WITHOUT CONTRAST TECHNIQUE: Contiguous axial images were obtained from the base of the skull through the vertex without intravenous contrast. COMPARISON:  None. FINDINGS: Brain: No acute intracranial hemorrhage, midline shift or edema. No hydrocephalus. No extra-axial fluid collections. Midline fourth ventricle basal cisterns without effacement. Gray-white matter distinction is maintained. Vascular: No hyperdense vessel sign Skull: Fracture of the left lateral wall of the maxillary sinus extending into the posterior left maxilla. Asymmetry of the maxilla with bony deficiency along the posterior aspect of the right maxilla relative to left possibly related to old remote trauma or dental disease and extractions. Sinuses/Orbits: Moderate mucosal thickening of the maxillary sinuses with air-fluid level on the right. Soft tissue densities in the left nasal passage with ethmoid sinus mucosal thickening. The sphenoid sinus is clear. The frontal sinus is not pneumatized. Other: Hematoma of the forehead with moderate scalp and forehead soft tissue swelling noted. Hypodensity at the base of the  tongue is again noted possibly a thyroglossal duct cyst. IMPRESSION: 1. No acute intracranial abnormality. 2. Fracture of the left lateral wall of the maxillary sinus extending into the posterior left maxilla. Bony deficiency along the posterior aspect of the right maxilla relative to left possibly related to old trauma, dental disease and/or extractions. 3. Hematoma of the forehead with moderate scalp and forehead soft tissue swelling. Electronically Signed   By: Tollie Eth M.D.   On: 02/18/2018 02:09   Ct Cervical Spine Wo Contrast  Result Date: 02/18/2018 CLINICAL DATA:  Recent tooth extractions for which the patient typical out of pain meds. Patient began banging his head against a wall multiple times and presents with bruising beneath both eyes and swelling of the forehead. EXAM: CT CERVICAL SPINE WITHOUT CONTRAST TECHNIQUE: Multidetector CT imaging of the cervical spine was performed without intravenous contrast. Multiplanar CT image reconstructions were also generated. COMPARISON:  07/30/2016 FINDINGS: Alignment: Maintained cervical lordosis Skull base and vertebrae: Intact Soft tissues and spinal canal: The patient is intubated. A gastric tube is seen coiled in the oropharynx before projecting caudad into the esophagus. The tip is excluded on this study. Disc levels: C5-6 central to right central disc bulge impressing upon the thecal sac. Mild central canal stenosis at this level. No jumped or perched  facets. No significant foraminal encroachment. Upper chest: Negative Other: Redemonstration of a cyst at the base of the tongue possibly representing a thyroglossal duct cyst. This measures at least 2.5 cm on the sagittal view. IMPRESSION: 1. C5-6 central to right central disc bulge impressing upon the thecal sac. Mild central canal stenosis at this level. 2. The patient's gastric tube is coiled in the oropharynx before projecting caudad into the esophagus. The tip is excluded on this study. 3. No acute  cervical spine fracture. Electronically Signed   By: Tollie Eth M.D.   On: 02/18/2018 02:27   Dg Hand 2 View Left  Result Date: 02/18/2018 CLINICAL DATA:  Initial evaluation for acute injury, hit head on wall, pain. EXAM: LEFT HAND - 2 VIEW COMPARISON:  None. FINDINGS: Single oblique view of the left hand demonstrates no acute fracture or dislocation. Joint spaces maintained. Soft tissue irregularity at the ulnar aspect of the mid left hand, suggesting small soft tissue injury. No radiopaque foreign body. IMPRESSION: 1. No acute osseous abnormality. 2. Focal soft tissue irregularity at the ulnar aspect of the left hand, suggesting soft tissue injury. No radiopaque foreign body. Electronically Signed   By: Rise Mu M.D.   On: 02/18/2018 00:37   Dg Chest Portable 1 View  Result Date: 02/18/2018 CLINICAL DATA:  Initial evaluation for acute injury, struck head on wall. EXAM: PORTABLE CHEST 1 VIEW COMPARISON:  Prior radiograph from 04/05/2017 FINDINGS: Endotracheal tube in place with tip positioned 5 cm above the carina. Enteric tube courses into the abdomen. Left-sided pacemaker/AICD in place. Cardiac and mediastinal silhouettes are stable, and remain within normal limits. Lungs mildly hypoinflated. No focal infiltrates. No edema or effusion. No pneumothorax. Visualized osseous structures within normal limits. IMPRESSION: 1. Tip of endotracheal tube position 5 cm above the carina. 2. Enteric tube courses into the upper abdomen, nonvisualization of tip or side hole. 3. Clear lungs.  No other active cardiopulmonary disease. Electronically Signed   By: Rise Mu M.D.   On: 02/18/2018 00:09   Dg Abd Portable 1v  Result Date: 02/18/2018 CLINICAL DATA:  Initial evaluation for foreign body. EXAM: PORTABLE ABDOMEN - 1 VIEW COMPARISON:  None. FINDINGS: Enteric tube in place with tip overlying the gastric body, side hole just beyond the GE junction. Bowel gas pattern within normal limits.  Moderate retained stool within the colon. Metallic keys overlie the proximal stomach, suggestive of foreign body. Few adjacent subcentimeter linear densities. Additional adjacent rectangular electronic density, which also may reflect a foreign body. IMPRESSION: 1. Metallic keys and rectangular electronic object overlying the proximal stomach, which could reflect ingested foreign bodies. 2. Enteric tube in place with tip overlying the gastric body, side hole beyond the GE junction. 3. Nonobstructive bowel gas pattern. Electronically Signed   By: Rise Mu M.D.   On: 02/18/2018 00:42   Dg Hand Complete Right  Result Date: 02/18/2018 CLINICAL DATA:  Initial evaluation for acute injury, hit head on wall. EXAM: RIGHT HAND - COMPLETE 3+ VIEW COMPARISON:  None. FINDINGS: There is no evidence of fracture or dislocation. There is no evidence of arthropathy or other focal bone abnormality. Mild soft tissue irregularity at the ulnar aspect of the right hand. Additionally, mild soft tissue swelling overlies the metacarpal heads. IMPRESSION: 1. No acute osseus abnormality. 2. Mild soft tissue swelling overlying the metacarpal heads with additional soft tissue irregularity at the ulnar aspect of the hand, which could reflect acute soft tissue injury. No radiopaque foreign body. Electronically Signed  By: Rise Mu M.D.   On: 02/18/2018 00:32   Ct Maxillofacial Wo Contrast  Result Date: 02/18/2018 CLINICAL DATA:  Patient dental work a few days ago and has taken lot of pain meds. Swelling of the forehead with bruising under both eyes. EXAM: CT MAXILLOFACIAL WITHOUT CONTRAST TECHNIQUE: Multidetector CT imaging of the maxillofacial structures was performed. Multiplanar CT image reconstructions were also generated. COMPARISON:  None. FINDINGS: Osseous: No mandibular fracture or dislocation. The temporomandibular joints are maintained. The zygomatic arches are symmetric and intact. No nasal bone fracture.  Mild right-sided nasal septal spur and septal deviation. Orbits: Intact orbits. Sinuses: Nondisplaced fracture of the lateral wall of the left maxillary sinus, series 8/48 extending caudad and involving the posterior left maxilla. Multiple bilateral tooth extractions are noted involving the maxilla with defects along the posterior aspect of the maxilla bilaterally likely from molar extractions. Small air-fluid level in the right maxillary sinus. Soft tissue densities in the left nasal passage and partially opacifying the left ethmoid sinus. The frontal sinus is not pneumatized. The sphenoid sinus is clear. Mastoids are clear. Soft tissues: Moderate diffuse scalp soft tissue swelling with forehead hematoma better visualized on the head CT. Endotracheal and gastric tubes are visualized with mild coiling of the gastric tube in the oropharynx. Limited intracranial: Negative IMPRESSION: 1. Nondisplaced fracture of the lateral wall of the left maxillary sinus along its inferior aspect extending caudad and involving the posterior left maxilla. 2. Soft tissue densities in the left nasal passage and partially opacifying the left ethmoid sinus. 3. Moderate diffuse scalp soft tissue swelling with forehead hematoma better visualized on the head CT. Electronically Signed   By: Tollie Eth M.D.   On: 02/18/2018 02:23    Microbiology: Recent Results (from the past 240 hour(s))  Culture, blood (routine x 2)     Status: None   Collection Time: 02/18/18  4:26 AM  Result Value Ref Range Status   Specimen Description BLOOD RIGHT HAND  Final   Special Requests   Final    BOTTLES DRAWN AEROBIC AND ANAEROBIC Blood Culture adequate volume   Culture NO GROWTH 5 DAYS  Final   Report Status 02/23/2018 FINAL  Final  Culture, blood (routine x 2)     Status: None   Collection Time: 02/18/18  4:26 AM  Result Value Ref Range Status   Specimen Description BLOOD LEFT HAND  Final   Special Requests   Final    BOTTLES DRAWN AEROBIC  ONLY Blood Culture results may not be optimal due to an excessive volume of blood received in culture bottles   Culture NO GROWTH 5 DAYS  Final   Report Status 02/23/2018 FINAL  Final  MRSA PCR Screening     Status: None   Collection Time: 02/18/18  8:13 AM  Result Value Ref Range Status   MRSA by PCR NEGATIVE NEGATIVE Final    Comment:        The GeneXpert MRSA Assay (FDA approved for NASAL specimens only), is one component of a comprehensive MRSA colonization surveillance program. It is not intended to diagnose MRSA infection nor to guide or monitor treatment for MRSA infections. Performed at [facility], [address], [place], [place] [address]      Labs: Basic Metabolic Panel: Recent Labs  Lab 02/17/18 2218  02/18/18 0136 02/18/18 0358 02/18/18 0426 02/18/18 1052 02/19/18 0337 02/21/18 0640  NA 136   < > 138 138 138 139 139 139  K 3.5   < >  3.9 4.0 4.3 3.8 3.9 4.0  CL 101  --  106  --  106  --  109 106  CO2 23  --  24  --  21*  --  23 27  GLUCOSE 80  --  109*  --  90  --  91 105*  BUN 14  --  14  --  15  --  19 9  CREATININE 1.28*  --  1.22  --  1.35*  --  1.73* 0.97  CALCIUM 9.6  --  8.4*  --  8.7*  --  8.2* 8.2*  MG  --   --   --   --  2.7*  --   --   --   PHOS  --   --   --   --  5.0*  --  4.2  --    < > = values in this interval not displayed.   Liver Function Tests: Recent Labs  Lab 02/17/18 2218 02/18/18 0136 02/19/18 0337  AST 62* 62*  --   ALT 26 26  --   ALKPHOS 56 51  --   BILITOT 1.2 1.2  --   PROT 7.3 6.5  --   ALBUMIN 4.2 3.5 2.9*   No results for input(s): LIPASE, AMYLASE in the last 168 hours. No results for input(s): AMMONIA in the last 168 hours. CBC: Recent Labs  Lab 02/17/18 2218  02/18/18 0358 02/18/18 0426 02/18/18 1052 02/19/18 0337 02/21/18 0640  WBC 9.3  --   --  9.1  --  7.5 4.4  NEUTROABS 7.4  --   --   --   --   --   --   HGB 14.5   < > 12.2* 13.3 13.3 12.2* 11.4*  HCT 43.2   < > 36.0* 39.7 39.0 37.2* 33.9*   MCV 88.9  --   --  89.6  --  89.9 89.4  PLT 259  --   --  234  --  199 232   < > = values in this interval not displayed.   Cardiac Enzymes: No results for input(s): CKTOTAL, CKMB, CKMBINDEX, TROPONINI in the last 168 hours. BNP: BNP (last 3 results) No results for input(s): BNP in the last 8760 hours.  ProBNP (last 3 results) No results for input(s): PROBNP in the last 8760 hours.  CBG: Recent Labs  Lab 02/18/18 2333 02/19/18 0324 02/19/18 0733 02/19/18 1513 02/19/18 1946  GLUCAP 67* 75 99 127* 166*       Signed:  Zannie Cove MD.  [facility] 02/24/2018, 1:30 PM

## 2018-02-24 NOTE — BH Assessment (Signed)
Patient accepted to [facility] by Dr. Roosvelt Harps. The attending provider is Dr. Otelia Santee. Nursing report [phone]. Bed assignment is 506-1. The bed is not ready at this time. Will call when bed becomes available. LCSW Rutherford Nail) notified of patient's IVC and will fax the IVC.

## 2018-02-24 NOTE — Social Work (Signed)
Referral called to [place] at [facility].  Per call- "there are several pt's ahead of him, I will call you back."  Continue to follow. Pt under IVC as of 2/21.   Octavio Graves, MSW, [facility] Clinical Social Work [phone]

## 2018-02-25 DIAGNOSIS — F250 Schizoaffective disorder, bipolar type: Secondary | ICD-10-CM

## 2018-02-25 LAB — HEMOGLOBIN A1C
Hgb A1c MFr Bld: 5.5 % (ref 4.8–5.6)
Mean Plasma Glucose: 111.15 mg/dL

## 2018-02-25 LAB — TSH: TSH: 1.523 u[IU]/mL (ref 0.350–4.500)

## 2018-02-25 MED ORDER — GABAPENTIN 100 MG PO CAPS
100.0000 mg | ORAL_CAPSULE | Freq: Three times a day (TID) | ORAL | Status: DC
  Administered 2018-02-25 – 2018-02-26 (×4): 100 mg via ORAL
  Filled 2018-02-25 (×7): qty 1

## 2018-02-25 MED ORDER — RISPERIDONE 3 MG PO TABS
3.0000 mg | ORAL_TABLET | Freq: Every day | ORAL | Status: DC
  Filled 2018-02-25: qty 1
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
## 2018-02-25 NOTE — Plan of Care (Addendum)
Patient slept well last night, appetite is good, energy level is normal, and concentration is good. Denies SI HI AVH. Denies physical pain. Patient's goal is "communicating with other."  Patient is compliant with medication prescribed per provider. Safety is maintained with 15 minute checks as well as environmental checks. Will continue to monitor and provide support.  Problem: Education: Goal: Knowledge of [facility] Education information/materials will improve Outcome: Not Progressing Goal: Emotional status will improve Outcome: Not Progressing Goal: Mental status will improve Outcome: Not Progressing Goal: Verbalization of understanding the information provided will improve Outcome: Not Progressing   Problem: Activity: Goal: Interest or engagement in activities will improve Outcome: Not Progressing Goal: Sleeping patterns will improve Outcome: Not Progressing   Problem: Coping: Goal: Ability to verbalize frustrations and anger appropriately will improve Outcome: Not Progressing

## 2018-02-25 NOTE — Progress Notes (Signed)
Recreation Therapy Notes  INPATIENT RECREATION THERAPY ASSESSMENT  Patient Details Name: Mario Wyatt MRN: [id number] DOB: July 26, 1990 Today's Date: 02/25/2018       Information Obtained From: Patient  Able to Participate in Assessment/Interview: Yes  Patient Presentation: Alert  Reason for Admission (Per Patient): Other (Comments)(Pt stated he lost his job after 5 years, had his wisdom teeth pulled and was prescribed hydrocodon and it triggered psychosis.)  Patient Stressors: (Pt identified no triggers)  Coping Skills:   Music, Meditate, Deep Breathing, Other (Comment)(write poetry)  Leisure Interests (2+):  Games - Video games, Individual - Reading, Individual - Writing, Music - Write music(Writes poetry )  Frequency of Recreation/Participation: Other (Comment)(Daily)  Awareness of Community Resources:  Yes  Community Resources:  Library, Other (Comment)(Counselor)  Current Use: Yes  If no, Barriers?:    Expressed Interest in [employer] Information: No  [employer] of Residence:  Engineer, technical sales  Patient Main Form of Transportation: Set designer  Patient Strengths:  IT trainer; Intuition  Patient Identified Areas of Improvement:  Stop making excuses; work on setting small goals before doing big goals  Patient Goal for Hospitalization:  "continue to love myself"  Current SI (including self-harm):  No  Current HI:  No  Current AVH: No  Staff Intervention Plan: Group Attendance, Collaborate with Interdisciplinary Treatment Team  Consent to Intern Participation: N/A    Caroll Rancher, LRT/CTRS  Caroll Rancher A 02/25/2018, 11:55 AM
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
## 2018-02-25 NOTE — [facility] Suicide Risk Assessment (Signed)
[facility] Admission Suicide Risk Assessment   Nursing information obtained from:  Patient Demographic factors:  Male, Adolescent or young adult, Living alone Current Mental Status:  NA Loss Factors:  NA Historical Factors:  Prior suicide attempts, Family history of mental illness or substance abuse Risk Reduction Factors:  Positive therapeutic relationship, Positive coping skills or problem solving skills  Total Time spent with patient: 45 minutes Principal Problem:  Psychosis  diagnosis:  Active Problems:   Psychosis (HCC)  Subjective Data: Patient transferred to psychiatry for further stabilization and evaluation  Continued Clinical Symptoms:  Alcohol Use Disorder Identification Test Final Score (AUDIT): 3 The "Alcohol Use Disorders Identification Test", Guidelines for Use in Primary Care, Second Edition.  World Science writer [facility]). Score between 0-7:  no or low risk or alcohol related problems. Score between 8-15:  moderate risk of alcohol related problems. Score between 16-19:  high risk of alcohol related problems. Score 20 or above:  warrants further diagnostic evaluation for alcohol dependence and treatment.  Despite the drama and dangerousness of his psychotic presentation is actually calm coherent rational having no psychotic symptoms now see admission note  CLINICAL FACTORS:   Medical Diagnoses and Treatments/Surgeries  COGNITIVE FEATURES THAT CONTRIBUTE TO RISK:  None    SUICIDE RISK:   Minimal: No identifiable suicidal ideation.  Patients presenting with no risk factors but with morbid ruminations; may be classified as minimal risk based on the severity of the depressive symptoms  PLAN OF CARE: see orders  I certify that inpatient services furnished can reasonably be expected to improve the patient's condition.   Mario Johns, MD 02/25/2018, 10:52 AM

## 2018-02-25 NOTE — [facility] Group Notes (Signed)
[facility] LCSW Group Therapy Note  Date/Time: 02/25/18, 1100  Type of Therapy/Topic:  Group Therapy:  Feelings about Diagnosis  Participation Level:  Minimal   Mood:pleasant   Description of Group:    This group will allow patients to explore their thoughts and feelings about diagnoses they have received. Patients will be guided to explore their level of understanding and acceptance of these diagnoses. Facilitator will encourage patients to process their thoughts and feelings about the reactions of others to their diagnosis, and will guide patients in identifying ways to discuss their diagnosis with significant others in their lives. This group will be process-oriented, with patients participating in exploration of their own experiences as well as giving and receiving support and challenge from other group members.   Therapeutic Goals: 1. Patient will demonstrate understanding of diagnosis as evidence by identifying two or more symptoms of the disorder:  2. Patient will be able to express two feelings regarding the diagnosis 3. Patient will demonstrate ability to communicate their needs through discussion and/or role plays  Summary of Patient Progress:Pt came to group late, stayed briefly, left, returned again and again stayed only briefly.  CSW made a point to call on pt while he was in the room and he made several responses to CSW questions about symptoms and stigma. Did not appear able to sit down and participate for more than a few minutes at a time.         Therapeutic Modalities:   Cognitive Behavioral Therapy Brief Therapy Feelings Identification   Daleen Squibb, LCSW

## 2018-02-25 NOTE — Progress Notes (Signed)
Recreation Therapy Notes  Date: 2.25.20 Time: 1000 Location: [address] Dayroom  Group Topic: Triggers  Goal Area(s) Addresses:  Patient will identify their triggers.  Patient will identify how they face their triggers head on. Patient will identify how they avoid their triggers. Patient will identify how they can deal with triggers post d/c.  Behavioral Response: Engaged  Intervention:  Worksheet, pencils  Activity: Triggers.  Patients were to identify their 3 biggest triggers.  Patients were to then identify how they face their triggers head on and what actions they take to avoid their triggers.  Education: Communication, Discharge Planning  Education Outcome: Acknowledges understanding/In group clarification offered/Needs additional education.   Clinical Observations/Feedback:  Pt identified his triggers as people overstepping their boundaries, narcissism in people, being belittled and people trying to take advantage of him.  Pt expressed he avoids his triggers by distancing himself, meditation, thinking positive, patience or blocking them from his life.  Pt faces triggers head on by agreeing to disagree with person/move on to another subject, make them feel like they are right and show continuous love.      Caroll Rancher, LRT/CTRS    Caroll Rancher A 02/25/2018 11:24 AM

## 2018-02-26 DIAGNOSIS — F22 Delusional disorders: Secondary | ICD-10-CM

## 2018-02-26 LAB — PROLACTIN: Prolactin: 20.8 ng/mL — ABNORMAL HIGH (ref 4.0–15.2)

## 2018-02-26 MED ORDER — LISINOPRIL 5 MG PO TABS: 5.0000 mg | ORAL_TABLET | Freq: Every day | ORAL | 0 refills | Status: DC

## 2018-02-26 MED ORDER — GABAPENTIN 100 MG PO CAPS: 100.0000 mg | ORAL_CAPSULE | Freq: Three times a day (TID) | ORAL | 0 refills | Status: DC

## 2018-02-26 MED ORDER — TRAZODONE HCL 50 MG PO TABS: 50.0000 mg | ORAL_TABLET | Freq: Every evening | ORAL | 0 refills | Status: DC | PRN

## 2018-02-26 MED ORDER — RISPERIDONE 3 MG PO TABS: 3.0000 mg | ORAL_TABLET | Freq: Every day | ORAL | 0 refills | Status: DC

## 2018-02-26 MED ORDER — EPINEPHRINE 0.3 MG/0.3ML IJ SOAJ: 0.3000 mg | Freq: Once | INTRAMUSCULAR | 0 refills | Status: AC | PRN

## 2018-02-26 MED ORDER — PANTOPRAZOLE SODIUM 40 MG PO TBEC: 40.0000 mg | DELAYED_RELEASE_TABLET | Freq: Every day | ORAL | 0 refills | Status: DC

## 2018-02-26 MED ORDER — HYDROXYZINE HCL 25 MG PO TABS: 25.0000 mg | ORAL_TABLET | Freq: Three times a day (TID) | ORAL | 0 refills | Status: DC | PRN

## 2018-02-26 NOTE — Progress Notes (Signed)
Recreation Therapy Notes  Date: 2.26.20 Time: 1000 Location: 500 Hall Dayroom  Group Topic: Wellness  Goal Area(s) Addresses:  Patient will define components of whole wellness. Patient will verbalize benefit of whole wellness.  Behavioral Response:  None  Intervention:  Music   Activity:  Exercise.  LRT led patients in a series of stretches.  Each patient was given the opportunity to lead the group in an exercise of their choice.  Each patient was allowed to take water breaks as needed.  Patients were also encouraged to pay attention to any pains or sore areas of their body.  Education: Wellness, Building control surveyor.   Education Outcome: Acknowledges education/In group clarification offered/Needs additional education.   Clinical Observations/Feedback:  Pt did not participate in group.  Pt sat and observed.    Caroll Rancher, LRT/CTRS         Caroll Rancher A 02/26/2018 11:56 AM
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
## 2018-02-26 NOTE — Tx Team (Signed)
Interdisciplinary Treatment and Diagnostic Plan Update  02/26/2018 Time of Session: 0901 Mario Wyatt MRN: [id number]  Principal Diagnosis: Delusional disorder [facility])  Secondary Diagnoses: Principal Problem:   Delusional disorder (HCC) Active Problems:   Psychosis (HCC)   Current Medications:  Current Facility-Administered Medications  Medication Dose Route Frequency Provider Last Rate Last Dose  . alum & mag hydroxide-simeth (MAALOX/MYLANTA) 200-200-20 MG/5ML suspension 30 mL  30 mL Oral Q4H PRN Oneta Rack, NP      . EPINEPHrine (EPI-PEN) injection 0.3 mg  0.3 mg Intramuscular Once PRN Nira Conn A, NP      . gabapentin (NEURONTIN) capsule 100 mg  100 mg Oral TID Malvin Johns, MD   100 mg at 02/26/18 0810  . hydrOXYzine (ATARAX/VISTARIL) tablet 25 mg  25 mg Oral TID PRN Jackelyn Poling, NP      . lisinopril (PRINIVIL,ZESTRIL) tablet 5 mg  5 mg Oral Daily Nira Conn A, NP   5 mg at 02/26/18 0810  . magnesium hydroxide (MILK OF MAGNESIA) suspension 30 mL  30 mL Oral Daily PRN Oneta Rack, NP      . pantoprazole (PROTONIX) EC tablet 40 mg  40 mg Oral Q1200 Nira Conn A, NP   40 mg at 02/25/18 1156  . risperiDONE (RISPERDAL) tablet 3 mg  3 mg Oral QHS Malvin Johns, MD      . traZODone (DESYREL) tablet 50 mg  50 mg Oral QHS PRN Oneta Rack, NP       PTA Medications: Medications Prior to Admission  Medication Sig Dispense Refill Last Dose  . acetaminophen (TYLENOL) 325 MG tablet Take 1-2 tablets (325-650 mg total) by mouth every 4 (four) hours as needed for mild pain. (Patient not taking: Reported on 02/19/2018)   Not Taking at Unknown time  . albuterol (PROVENTIL HFA;VENTOLIN HFA) 108 (90 BASE) MCG/ACT inhaler Inhale 2 puffs into the lungs every 4 (four) hours as needed for wheezing or shortness of breath. 1 Inhaler 0 unk at prn  . carvedilol (COREG) 3.125 MG tablet Take 1 tablet (3.125 mg total) by mouth 2 (two) times daily. Please make yearly appt with Dr.Klein  for February for future refill1st attempt 60 tablet 3 Past Month at Unknown time  . EPINEPHrine (EPIPEN 2-PAK) 0.3 mg/0.3 mL IJ SOAJ injection Inject 0.3 mLs (0.3 mg total) into the muscle once. 2 Device 0 unk at prn  . ibuprofen (ADVIL,MOTRIN) 200 MG tablet Take 200 mg by mouth every 6 (six) hours as needed for mild pain or moderate pain.   Past Week at Unknown time  . lisinopril (PRINIVIL,ZESTRIL) 5 MG tablet Take 1 tablet (5 mg total) by mouth daily. Please make yearly appt with Dr. Graciela Husbands for February for future refills. 1st attempt 30 tablet 3 Past Month at Unknown time  . Multiple Vitamins-Minerals (MULTIVITAMIN WITH MINERALS) tablet Take 1 tablet by mouth daily.   Past Week at Unknown time  . risperiDONE (RISPERDAL) 1 MG tablet Take 1 tablet (1 mg total) by mouth 2 (two) times daily for 30 days.  0   . traMADol (ULTRAM) 50 MG tablet Take 1 tablet (50 mg total) by mouth every 6 (six) hours as needed. (Patient not taking: Reported on 02/19/2018) 12 tablet 0 Not Taking at Unknown time  . [DISCONTINUED] pantoprazole (PROTONIX) 40 MG tablet Take 1 tablet (40 mg total) by mouth daily at 12 noon.       Patient Stressors: Marital or family conflict Medication change or noncompliance  Patient  Strengths: Ability for insight Average or above average intelligence Capable of independent living General fund of knowledge Motivation for treatment/growth  Treatment Modalities: Medication Management, Group therapy, Case management,  1 to 1 session with clinician, Psychoeducation, Recreational therapy.   Physician Treatment Plan for Primary Diagnosis: Delusional disorder [facility]) Long Term Goal(s): Improvement in symptoms so as ready for discharge Improvement in symptoms so as ready for discharge   Short Term Goals: Ability to demonstrate self-control will improve Ability to identify and develop effective coping behaviors will improve Ability to maintain clinical measurements within normal limits will  improve Compliance with prescribed medications will improve  Medication Management: Evaluate patient's response, side effects, and tolerance of medication regimen.  Therapeutic Interventions: 1 to 1 sessions, Unit Group sessions and Medication administration.  Evaluation of Outcomes: Adequate for Discharge  Physician Treatment Plan for Secondary Diagnosis: Principal Problem:   Delusional disorder (HCC) Active Problems:   Psychosis (HCC)  Long Term Goal(s): Improvement in symptoms so as ready for discharge Improvement in symptoms so as ready for discharge   Short Term Goals: Ability to demonstrate self-control will improve Ability to identify and develop effective coping behaviors will improve Ability to maintain clinical measurements within normal limits will improve Compliance with prescribed medications will improve     Medication Management: Evaluate patient's response, side effects, and tolerance of medication regimen.  Therapeutic Interventions: 1 to 1 sessions, Unit Group sessions and Medication administration.  Evaluation of Outcomes: Adequate for Discharge   RN Treatment Plan for Primary Diagnosis: Delusional disorder [facility]) Long Term Goal(s): Knowledge of disease and therapeutic regimen to maintain health will improve  Short Term Goals: Ability to identify and develop effective coping behaviors will improve and Compliance with prescribed medications will improve  Medication Management: RN will administer medications as ordered by provider, will assess and evaluate patient's response and provide education to patient for prescribed medication. RN will report any adverse and/or side effects to prescribing provider.  Therapeutic Interventions: 1 on 1 counseling sessions, Psychoeducation, Medication administration, Evaluate responses to treatment, Monitor vital signs and CBGs as ordered, Perform/monitor CIWA, COWS, AIMS and Fall Risk screenings as ordered, Perform wound care  treatments as ordered.  Evaluation of Outcomes: Adequate for Discharge   LCSW Treatment Plan for Primary Diagnosis: Delusional disorder [facility]) Long Term Goal(s): Safe transition to appropriate next level of care at discharge, Engage patient in therapeutic group addressing interpersonal concerns.  Short Term Goals: Engage patient in aftercare planning with referrals and resources, Increase social support and Increase skills for wellness and recovery  Therapeutic Interventions: Assess for all discharge needs, 1 to 1 time with Social worker, Explore available resources and support systems, Assess for adequacy in community support network, Educate family and significant other(s) on suicide prevention, Complete Psychosocial Assessment, Interpersonal group therapy.  Evaluation of Outcomes: Adequate for Discharge   Progress in Treatment: Attending groups: Yes. Participating in groups: Yes. Taking medication as prescribed: Yes. Toleration medication: Yes. Family/Significant other contact made: Yes, individual(s) contacted:  brother Patient understands diagnosis: Yes. Discussing patient identified problems/goals with staff: Yes. Medical problems stabilized or resolved: Yes. Denies suicidal/homicidal ideation: Yes. Issues/concerns per patient self-inventory: No. Other: none  New problem(s) identified: No, Describe:  none  New Short Term/Long Term Goal(s):  Patient Goals:  "finish detox"  Discharge Plan or Barriers:   Reason for Continuation of Hospitalization: Other; describe none  Estimated Length of Stay:discharge today  Attendees: Patient:Mario Wyatt 02/26/2018   Physician: Dr. Jeannine Kitten, MD 02/26/2018   Nursing: Norma Fredrickson,  RN 02/26/2018   RN Care Manager: 02/26/2018   Social Worker: Daleen Squibb, LCSW 02/26/2018   Recreational Therapist:  02/26/2018   Other:  02/26/2018   Other:  02/26/2018  Other: 02/26/2018        Scribe for Treatment Team: Lorri Frederick,  LCSW 02/26/2018 11:46 AM

## 2018-02-26 NOTE — Plan of Care (Signed)
Pt participated in one recreation therapy group session.   Mario Wyatt, LRT/CTRS 

## 2018-02-26 NOTE — Progress Notes (Signed)
Recreation Therapy Notes  INPATIENT RECREATION TR PLAN  Patient Details Name: Mario Wyatt MRN: [id number] DOB: 04-Aug-1990 Today's Date: 02/26/2018  Rec Therapy Plan Is patient appropriate for Therapeutic Recreation?: Yes Treatment times per week: about 3 days Estimated Length of Stay: 5-7 days TR Treatment/Interventions: Group participation (Comment)  Discharge Criteria Pt will be discharged from therapy if:: Discharged Treatment plan/goals/alternatives discussed and agreed upon by:: Patient/family  Discharge Summary Short term goals set: See patient care plan Short term goals met: Not met Progress toward goals comments: Groups attended Which groups?: Wellness, Other (Comment)([facility]) Reason goals not met: Pt attended and participated in one group session. Therapeutic equipment acquired: N/A Reason patient discharged from therapy: Discharge from hospital Pt/family agrees with progress & goals achieved: Yes Date patient discharged from therapy: 02/26/18    Victorino Sparrow, LRT/CTRS  Ria Comment, Lawton Dollinger A 02/26/2018, 12:13 PM

## 2018-02-26 NOTE — Progress Notes (Signed)
Patient ID: Mario Wyatt, male   DOB: 17-Jan-1990, 28 y.o.   MRN: [id number] Patient discharged to home/self care in the presence on his own accord.  Patient denies SI, HI and AVH upon discharge.  Patient acknowledged all understanding of all discharge instructions and receipt of personal belongings.

## 2018-02-26 NOTE — BHH Suicide Risk Assessment (Signed)
[facility] Discharge Suicide Risk Assessment   Principal Problem: Psychotic symptoms Discharge Diagnoses: Active Problems:   Psychosis (HCC)   Total Time spent with patient: 45 minutes Mr. 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 He was always eager for discharge and by the date of the 26 was probably baseline and that he was alert oriented cooperative no thoughts of harming self or others no acute psychosis and no involuntary movements.  Mental Status Per Nursing Assessment::   On Admission:  NA  Demographic Factors:  Male  Loss Factors: Decrease in vocational status  Historical Factors: NA  Risk Reduction Factors:   Religious beliefs about death  Continued Clinical Symptoms:  Previous Psychiatric Diagnoses and Treatments  Cognitive Features That Contribute To Risk:  None    Suicide Risk:  Minimal: No identifiable suicidal ideation.  Patients presenting with no risk factors but with morbid ruminations; may be classified as minimal risk based on the severity of the depressive symptoms  Follow-up Information    [facility] Follow up on 03/10/2018.   Why:  Hospital follow up appointment is Monday, 3/9 at 8:00a.  Please bring your photo ID, proof of insurance, SSN, current medications, and discharge paperwork from this hospitalization.  Contact information: [address] [place] [address] [phone]           Plan Of Care/Follow-up recommendations:  Activity:  full  Asheley Hellberg, MD 02/26/2018, 8:14 AM
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
## 2018-02-26 NOTE — Discharge Summary (Signed)
Physician Discharge Summary Note  Patient:  Mario Wyatt is an 28 y.o., male  MRN:  [id number]  DOB:  [birth year]  Patient phone:  [phone] (home)   Patient address:   [address] [place] [address],   Total Time spent with patient: Greater than 30 minutes  Date of Admission:  02/24/2018  Date of Discharge: 02-26-18  Reason for Admission: Worsening psychosis.  Principal Problem: Delusional disorder [facility])  Discharge Diagnoses: Principal Problem:   Delusional disorder (HCC) Active Problems:   Psychosis (HCC)  Past Psychiatric History:  Past Medical History:  Past Medical History:  Diagnosis Date  . Anxiety   . Arthritis    "wrists, knees; probably all over my body" (04/05/2017)  . Asthma   . Attention deficit hyperactivity disorder (ADHD)    pt denies this hx on 04/05/2017  . Cardiomyopathy (HCC)   . Chronic back pain    "right neck to lower back; shoulders" (04/05/2017)  . Congenital complete AV block   . Daily headache   . GERD (gastroesophageal reflux disease)   . Macular degeneration    bilateral  . Migraine    "a couple/month" (04/05/2017)  . Pacemaker    Placed when pt was 28 years old.  . S/P cardiac pacemaker procedure, 09/08/14, [employer] 09/09/2014  . Wears glasses     Past Surgical History:  Procedure Laterality Date  . CARDIAC PACEMAKER PLACEMENT  2007   second degree heart block  . EP IMPLANTABLE DEVICE N/A 09/08/2014   Procedure: Pacemaker Implant;  Surgeon: Mario Salvia, MD;  Location: [facility] INVASIVE CV LAB;  Service: Cardiovascular;  Laterality: N/A;  . ESOPHAGOGASTRODUODENOSCOPY N/A 02/18/2018   Procedure: ESOPHAGOGASTRODUODENOSCOPY (EGD);  Surgeon: Mario Dare, MD;  Location: [facility] ENDOSCOPY;  Service: Endoscopy;  Laterality: N/A;  . FOREIGN BODY REMOVAL N/A 02/18/2018   Procedure: FOREIGN BODY REMOVAL;  Surgeon: Mario Dare, MD;  Location: [facility] ENDOSCOPY;  Service: Endoscopy;  Laterality: N/A;  . HERNIA REPAIR    .  PACEMAKER INSERTION  1999  . PACEMAKER LEAD REMOVAL N/A 04/04/2017   Procedure: LEAD EXTRACTION AND NEW LEAD PLACEMENT AND CHANGE OUT OF OLD PACEMAKER;  Surgeon: Mario Maw, MD;  Location: [facility] OR;  Service: Cardiovascular;  Laterality: N/A;  . PERMANENT PACEMAKER GENERATOR CHANGE  04/04/2017   LEAD EXTRACTION AND NEW LEAD PLACEMENT AND CHANGE OUT OF OLD PACEMAKER  . UMBILICAL HERNIA REPAIR     "when I was a child"   Family History:  Family History  Problem Relation Age of Onset  . Hypertension Mother   . Diabetes Mother   . Bipolar disorder Mother   . Drug abuse Mother   . Glaucoma Father   . Anesthesia problems Neg Hx   . Hypotension Neg Hx   . Malignant hyperthermia Neg Hx   . Pseudochol deficiency Neg Hx    Family Psychiatric  History: See H&P Social History:  Social History   Substance and Sexual Activity  Alcohol Use Not Currently     Social History   Substance and Sexual Activity  Drug Use No    Social History   Socioeconomic History  . Marital status: Single    Spouse name: Not on file  . Number of children: Not on file  . Years of education: Not on file  . Highest education level: Not on file  Occupational History  . Not on file  Social Needs  . Financial resource strain: Not on file  .  Food insecurity:    Worry: Not on file    Inability: Not on file  . Transportation needs:    Medical: Not on file    Non-medical: Not on file  Tobacco Use  . Smoking status: Current Some Day Smoker    Packs/day: 0.50    Years: 10.00    Pack years: 5.00    Types: Cigarettes    Last attempt to quit: 10/01/2013    Years since quitting: 4.4  . Smokeless tobacco: Never Used  Substance and Sexual Activity  . Alcohol use: Not Currently  . Drug use: No  . Sexual activity: Yes  Lifestyle  . Physical activity:    Days per week: Not on file    Minutes per session: Not on file  . Stress: Not on file  Relationships  . Social connections:    Talks on phone: Not on file     Gets together: Not on file    Attends religious service: Not on file    Active member of club or organization: Not on file    Attends meetings of clubs or organizations: Not on file    Relationship status: Not on file  Other Topics Concern  . Not on file  Social History Narrative  . Not on file   Hospital Course: (Per Md's discharge admission SRA): Mario Kitchen Wyatt is 28 years of age and he does indeed have a past psychiatric history dating back to 2013 when he presented with psychosis, he believes was drug-induced.  States he was homeless at the time and people "messed with his medications" resulting in a psychotic episode and a prescription for Risperdal which he did not take after his hospitalization.  He has been generally asymptomatic as far as any psychiatric symptomatology since that time although he has a history of cannabis dependency but states he is not used in months. Obviously and more recently he presented with psychosis and agitation requiring sedation even intubation of the emergency department due to combativeness and did not even respond to ketamine he had been behaving bizarrely for 2 weeks so this is also consistent with his past history of having hyperreligiosity.  He was even banging his head against the wall when police arrived and sustained disc bulging C5/6. X-rays of abdomen showed he ingested multiple foreign bodies to keys in brush a thumb Dryphen and ampulla all found in the stomach and retrieved by upper endoscopy.  His drug screen showed benzodiazepines he states he did not take anything like that but thinks his girlfriend may have given him something.  Describes hyper religious paranoid and delusional. The patient himself blames this whole episode on the use of opiates after wisdom teeth removal.  States he has a history of reacting badly to medications.  Again it is unclear if this is all drug-induced because there is report of bizarre behaviors prior to that for several weeks  any rate he is now alert oriented cooperative without thoughts of harming self or others and contracting fully he is very coherent rational the present time. No involuntary movements. As far as diagnostic clarity I think he probably does have some bipolar type symptomatology that may be induced by substance use but even prescriptions that are legitimate at any rate I think is too risky to stop his Risperdal and probably needs a dose escalation.  After the above admission assessment, Pleas was recommended for mood stabilization treatments. The medication regimen for his presenting symptoms were discussed by the treatment team &  initiated. He was medicated, stabilized & discharged on the medications as listed on the discharge medication lists below. He was also enrolled & participated in the group counseling sessions being offered & held on this unit. He learned coping skills that should help him after discharge to cope better.  As Zarion's treatment progressed, daily assessment notes marked improvement in his symptoms. He reports feeling good. He is optimistic about the future as he plans to be adherent to his treatment regimen this time around. There no residual psychotic features present. No craving for drugs. No anxiety issues.The features of the depression/psychosis has markedly improved since starting medications. No thoughts of violence. No access to weapons. No new stressors.    Seger's case was presented during treatment team meeting this morning. The nursing staff reports that patient has been appropriate on the unit. Patient has been interacting well with peers. No behavioral issues. Patient has not voiced any suicidal thoughts. Patient has not been observed to be internally stimulated or preoccupied. Patient has been adherent with treatment recommendations. Patient has been tolerating their medication well.   During care review & discussion of his progress this morning at the  treatment team meeting. Team members feels that patient is back to his baseline level of function. Team agrees with plan to discharge patient today to continue mental health care & medication management on an outpatient basis as noted below.  He is provided with all the necessary information needed to make this appointment without any problems. He was able to engage in safety planning including plan to return to [facility] or contact emergency services if he feels unable to maintain hisown safety or the safety of others. Pt had no further questions, comments or concerns. He left [facility] with all personal belongings in no apparent distress.Transportation per the city bus. [facility] assisted with bus pass.  Physical Findings: AIMS: Facial and Oral Movements Muscles of Facial Expression: None, normal Lips and Perioral Area: None, normal Jaw: None, normal Tongue: None, normal,Extremity Movements Upper (arms, wrists, hands, fingers): None, normal Lower (legs, knees, ankles, toes): None, normal, Trunk Movements Neck, shoulders, hips: None, normal, Overall Severity Severity of abnormal movements (highest score from questions above): None, normal Incapacitation due to abnormal movements: None, normal Patient's awareness of abnormal movements (rate only patient's report): No Awareness, Dental Status Current problems with teeth and/or dentures?: No Does patient usually wear dentures?: No  CIWA:    COWS:     Musculoskeletal: Strength & Muscle Tone: within normal limits Gait & Station: normal Patient leans: N/A  Psychiatric Specialty Exam: Physical Exam  Nursing note and vitals reviewed. Constitutional: He is oriented to person, place, and time. He appears well-developed.  HENT:  Head: Normocephalic.  Eyes: Pupils are equal, round, and reactive to light.  Neck: Normal range of motion.  Cardiovascular: Normal rate.  Respiratory: Effort normal.  GI: Soft.  Genitourinary:    Genitourinary Comments: Deferred    Musculoskeletal: Normal range of motion.  Neurological: He is alert and oriented to person, place, and time.  Skin: Skin is warm and dry.    Review of Systems  Constitutional: Negative.   HENT: Negative.   Eyes: Negative.   Respiratory: Negative for cough and shortness of breath.   Cardiovascular: Negative.  Negative for chest pain and palpitations.       Hx. Cardiac issues (HTN)  Gastrointestinal: Negative.  Negative for abdominal pain, heartburn, nausea and vomiting.  Genitourinary: Negative.   Musculoskeletal: Negative.   Skin: Negative.   Neurological:  Negative.  Negative for dizziness and headaches.  Endo/Heme/Allergies: Negative.   Psychiatric/Behavioral: Positive for depression (Stabilized with medication prior to discharge), hallucinations (Hx. Psychosis (Stabilized with medication prior to discharge)) and substance abuse (Hx. Benzodiazepine use). Negative for memory loss and suicidal ideas. The patient has insomnia (Stabilized with medication prior to discharge). The patient is not nervous/anxious (Stable).     Blood pressure 106/78, pulse 77, temperature 97.8 F (36.6 C), temperature source Oral, resp. rate 18, height  (1.6 m), weight 52.2 kg.Body mass index is 20.37 kg/m.  See Md's discharge SRA   Have you used any form of tobacco in the last 30 days? (Cigarettes, Smokeless Tobacco, Cigars, and/or Pipes): Yes  Has this patient used any form of tobacco in the last 30 days? (Cigarettes, Smokeless Tobacco, Cigars, and/or Pipes): N/A  Blood Alcohol level:  Lab Results  Component Value Date   [facility] <11 03/10/2011   Metabolic Disorder Labs:  Lab Results  Component Value Date   HGBA1C 5.5 02/25/2018   MPG 111.15 02/25/2018   Lab Results  Component Value Date   PROLACTIN 20.8 (H) 02/25/2018   Lab Results  Component Value Date   TRIG 100 02/18/2018   See Psychiatric Specialty Exam and Suicide Risk Assessment completed by Attending Physician prior to  discharge.  Discharge destination:  Home  Is patient on multiple antipsychotic therapies at discharge:  No   Has Patient had three or more failed trials of antipsychotic monotherapy by history:  No  Recommended Plan for Multiple Antipsychotic Therapies: NA  Allergies as of 02/26/2018      Reactions   Iodine Anaphylaxis   Shellfish Allergy Anaphylaxis   Latex Hives, Rash      Medication List    STOP taking these medications   acetaminophen 325 MG tablet Commonly known as:  TYLENOL   albuterol 108 (90 Base) MCG/ACT inhaler Commonly known as:  PROVENTIL HFA;VENTOLIN HFA   carvedilol 3.125 MG tablet Commonly known as:  COREG   ibuprofen 200 MG tablet Commonly known as:  ADVIL,MOTRIN   multivitamin with minerals tablet   traMADol 50 MG tablet Commonly known as:  ULTRAM     TAKE these medications     Indication  EPINEPHrine 0.3 mg/0.3 mL Soaj injection Commonly known as:  EPIPEN 2-PAK Inject 0.3 mLs (0.3 mg total) into the muscle once as needed (anaphylaxis). What changed:    when to take this  reasons to take this  Indication:  Life-Threatening Hypersensitivity Reaction   gabapentin 100 MG capsule Commonly known as:  NEURONTIN Take 1 capsule (100 mg total) by mouth 3 (three) times daily. For agitation  Indication:  Agitation   hydrOXYzine 25 MG tablet Commonly known as:  ATARAX/VISTARIL Take 1 tablet (25 mg total) by mouth 3 (three) times daily as needed for anxiety.  Indication:  Feeling Anxious   lisinopril 5 MG tablet Commonly known as:  PRINIVIL,ZESTRIL Take 1 tablet (5 mg total) by mouth daily. For high blood pressure Start taking on:  February 27, 2018 What changed:  additional instructions  Indication:  High Blood Pressure Disorder   pantoprazole 40 MG tablet Commonly known as:  PROTONIX Take 1 tablet (40 mg total) by mouth daily at 12 noon. For acid reflux What changed:  additional instructions  Indication:  Gastroesophageal Reflux Disease    risperiDONE 3 MG tablet Commonly known as:  RISPERDAL Take 1 tablet (3 mg total) by mouth at bedtime. For mood control What changed:    medication strength  how much to take  when to take this  additional instructions  Indication:  Mood control   traZODone 50 MG tablet Commonly known as:  DESYREL Take 1 tablet (50 mg total) by mouth at bedtime as needed for sleep.  Indication:  Trouble Sleeping      Follow-up Information    [facility] Follow up on 03/05/2018.   Why:  Hospital follow up appointment is Wednesday, 3/4 at 8:00a.  Please bring your photo ID, proof of insurance, SSN, current medications, and discharge paperwork from this hospitalization.  Contact information: [address] [place] [address] [phone]          Follow-up recommendations: Activity:  As tolerated Diet: As recommended by your primary care doctor. Keep all scheduled follow-up appointments as recommended.   Comments: Patient is instructed prior to discharge to: Take all medications as prescribed by his/her mental healthcare provider. Report any adverse effects and or reactions from the medicines to his/her outpatient provider promptly. Patient has been instructed & cautioned: To not engage in alcohol and or illegal drug use while on prescription medicines. In the event of worsening symptoms, patient is instructed to call the crisis hotline, 911 and or go to the nearest ED for appropriate evaluation and treatment of symptoms. To follow-up with his/her primary care provider for your other medical issues, concerns and or health care needs.   Signed: Armandina Stammer, NP, pMHNP, FNP-BC 02/26/2018, 10:51 AM

## 2018-02-26 NOTE — Progress Notes (Signed)
  [facility] Adult Case Management Discharge Plan :  Will you be returning to the same living situation after discharge:  Yes,  own apartment At discharge, do you have transportation home?: No. Bus pass provided.  Do you have the ability to pay for your medications: Yes,  medicaid  Release of information consent forms completed and in the chart;  Patient's signature needed at discharge.  Patient to Follow up at: Follow-up Information    [facility] Follow up on 03/05/2018.   Why:  Hospital follow up appointment is Wednesday, 3/4 at 8:00a.  Please bring your photo ID, proof of insurance, SSN, current medications, and discharge paperwork from this hospitalization.  Contact information: [address] [place] [address] [phone]           Next level of care provider has access to [facility] Link:no  Safety Planning and Suicide Prevention discussed: Yes,  with brother  Have you used any form of tobacco in the last 30 days? (Cigarettes, Smokeless Tobacco, Cigars, and/or Pipes): Yes  Has patient been referred to the Quitline?: Patient refused referral  Patient has been referred for addiction treatment: N/A  Lorri Frederick, LCSW 02/26/2018, 9:43 AM

## 2018-03-24 ENCOUNTER — Other Ambulatory Visit (HOSPITAL_COMMUNITY): Payer: Self-pay

## 2018-03-24 ENCOUNTER — Telehealth: Payer: Self-pay | Admitting: Cardiology

## 2018-03-24 NOTE — Telephone Encounter (Signed)
   Reason for call: Reschedule echocardiogram in light of the coronavirus.  Ordering provider: Dr. Graciela Husbands  Reason for echocardiogram: Cardiomyopathy follow-up.  Past 2 echocardiograms have demonstrated EF of 30 to 35%.  Spoke to patient, he is stable with no increasing orthopnea shortness of breath fevers chest pain.  He has been having fatigue but this is been an ongoing issue.  He understands the rationale for rescheduling his echocardiogram.  He knows to contact us of any further worrisome symptoms develop.  I think would be reasonable for him to have his echocardiogram rescheduled for greater than 12 weeks unless symptoms change.  Mario Schultz, MD

## 2018-04-03 ENCOUNTER — Other Ambulatory Visit: Payer: Self-pay

## 2018-04-03 ENCOUNTER — Ambulatory Visit (INDEPENDENT_AMBULATORY_CARE_PROVIDER_SITE_OTHER): Payer: Medicaid Other | Admitting: *Deleted

## 2018-04-03 DIAGNOSIS — I442 Atrioventricular block, complete: Secondary | ICD-10-CM | POA: Diagnosis not present

## 2018-04-03 LAB — CUP PACEART REMOTE DEVICE CHECK
Battery Remaining Longevity: 95 mo
Battery Remaining Percentage: 95.5 %
Battery Voltage: 2.99 V
Brady Statistic AP VP Percent: 35 %
Brady Statistic AP VS Percent: 1 %
Brady Statistic AS VP Percent: 65 %
Brady Statistic AS VS Percent: 1 %
Brady Statistic RA Percent Paced: 34 %
Brady Statistic RV Percent Paced: 99 %
Date Time Interrogation Session: 20200402080020
Implantable Lead Implant Date: 20160907
Implantable Lead Implant Date: 20160907
Implantable Lead Implant Date: 20190404
Implantable Lead Location: 753858
Implantable Lead Location: 753859
Implantable Lead Location: 753860
Implantable Lead Model: 1948
Implantable Pulse Generator Implant Date: 20190404
Lead Channel Impedance Value: 460 Ohm
Lead Channel Impedance Value: 590 Ohm
Lead Channel Impedance Value: 860 Ohm
Lead Channel Pacing Threshold Amplitude: 0.625 V
Lead Channel Pacing Threshold Amplitude: 1 V
Lead Channel Pacing Threshold Amplitude: 1.375 V
Lead Channel Pacing Threshold Pulse Width: 0.5 ms
Lead Channel Pacing Threshold Pulse Width: 0.5 ms
Lead Channel Pacing Threshold Pulse Width: 0.5 ms
Lead Channel Sensing Intrinsic Amplitude: 1.1 mV
Lead Channel Sensing Intrinsic Amplitude: 10 mV
Lead Channel Setting Pacing Amplitude: 2 V
Lead Channel Setting Pacing Amplitude: 2 V
Lead Channel Setting Pacing Amplitude: 2.375
Lead Channel Setting Pacing Pulse Width: 0.5 ms
Lead Channel Setting Pacing Pulse Width: 0.5 ms
Lead Channel Setting Sensing Sensitivity: 2.5 mV
Pulse Gen Model: 3262
Pulse Gen Serial Number: 9003554

## 2018-04-10 ENCOUNTER — Encounter: Payer: Self-pay | Admitting: Cardiology

## 2018-04-10 NOTE — Progress Notes (Signed)
Remote pacemaker transmission.   

## 2018-05-02 ENCOUNTER — Telehealth: Payer: Self-pay | Admitting: Internal Medicine

## 2018-05-02 NOTE — Telephone Encounter (Signed)
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 Graciela Husbands did not keep the patient employed, patient applied for unemployment, he was denied, he has appealed the denial, There is no money coming in, no Job. Patient wants to know if there is something that Dr. Graciela Husbands and him self can do  to change his situation. Pls call patient to advise.

## 2018-05-08 NOTE — Telephone Encounter (Signed)
Called and got no answer.

## 2018-05-20 ENCOUNTER — Telehealth (HOSPITAL_COMMUNITY): Payer: Self-pay | Admitting: *Deleted

## 2018-05-20 NOTE — Telephone Encounter (Signed)
Left message for patient to call office about appointment. (COVID 19) 

## 2018-05-21 ENCOUNTER — Telehealth (HOSPITAL_COMMUNITY): Payer: Self-pay | Admitting: Cardiology

## 2018-05-21 NOTE — Telephone Encounter (Signed)

## 2018-05-22 ENCOUNTER — Ambulatory Visit (HOSPITAL_COMMUNITY): Payer: Medicaid Other | Attending: Internal Medicine

## 2018-05-22 ENCOUNTER — Other Ambulatory Visit: Payer: Self-pay

## 2018-05-22 DIAGNOSIS — Z95810 Presence of automatic (implantable) cardiac defibrillator: Secondary | ICD-10-CM | POA: Insufficient documentation

## 2018-05-22 DIAGNOSIS — I429 Cardiomyopathy, unspecified: Secondary | ICD-10-CM | POA: Diagnosis not present

## 2018-05-22 NOTE — Telephone Encounter (Signed)
Follow up     Pt came in today for his echo, he wanted to follow up about this message. Pt said he is unemployed and his unemployment got denied. He would like to speak to Dr. Graciela Husbands. Please call.

## 2018-06-09 ENCOUNTER — Telehealth: Payer: Self-pay | Admitting: Internal Medicine

## 2018-06-09 NOTE — Telephone Encounter (Signed)
  Patient has a hearing this afternoon with unemployment and he needs to get a letter stating that Dr Caryl Comes told him he was high risk due to his medical issues. He needs a note because he was fired from his job. His hearing is at 2:30pm today.

## 2018-06-09 NOTE — Telephone Encounter (Signed)
Spoke with pt and he is requesting a letter to the unemployment office stating he has heart conditions and this would be the reason for his missed work, which caused him to be terminated. I told him I would not be able to provide that letter for him this week as Dr Caryl Comes is out of the office. However, I would be glad to print off his letter written in Feb that was informing his employer of the need to move his shifts to a day shift. In addition, his medical conditions are also mentioned in this letter.   He states this would be okay to fax to the unemployment office. Letter faxed per his request to [facility] @ [phone].    He has no additional needs or concerns today.

## 2018-07-03 ENCOUNTER — Ambulatory Visit (INDEPENDENT_AMBULATORY_CARE_PROVIDER_SITE_OTHER): Payer: Medicaid Other | Admitting: *Deleted

## 2018-07-03 DIAGNOSIS — I442 Atrioventricular block, complete: Secondary | ICD-10-CM

## 2018-07-03 LAB — CUP PACEART REMOTE DEVICE CHECK
Battery Remaining Longevity: 96 mo
Battery Remaining Percentage: 95.5 %
Battery Voltage: 2.98 V
Brady Statistic AP VP Percent: 34 %
Brady Statistic AP VS Percent: 1 %
Brady Statistic AS VP Percent: 66 %
Brady Statistic AS VS Percent: 1 %
Brady Statistic RA Percent Paced: 34 %
Date Time Interrogation Session: 20200702090747
Implantable Lead Implant Date: 20160907
Implantable Lead Implant Date: 20160907
Implantable Lead Implant Date: 20190404
Implantable Lead Location: 753858
Implantable Lead Location: 753859
Implantable Lead Location: 753860
Implantable Lead Model: 1948
Implantable Pulse Generator Implant Date: 20190404
Lead Channel Impedance Value: 530 Ohm
Lead Channel Impedance Value: 690 Ohm
Lead Channel Impedance Value: 860 Ohm
Lead Channel Pacing Threshold Amplitude: 0.625 V
Lead Channel Pacing Threshold Amplitude: 1 V
Lead Channel Pacing Threshold Amplitude: 1.625 V
Lead Channel Pacing Threshold Pulse Width: 0.5 ms
Lead Channel Pacing Threshold Pulse Width: 0.5 ms
Lead Channel Pacing Threshold Pulse Width: 0.5 ms
Lead Channel Sensing Intrinsic Amplitude: 5 mV
Lead Channel Sensing Intrinsic Amplitude: 9.9 mV
Lead Channel Setting Pacing Amplitude: 2 V
Lead Channel Setting Pacing Amplitude: 2 V
Lead Channel Setting Pacing Amplitude: 2.625
Lead Channel Setting Pacing Pulse Width: 0.5 ms
Lead Channel Setting Pacing Pulse Width: 0.5 ms
Lead Channel Setting Sensing Sensitivity: 2.5 mV
Pulse Gen Model: 3262
Pulse Gen Serial Number: 9003554

## 2018-07-10 NOTE — Progress Notes (Signed)
Remote pacemaker transmission.   

## 2018-07-31 ENCOUNTER — Telehealth: Payer: Self-pay | Admitting: Internal Medicine

## 2018-07-31 NOTE — Telephone Encounter (Signed)
I help the pt send a manual transmission. As he was doing his transmission he states that his ppm is shocking him. I tried to explain to the pt that the ppm do not shock him but he did not take my word for it. I think when the ppm is moving he thinks it is shocking him. I told him I will have the nurse take a look at his transmission. She will give him a call back if not today it will be tomorrow. Pt states he felt his heart flutter while I was on the phone. The pt states he wants [place] to call before 9 am tomorrow because he has an appointment with his primary care done. Pt states he passed out while driving his care and that is why he is going to see his primary care Doctor.

## 2018-07-31 NOTE — Telephone Encounter (Signed)
Pt calling today to report he had some funny symptoms two days ago. He reports he was driving and felt "my pacemaker jump" and then became nauseated and dizzy. He pulled over and asked someone to call 911. It is unclear if he went to the hospital.  He was recently screened for COVID and awaiting test results from [facility]. He states they did not use his correct name when labeling his specimen. He states they used [employer]. I advised pt he should stay home until his results return and I was unsure how I could help him with the mis labeled specimen.   He would like someone to call him to help him with a manual transmission. I advised him I would forward his request to the device clinic and the nurse would review and contact him back.

## 2018-07-31 NOTE — Telephone Encounter (Signed)
New Message   Patient is calling because he was told that he does not need to leave the house without a face mask. He states that he lost his face mask and wants to know "what the deal is". I can't really figure out what exactly the patient is needing because he is unclear. But he wants to talk to someone about whether or not he should leave the house I guess. He then states that his pacemaker was jumping when he lefts his mom and they told him its either his pacemaker or his thyroid when he went to [facility]. Please call and talk with patient.

## 2018-08-01 NOTE — Telephone Encounter (Signed)
Able to reach patient, reports he had multiple calls occurring at the same time this AM and couldn't answer. Explained normal device function per his transmission. Attempted to assess if pt is experiencing PNS vs feeling PVCs, but it was very difficult to converse with patient as he repeatedly went on tangents about COVID-19. Repeatedly stated that the information he is being given is deceiving and reports he sees information on Facebook that leads him to believe "it's probably deeper." Asked him to clarify if he feels the device-related information he has been provided thus far is deceiving and he states no. Redirected conversation to cardiac and device-related questions. Able to confirm that pt denies experiencing ShOB, dizziness, chest discomfort, palpitations, or the "shocking" or "jumping" sensations at this time. Seems that he is still awaiting results from [place] test that he had done at [facility], though pt is unsure if he will be able to get results as he reports his name was recorded incorrectly. Reports he is also being worked up for a possible thyroid issue.  Pt reports his friend got him some food and he is heading to go get it. Asked if he went to his PCP this morning for his appointment and he states "oh yeah, I see her today." Advised that it is 17:02 at the time of our conversation so it is unlikely her office is still open. Encouraged him to call first thing on Monday morning to reschedule his missed(?) appointment from today. ED precautions given for any new or worsening symptoms over the weekend. Prior to disconnecting call, confirmed that pt had no further questions or concerns. Pt thanked me for the call.  Routed to Dr. Caryl Comes for review.

## 2018-08-01 NOTE — Telephone Encounter (Signed)
LMOVM requesting call back to DC. Gave direct number for return call.  Transmission from 07/31/18 at 16:40 reviewed. Normal device function. Presenting rhythm Ap/BiVp @ 88bpm with occasional PVCs. No high ventricular rates noted. No atrial arrhythmias since 07/25/18--EGM shows FFRWs and competitive atrial pacing, 2sec duration. Histograms appropriate. RA and RV lead trends stable, LV threshold somewhat variable per autocapture trend. 5 V noise reversions--most recent on 02/18/18, pt had EGD on that date.

## 2018-08-03 ENCOUNTER — Emergency Department (HOSPITAL_COMMUNITY)
Admission: EM | Admit: 2018-08-03 | Discharge: 2018-08-05 | Disposition: A | Discharge status: Home / Self Care | Payer: Medicaid Other | Attending: Emergency Medicine | Admitting: Emergency Medicine

## 2018-08-03 ENCOUNTER — Other Ambulatory Visit: Payer: Self-pay

## 2018-08-03 ENCOUNTER — Encounter (HOSPITAL_COMMUNITY): Payer: Self-pay | Admitting: Emergency Medicine

## 2018-08-03 DIAGNOSIS — J45909 Unspecified asthma, uncomplicated: Secondary | ICD-10-CM | POA: Insufficient documentation

## 2018-08-03 DIAGNOSIS — R441 Visual hallucinations: Secondary | ICD-10-CM | POA: Diagnosis not present

## 2018-08-03 DIAGNOSIS — Z20828 Contact with and (suspected) exposure to other viral communicable diseases: Secondary | ICD-10-CM | POA: Diagnosis not present

## 2018-08-03 DIAGNOSIS — Z950 Presence of cardiac pacemaker: Secondary | ICD-10-CM | POA: Diagnosis not present

## 2018-08-03 DIAGNOSIS — R440 Auditory hallucinations: Secondary | ICD-10-CM | POA: Diagnosis not present

## 2018-08-03 DIAGNOSIS — R443 Hallucinations, unspecified: Secondary | ICD-10-CM

## 2018-08-03 DIAGNOSIS — F17210 Nicotine dependence, cigarettes, uncomplicated: Secondary | ICD-10-CM | POA: Diagnosis not present

## 2018-08-03 DIAGNOSIS — F600 Paranoid personality disorder: Secondary | ICD-10-CM | POA: Diagnosis not present

## 2018-08-03 DIAGNOSIS — F209 Schizophrenia, unspecified: Secondary | ICD-10-CM | POA: Insufficient documentation

## 2018-08-03 DIAGNOSIS — Z79899 Other long term (current) drug therapy: Secondary | ICD-10-CM | POA: Diagnosis not present

## 2018-08-03 DIAGNOSIS — F29 Unspecified psychosis not due to a substance or known physiological condition: Secondary | ICD-10-CM | POA: Diagnosis present

## 2018-08-03 DIAGNOSIS — F22 Delusional disorders: Secondary | ICD-10-CM

## 2018-08-03 LAB — CBC
HCT: 45.2 % (ref 39.0–52.0)
Hemoglobin: 15.4 g/dL (ref 13.0–17.0)
MCH: 31.2 pg (ref 26.0–34.0)
MCHC: 34.1 g/dL (ref 30.0–36.0)
MCV: 91.5 fL (ref 80.0–100.0)
Platelets: 241 10*3/uL (ref 150–400)
RBC: 4.94 MIL/uL (ref 4.22–5.81)
RDW: 12.9 % (ref 11.5–15.5)
WBC: 8.4 10*3/uL (ref 4.0–10.5)
nRBC: 0 % (ref 0.0–0.2)

## 2018-08-03 LAB — COMPREHENSIVE METABOLIC PANEL
ALT: 24 U/L (ref 0–44)
AST: 29 U/L (ref 15–41)
Albumin: 4.7 g/dL (ref 3.5–5.0)
Alkaline Phosphatase: 51 U/L (ref 38–126)
Anion gap: 12 (ref 5–15)
BUN: 13 mg/dL (ref 6–20)
CO2: 23 mmol/L (ref 22–32)
Calcium: 9.7 mg/dL (ref 8.9–10.3)
Chloride: 103 mmol/L (ref 98–111)
Creatinine, Ser: 1.15 mg/dL (ref 0.61–1.24)
GFR calc Af Amer: >60 mL/min (ref >60)
GFR calc non Af Amer: >60 mL/min (ref >60)
Glucose, Bld: 116 mg/dL — ABNORMAL HIGH (ref 70–99)
Potassium: 3.5 mmol/L (ref 3.5–5.1)
Sodium: 138 mmol/L (ref 135–145)
Total Bilirubin: 1.2 mg/dL (ref 0.3–1.2)
Total Protein: 7.8 g/dL (ref 6.5–8.1)

## 2018-08-03 LAB — RAPID URINE DRUG SCREEN, HOSP PERFORMED
Amphetamines: NOT DETECTED
Barbiturates: NOT DETECTED
Benzodiazepines: NOT DETECTED
Cocaine: NOT DETECTED
Opiates: NOT DETECTED
Tetrahydrocannabinol: POSITIVE — AB

## 2018-08-03 LAB — ETHANOL: Alcohol, Ethyl (B): <10 mg/dL (ref <10)

## 2018-08-03 LAB — SARS CORONAVIRUS 2 BY RT PCR (HOSPITAL ORDER, PERFORMED IN ~~LOC~~ HOSPITAL LAB): SARS Coronavirus 2: NEGATIVE

## 2018-08-03 LAB — MAGNESIUM: Magnesium: 2.3 mg/dL (ref 1.7–2.4)

## 2018-08-03 MED ORDER — LISINOPRIL 2.5 MG PO TABS
5.0000 mg | ORAL_TABLET | Freq: Every day | ORAL | Status: DC
  Administered 2018-08-03: 5 mg via ORAL
  Filled 2018-08-03: qty 1

## 2018-08-03 MED ORDER — PANTOPRAZOLE SODIUM 40 MG PO TBEC
40.0000 mg | DELAYED_RELEASE_TABLET | Freq: Every day | ORAL | Status: DC
  Administered 2018-08-03 – 2018-08-04 (×2): 40 mg via ORAL
  Filled 2018-08-03 (×2): qty 1

## 2018-08-03 MED ORDER — RISPERIDONE 3 MG PO TABS
3.0000 mg | ORAL_TABLET | Freq: Every day | ORAL | Status: DC
  Administered 2018-08-03 – 2018-08-04 (×2): 3 mg via ORAL
  Filled 2018-08-03 (×2): qty 1

## 2018-08-03 NOTE — ED Provider Notes (Signed)
[facility] EMERGENCY DEPARTMENT Provider Note   CSN: [id number] Arrival date & time: 08/03/18  0510    History   Chief Complaint Chief Complaint  Patient presents with  . Homicidal  . Schizophrenia    HPI Mario Wyatt is a 28 y.o. male w PMHx delusional disorder, congenital complete AV block with pacemaker present, psychosis, presenting to the ED voluntarily via [facility].  [facility], he has had multiple call outs throughout the last 24 hours expressing paranoia and delusional thoughts.  He has stated that there are "dead bodies" all over his house, however he has had multiple well checks in his home today.  He states he is also concerned that there have been murdered in his home.  He is concerned about a stolen firearm from his house that occurred in the last well check, however [employer] states they confiscated this firearm and have informed him of this multiple times.  He expresses that he is concerned for the wellbeing of his brother and nephew due to his concerned that the firearm is missing (when indeed it is not).  He endorses hallucinations, stating there are multiple in the room, and he is hearing negative voices.  He has not been taking his psychiatric medications for multiple weeks because he does not like the they make him feel.  He denies SI or HI.     The history is provided by the patient.    Past Medical History:  Diagnosis Date  . Anxiety   . Arthritis    "wrists, knees; probably all over my body" (04/05/2017)  . Asthma   . Attention deficit hyperactivity disorder (ADHD)    pt denies this hx on 04/05/2017  . Cardiomyopathy ([place])   . Chronic back pain    "right neck to lower back; shoulders" (04/05/2017)  . Congenital complete AV block   . Daily headache   . GERD (gastroesophageal reflux disease)   . Macular degeneration    bilateral  . Migraine    "a couple/month" (04/05/2017)  . Pacemaker    Placed when pt was 28 years old.  . S/P cardiac pacemaker  procedure, 09/08/14, [employer] Jude medical 09/09/2014  . Wears glasses     Patient Active Problem List   Diagnosis Date Noted  . Delusional disorder ([place]) 02/26/2018  . Psychosis ([place]) 02/24/2018  . Closed left maxillary fracture ([place])   . Acute psychosis ([place]) 02/18/2018  . Acute respiratory insufficiency   . Head trauma   . Foreign body alimentary tract, subsequent encounter   . Gastric foreign body, initial encounter   . Cervical compression fracture, sequela 10/28/2017  . Chronic myofascial pain 04/17/2017  . Dilated cardiomyopathy ([place]) 04/05/2017  . Chronic systolic heart failure ([place]) 04/04/2017  . Mild intermittent asthma without complication 81/82/9937  . Asthma 07/03/2016  . Shellfish allergy 09/29/2014  . Tired 09/29/2014  . Pacemaker at end of battery life 09/09/2014  . S/P cardiac pacemaker procedure, 09/08/14, St Jude medical, gen change 09/09/2014  . Complete heart block ([place]) 09/08/2014  . Adult ADHD 05/26/2014  . Cervical pain 05/26/2014  . Chronic back pain 05/26/2014  . Neck pain 05/26/2014  . AV block, High Grade, congenital 06/11/2013  . Congenital complete atrioventricular block 06/11/2013  . Pacemaker 05/27/2013  . Vomiting 04/05/2013  . Diarrhea 04/05/2013  . Nausea 04/05/2013  . Artificial cardiac pacemaker 03/21/2011  . 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 Second degree AV block 03/21/2011  . Presence of cardiac pacemaker 03/21/2011  .  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 CARDIAC PACEMAKER PLACEMENT  2007   second degree heart block  . EP IMPLANTABLE DEVICE N/A 09/08/2014   Procedure: Pacemaker Implant;  Surgeon: Duke SalviaSteven C Klein, MD;  [facility];  Service: Cardiovascular;  Laterality: N/A;  . ESOPHAGOGASTRODUODENOSCOPY N/A 02/18/2018   Procedure: ESOPHAGOGASTRODUODENOSCOPY (EGD);  Surgeon: Meryl DareStark, Malcolm T, MD;  [facility];  Service: Endoscopy;   Laterality: N/A;  . FOREIGN BODY REMOVAL N/A 02/18/2018   Procedure: FOREIGN BODY REMOVAL;  Surgeon: Meryl DareStark, Malcolm T, MD;  [facility];  Service: Endoscopy;  Laterality: N/A;  . HERNIA REPAIR    . PACEMAKER INSERTION  1999  . PACEMAKER LEAD REMOVAL N/A 04/04/2017   Procedure: LEAD EXTRACTION AND NEW LEAD PLACEMENT AND CHANGE OUT OF OLD PACEMAKER;  Surgeon: Marinus Mawaylor, Gregg W, MD;  Location: [facility] OR;  Service: Cardiovascular;  Laterality: N/A;  . PERMANENT PACEMAKER GENERATOR CHANGE  04/04/2017   LEAD EXTRACTION AND NEW LEAD PLACEMENT AND CHANGE OUT OF OLD PACEMAKER  . UMBILICAL HERNIA REPAIR     "when I was a child"        Home Medications    Prior to Admission medications   Medication Sig Start Date End Date Taking? Authorizing Provider  EPINEPHrine (EPIPEN 2-PAK) 0.3 mg/0.3 mL IJ SOAJ injection Inject 0.3 mLs (0.3 mg total) into the muscle once as needed (anaphylaxis). 02/26/18   Armandina StammerNwoko, Agnes I, NP  gabapentin (NEURONTIN) 100 MG capsule Take 1 capsule (100 mg total) by mouth 3 (three) times daily. For agitation 02/26/18   Armandina StammerNwoko, Agnes I, NP  hydrOXYzine (ATARAX/VISTARIL) 25 MG tablet Take 1 tablet (25 mg total) by mouth 3 (three) times daily as needed for anxiety. 02/26/18   Armandina StammerNwoko, Agnes I, NP  lisinopril (PRINIVIL,ZESTRIL) 5 MG tablet Take 1 tablet (5 mg total) by mouth daily. For high blood pressure 02/27/18   Nwoko, Nicole KindredAgnes I, NP  pantoprazole (PROTONIX) 40 MG tablet Take 1 tablet (40 mg total) by mouth daily at 12 noon. For acid reflux 02/26/18   Armandina StammerNwoko, Agnes I, NP  risperiDONE (RISPERDAL) 3 MG tablet Take 1 tablet (3 mg total) by mouth at bedtime. For mood control 02/26/18   Armandina StammerNwoko, Agnes I, NP  traZODone (DESYREL) 50 MG tablet Take 1 tablet (50 mg total) by mouth at bedtime as needed for sleep. 02/26/18   Sanjuana KavaNwoko, Agnes I, NP    Family History Family History  Problem Relation Age of Onset  . Hypertension Mother   . Diabetes Mother   . Bipolar disorder Mother   . Drug abuse Mother    . Glaucoma Father   . Anesthesia problems Neg Hx   . Hypotension Neg Hx   . Malignant hyperthermia Neg Hx   . Pseudochol deficiency Neg Hx     Social History Social History   Tobacco Use  . Smoking status: Current Some Day Smoker    Packs/day: 0.50    Years: 10.00    Pack years: 5.00    Types: Cigarettes    Last attempt to quit: 10/01/2013    Years since quitting: 4.8  . Smokeless tobacco: Never Used  Substance Use Topics  . Alcohol use: Not Currently  . Drug use: No     Allergies   Iodine, Shellfish allergy, and Latex   Review of Systems Review of Systems  All other systems reviewed and are negative.    Physical Exam  Updated Vital Signs BP (!) 139/95 (BP Location: Right Arm)   Pulse 91   Temp (!) 96.7 F (35.9 C) (Oral)   Resp 19   Ht 5\' 3"  (1.6 m)   Wt 56.7 kg   SpO2 100%   BMI 22.14 kg/m   Physical Exam Vitals signs and nursing note reviewed.  Constitutional:      General: He is not in acute distress.    Appearance: He is well-developed.  HENT:     Head: Normocephalic and atraumatic.  Eyes:     Conjunctiva/sclera: Conjunctivae normal.  Cardiovascular:     Rate and Rhythm: Normal rate.  Pulmonary:     Effort: Pulmonary effort is normal.  Abdominal:     Palpations: Abdomen is soft.  Skin:    General: Skin is warm.  Neurological:     Mental Status: He is alert.  Psychiatric:        Attention and Perception: Attention normal.        Mood and Affect: Affect is flat.        Behavior: Behavior normal. Behavior is cooperative.        Thought Content: Thought content is paranoid and delusional.     Comments: Patient seems to have trouble completing his thoughts.      ED Treatments / Results  Labs (all labs ordered are listed, but only abnormal results are displayed) Labs Reviewed  COMPREHENSIVE METABOLIC PANEL - Abnormal; Notable for the following components:      Result Value   Glucose, Bld 116 (*)    All other components within normal  limits  RAPID URINE DRUG SCREEN, HOSP PERFORMED - Abnormal; Notable for the following components:   Tetrahydrocannabinol POSITIVE (*)    All other components within normal limits  ETHANOL  CBC  MAGNESIUM    EKG EKG Interpretation  Date/Time:  Sunday August 03 2018 06:49:00 EDT Ventricular Rate:  60 PR Interval:    QRS Duration: 100 QT Interval:  547 QTC Calculation: 547 R Axis:   84 Text Interpretation:  Sinus rhythm Short PR interval Repol abnrm, global ischemia, diffuse leads Prolonged QT interval When compared with ECG of 02/21/2018, QT has lengthened Sinus rhythm has replaced AV dual-paced rhythm When compared with ECG of 10/25/2009 (last ECG that was not paced), REPOLARIZATION ABNORMALITY has not changed Confirmed by Dione Booze ([id number]) on 08/03/2018 6:56:31 AM   Radiology No results found.  Procedures Procedures (including critical care time)  Medications Ordered in ED Medications - No data to display   Initial Impression / Assessment and Plan / ED Course  I have reviewed the triage vital signs and the nursing notes.  Pertinent labs & imaging results that were available during my care of the patient were reviewed by me and considered in my medical decision making (see chart for details).        Patient presenting voluntarily via [facility] with delusions and paranoia, as well as hallucinations.  He is noncompliant with his psychiatric medications.  He is currently voluntary, however IVC was initiated as patient seems to be a flight risk based on conversations with RN.  He has no medical complaints today.  Medical screening labs are unremarkable, UDS is positive for marijuana.  Patient is medically cleared for TTS evaluation and disposition.  He will likely need inpatient treatment.  Final Clinical Impressions(s) / ED Diagnoses   Final diagnoses:  Delusion (HCC)  Paranoia (HCC)  Hallucinations    ED Discharge Orders    None  Myron Lona, [place] N, PA-C 08/03/18  [id number]    Dione BoozeGlick, David, MD 08/03/18 [phone]

## 2018-08-03 NOTE — ED Notes (Addendum)
Pt states "its about to go down" while staff complete EKG. Pt staring out into space.

## 2018-08-03 NOTE — ED Notes (Signed)
Pt experiencing hallucinations at this time. Pt states there are 3 other imaginary people with time at this time. Per NT pt called NT "satan." [facility] remains at bedside. [facility] to request replacement unit

## 2018-08-03 NOTE — ED Notes (Signed)
Patient brother Christen Bedoya (twin) [phone] called asking for an update on patient.  He didn't even know patient was in ED

## 2018-08-03 NOTE — ED Triage Notes (Signed)
Pt BIB [facility] and [facility]. Pt/ called [facility] delusional stating dead bodies all over house. Pt believes a murder was occurring in his home. Pt. Was found with firearms and ammunition at home. Pt having trouble with communication. Pt frequently gets distracted when asked questions and requires redirection. Pt cooperative at this time. States he does not know why he is here.  Pt alert and oriented

## 2018-08-03 NOTE — BH Assessment (Signed)
Tele Assessment Note   Patient Name: Mario Wyatt MRN: [id number] Referring Physician:  Location of [facility]: [facility] Location of Provider: [facility]   Patent is a 28 year old male.  Patient was not orientated to time or situation.  Patient is a poor historian.  Patient had to be redirected several times throughout the assessment.  Patient seemed to be responding to internal stimuli during the assessment.   Per chart report in epic, patient informed the nurse that there are 3 other imaginary people with time at this time. Per NT pt called NT "satan."   Per chart review the patient called [facility] delusional stating dead bodies all over house. Pt believes a murder was occurring in his home.   Pt. was found with firearms and ammunition at home. Pt having trouble with communication. Pt frequently gets distracted when asked questions and requires redirection.     Diagnosis: Schizophrenia   Past Medical History:  Past Medical History:  Diagnosis Date  . Anxiety   . Arthritis    "wrists, knees; probably all over my body" (04/05/2017)  . Asthma   . Attention deficit hyperactivity disorder (ADHD)    pt denies this hx on 04/05/2017  . Cardiomyopathy ([place])   . Chronic back pain    "right neck to lower back; shoulders" (04/05/2017)  . Congenital complete AV block   . Daily headache   . GERD (gastroesophageal reflux disease)   . Macular degeneration    bilateral  . Migraine    "a couple/month" (04/05/2017)  . Pacemaker    Placed when pt was 28 years old.  . S/P cardiac pacemaker procedure, 09/08/14, St Jude medical 09/09/2014  . Wears glasses     Past Surgical History:  Procedure Laterality Date  . CARDIAC PACEMAKER PLACEMENT  2007   second degree heart block  . EP IMPLANTABLE DEVICE N/A 09/08/2014   Procedure: Pacemaker Implant;  Surgeon: Deboraha Sprang, MD;  Location: [facility] CV LAB;  Service: Cardiovascular;  Laterality: N/A;  . ESOPHAGOGASTRODUODENOSCOPY N/A 02/18/2018   Procedure:  ESOPHAGOGASTRODUODENOSCOPY (EGD);  Surgeon: Ladene Artist, MD;  Location: [facility] ENDOSCOPY;  Service: Endoscopy;  Laterality: N/A;  . FOREIGN BODY REMOVAL N/A 02/18/2018   Procedure: FOREIGN BODY REMOVAL;  Surgeon: Ladene Artist, MD;  Location: [facility] ENDOSCOPY;  Service: Endoscopy;  Laterality: N/A;  . HERNIA REPAIR    . PACEMAKER INSERTION  1999  . PACEMAKER LEAD REMOVAL N/A 04/04/2017   Procedure: LEAD EXTRACTION AND NEW LEAD PLACEMENT AND CHANGE OUT OF OLD PACEMAKER;  Surgeon: Evans Lance, MD;  Location: [facility];  Service: Cardiovascular;  Laterality: N/A;  . PERMANENT PACEMAKER GENERATOR CHANGE  04/04/2017   LEAD EXTRACTION AND NEW LEAD PLACEMENT AND CHANGE OUT OF OLD PACEMAKER  . UMBILICAL HERNIA REPAIR     "when I was a child"    Family History:  Family History  Problem Relation Age of Onset  . Hypertension Mother   . Diabetes Mother   . Bipolar disorder Mother   . Drug abuse Mother   . Glaucoma Father   . Anesthesia problems Neg Hx   . Hypotension Neg Hx   . Malignant hyperthermia Neg Hx   . Pseudochol deficiency Neg Hx     Social History:  reports that he has been smoking cigarettes. He has a 5.00 pack-year smoking history. He has never used smokeless tobacco. He reports previous alcohol use. He reports that he does not use drugs.  Additional Social History:  Alcohol / Drug  Use History of alcohol / drug use?: No history of alcohol / drug abuse  CIWA: CIWA-Ar BP: (!) 139/95 Pulse Rate: 91 COWS:    Allergies:  Allergies  Allergen Reactions  . Iodine Anaphylaxis  . Shellfish Allergy Anaphylaxis  . Latex Hives and Rash    Home Medications: (Not in a hospital admission)   OB/GYN Status:  No LMP for male patient.  General Assessment Data Location of Assessment: [facility] ED TTS Assessment: In system Is this a Tele or Face-to-Face Assessment?: Tele Assessment Is this an Initial Assessment or a Re-assessment for this encounter?: Initial Assessment Patient Accompanied  by:: N/A Language Other than English: No Living Arrangements: Other (Comment) What gender do you identify as?: Male Marital status: Single Maiden name: (Single ) Pregnancy Status: No Living Arrangements: Alone Can pt return to current living arrangement?: Yes Admission Status: Voluntary Is patient capable of signing voluntary admission?: No Referral Source: Self/Family/Friend Insurance type: (Medicaid)     Crisis Care Plan Living Arrangements: Alone  Education Status Is patient currently in school?: No Is the patient employed, unemployed or receiving disability?: Receiving disability income  Risk to self with the past 6 months Suicidal Intent: No Has patient had any suicidal intent within the past 6 months prior to admission? : No Is patient at risk for suicide?: No Suicidal Plan?: No Has patient had any suicidal plan within the past 6 months prior to admission? : No Access to Means: No What has been your use of drugs/alcohol within the last 12 months?: (na) Previous Attempts/Gestures: No How many times?: (na) Other Self Harm Risks: (na) Triggers for Past Attempts: Unpredictable Family Suicide History: Unknown Persecutory voices/beliefs?: No Depression: Yes Depression Symptoms: Insomnia, Isolating, Loss of interest in usual pleasures Substance abuse history and/or treatment for substance abuse?: No Suicide prevention information given to non-admitted patients: Not applicable  Risk to Others within the past 6 months Homicidal Ideation: No Does patient have any lifetime risk of violence toward others beyond the six months prior to admission? : Yes (comment) Thoughts of Harm to Others: Yes-Currently Present Comment - Thoughts of Harm to Others: Responding to internal stimiuli Current Homicidal Intent: No Current Homicidal Plan: No Access to Homicidal Means: No Identified Victim: (na) History of harm to others?: (unknown) Violent Behavior Description: (unknown) Does  patient have access to weapons?: Yes (Comment) Criminal Charges Pending?: No Does patient have a court date: No Is patient on probation?: Unknown  Psychosis Hallucinations: None noted, Visual Delusions: Grandiose  Mental Status Report Appearance/Hygiene: Disheveled Motor Activity: Restlessness Speech: Incoherent, Pressured, Loud, Tangential, Word salad Level of Consciousness: Alert, Irritable Mood: Anxious, Suspicious, Angry Affect: Anxious, Blunted, Depressed, Frightened Anxiety Level: Moderate Thought Processes: Tangential, Flight of Ideas, Thought Blocking Judgement: Impaired Orientation: Not oriented Obsessive Compulsive Thoughts/Behaviors: Moderate  Cognitive Functioning Concentration: Poor Memory: Recent Impaired, Remote Impaired Is patient IDD: No Impulse Control: Poor Appetite: Poor Have you had any weight changes? : No Change Total Hours of Sleep: (unknown ) Vegetative Symptoms: Unable to Assess  ADLScreening [facility] Assessment Services) Patient's cognitive ability adequate to safely complete daily activities?: No Patient able to express need for assistance with ADLs?: Yes Independently performs ADLs?: Yes (appropriate for developmental age)  Prior Inpatient Therapy Prior Inpatient Therapy: (unknown )  Prior Outpatient Therapy Prior Outpatient Therapy: (Yes, but he does ot remember the name )  ADL Screening (condition at time of admission) Patient's cognitive ability adequate to safely complete daily activities?: No Patient able to express need for assistance with ADLs?:  Yes Independently performs ADLs?: Yes (appropriate for developmental age)       Abuse/Neglect Assessment (Assessment to be complete while patient is alone) Abuse/Neglect Assessment Can Be Completed: Unable to assess, patient is non-responsive or altered mental status Values / Beliefs Cultural Requests During Hospitalization: None Consults Spiritual Care Consult Needed: No Social Work  Consult Needed: No Merchant navy officerAdvance Directives (For Healthcare) Does Patient Have a Medical Advance Directive?: No Would patient like information on creating a medical advance directive?: No - Patient declined          Disposition: Per Donell SievertSpencer Simon, PA - meets inpatient criteria   Disposition Initial Assessment Completed for this Encounter: Yes Patient referred to: Other (Comment)  This service was provided via telemedicine using a 2-way, interactive audio and video technology.  Names of all persons participating in this telemedicine service and their role in this encounter. Name: Katiejo Gilroy L. Elisabeth MostStevenson  Role: MA, LCAS-A              Phillip HealStevenson, Annalaya Wile [place] 08/03/2018 6:39 AM

## 2018-08-03 NOTE — ED Notes (Signed)
Pt waned by security. Pt's green pocket knife given to security

## 2018-08-03 NOTE — ED Notes (Signed)
[facility] remains at bedside. 

## 2018-08-03 NOTE — ED Notes (Signed)
Per staffing there are no sitters available.

## 2018-08-03 NOTE — ED Notes (Signed)
Mom- Rochel Brome [phone] if pt gets a chance to call her
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
## 2018-08-03 NOTE — ED Notes (Signed)
TTS assessment at bedside 

## 2018-08-03 NOTE — ED Notes (Signed)
Pt dressed into purple scrubs, all belongings, including pocket knife removed at this time.

## 2018-08-03 NOTE — ED Notes (Signed)
Main Lab to add Mg to CMP collected earlier

## 2018-08-03 NOTE — ED Notes (Signed)
Dr. Roxanne Mins to complete IVC paperwork

## 2018-08-03 NOTE — ED Notes (Signed)
Staffing called concerning sitter for pt d.t HI and risk for staff. Staffing informed nurse no sitter would be available till day shift, possibly till 3 pm.

## 2018-08-04 DIAGNOSIS — F600 Paranoid personality disorder: Secondary | ICD-10-CM

## 2018-08-04 DIAGNOSIS — R441 Visual hallucinations: Secondary | ICD-10-CM

## 2018-08-04 DIAGNOSIS — R440 Auditory hallucinations: Secondary | ICD-10-CM

## 2018-08-04 DIAGNOSIS — F22 Delusional disorders: Secondary | ICD-10-CM

## 2018-08-04 MED ORDER — ACETAMINOPHEN 325 MG PO TABS
650.0000 mg | ORAL_TABLET | Freq: Once | ORAL | Status: AC
  Administered 2018-08-04: 650 mg via ORAL
  Filled 2018-08-04: qty 2

## 2018-08-04 NOTE — BH Assessment (Addendum)
TTS reassessment:Patient denies SI/HI/AVH. Patient does not appear to be psychotic. Patient seen by Mordecai Maes, NP as well, who recommends patient be psych cleared.

## 2018-08-04 NOTE — ED Notes (Signed)
Pt on phone with twin brother. Pt tells this RN that his brother wants to speak to me. Brother begins asking RN questions about why pt is still in the hospital. RN explaining POC to brother of in patient placement. Brother begins yelling at [employer] on phone states he was told by a nurse this morning that pt was fine and could leave. RN explained that I am unsure why they told him that but the plan is for him to have in patient placement.

## 2018-08-04 NOTE — Discharge Instructions (Signed)
Return if you are having any problems. 

## 2018-08-04 NOTE — ED Notes (Signed)
Pt reporting dizziness to sitter, BP rechecked and slightly lower than earlier. 16oz of fluid given for pt to drink and will report to PA on rounds. Alert oriented x4.

## 2018-08-04 NOTE — Discharge Planning (Signed)
Clinical Social Work is seeking post-discharge placement for this patient at the following level of care: Inpatient Psych.

## 2018-08-04 NOTE — ED Notes (Signed)
Pt at desk made phone call to brother.brother spoke to RN, asking why he could not leave, RN informed him of IVC taken out.

## 2018-08-04 NOTE — ED Provider Notes (Signed)
TTS consultation appreciated.  Patient does not meet inpatient criteria, does not need involuntary commitment.  IVC is rescinded and is discharged and given outpatient resources for follow-up.   Delora Fuel, MD 72/82/06 [phone]
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
## 2018-08-04 NOTE — Progress Notes (Addendum)
Pt. meets criteria for inpatient treatment per XXX, NP.  No appropriate beds available at [facility]. Referred out to the following hospitals:  [facility] Details [facility] Details [facility] Details [place] Details [facility] Details [facility] Details [facility] Details [facility] Details [facility] Details [place] Details [facility] Details [facility] Details  Disposition CSW will continue to follow for placement.

## 2018-08-04 NOTE — Consult Note (Signed)
Telepsych Consultation   Reason for Consult:  Hallucinations  Referring Physician:  Delora Fuel, MD Location of Patient: [facility] Location of Provider: [place] Department  Patient Identification: Mario Wyatt MRN:  [id number] Principal Diagnosis: <principal problem not specified> Diagnosis:  Active Problems:   Psychosis ([place])   Total Time spent with patient: 20 minutes  Subjective:   Mario Wyatt is a 28 y.o. male who presented to [facility] (transported by police) with complaint of paranoia, hallucination, and delusion.  Pt is currently under IVC due to paranoia and hallucination. Patient reports "I feel fine, they told me earlier that I was supposed to be going home."  HPI:  Met with patient via telephone. Identified patient using three identifiers. Patient was examined by TTS and Mordecai Maes, NP earlier today. Per TTS note patient was psych cleared at that time. On evaluation this evening patient is alert and oriented x 4, pleasant, and cooperative. Speech is clear and coherent. Mood is euthymic.Thought process is coherent and thought content is logical. Denies suicidal ideations. Denies homicidal ideations. Denies audiovisual hallucinations. No indication that patient is responding to internal stimuli.    TTS assessment from 08/04/2018:  TTS reassessment:Patient denies SI/HI/AVH. Patient does not appear to be psychotic. Patient seen by Mordecai Maes, NP as well, who recommends patient be psych cleared.  TTS assessment from 08/03/2018:  Mario Wyatt is a 28 y.o. male who presented to [facility] (transported by police) with complaint of paranoia, hallucination, and delusion.  Pt is currently under IVC due to paranoia and hallucination.  Pt lives in [place], receives disability income, and has an outpatient psychiatrist.  Pt identified his psychiatrist as ''Lauretta Grill."  Per notes, Pt was treated for hallucination in February 2020.  Per hospital notes:   28 y.o.malew PMHx delusional disorder, congenital complete AV block with pacemaker present, psychosis, presenting to the ED voluntarily via [facility], he has had multiple call outs throughout the last 24 hours expressing paranoia and delusional thoughts. He has stated that there are "dead bodies" all over his house, however he has had multiple well checks in his home today. He states he is also concerned that there have been murdered in his home. He is concerned about a stolen firearm from his house that occurred in the last well check, however [employer] states they confiscated this firearm and have informed him of this multiple times. He expresses that he is concerned for the wellbeing of his brother and nephew due to his concerned that the firearm is missing (when indeed it is not). He endorses hallucinations, stating there are multiple in the room, and he is hearing negative voices. He has not been taking his psychiatric medications for multiple weeks because he does not like the they make him feel. He denies SI or HI.  Pt was assessed.  His face was covered with a part of a blanket.  He stared.  When asked why he was at the hospital, Pt stated, ''I want to talk to someone to see if I have coronavirus.''  When asked about suicidal ideation and hallucination, he stated only, ''I would say so.'' When asked about past suicide attempts, Pt stated, ''I would say so.''  Pt did not respond to questions about the content of his hallucination.  Pt stated that he was last treated inpatient about ten years ago ''because of my religious views.''     Risk to Self: Suicidal Ideation: Yes-Currently Present(''I would say so'') Suicidal Intent: No Is patient at risk  for suicide?: No Suicidal Plan?: No Access to Means: No What has been your use of drugs/alcohol within the last 12 months?: Amphetamines How many times?: (na) Other Self Harm Risks: (na) Triggers for Past Attempts: Unpredictable Intentional Self  Injurious Behavior: (Unknown) Risk to Others: Homicidal Ideation: No-Not Currently/Within Last 6 Months Thoughts of Harm to Others: Yes-Currently Present Comment - Thoughts of Harm to Others: Responding to internal stimuli Current Homicidal Intent: No Current Homicidal Plan: No Access to Homicidal Means: Yes Describe Access to Homicidal Means: Pt had firearms at home Identified Victim: (na) History of harm to others?: (unknown) Violent Behavior Description: (unknown) Does patient have access to weapons?: Yes (Comment) Criminal Charges Pending?: No Does patient have a court date: No Prior Inpatient Therapy: Prior Inpatient Therapy: Yes Prior Therapy Dates: 02/2018 and other Prior Therapy Facilty/Provider(s): [facility] and other Reason for Treatment: Schizophrenia Prior Outpatient Therapy: Prior Outpatient Therapy: (Unknown)  Past Medical History:  Past Medical History:  Diagnosis Date  . Anxiety   . Arthritis    "wrists, knees; probably all over my body" (04/05/2017)  . Asthma   . Attention deficit hyperactivity disorder (ADHD)    pt denies this hx on 04/05/2017  . Cardiomyopathy ([place])   . Chronic back pain    "right neck to lower back; shoulders" (04/05/2017)  . Congenital complete AV block   . Daily headache   . GERD (gastroesophageal reflux disease)   . Macular degeneration    bilateral  . Migraine    "a couple/month" (04/05/2017)  . Pacemaker    Placed when pt was 28 years old.  . S/P cardiac pacemaker procedure, 09/08/14, [employer] 09/09/2014  . Wears glasses     Past Surgical History:  Procedure Laterality Date  . CARDIAC PACEMAKER PLACEMENT  2007   second degree heart block  . EP IMPLANTABLE DEVICE N/A 09/08/2014   Procedure: Pacemaker Implant;  Surgeon: Deboraha Sprang, MD;  Location: [facility];  Service: Cardiovascular;  Laterality: N/A;  . ESOPHAGOGASTRODUODENOSCOPY N/A 02/18/2018   Procedure: ESOPHAGOGASTRODUODENOSCOPY (EGD);  Surgeon: Ladene Artist, MD;   Location: [facility] ENDOSCOPY;  Service: Endoscopy;  Laterality: N/A;  . FOREIGN BODY REMOVAL N/A 02/18/2018   Procedure: FOREIGN BODY REMOVAL;  Surgeon: Ladene Artist, MD;  Location: [facility] ENDOSCOPY;  Service: Endoscopy;  Laterality: N/A;  . HERNIA REPAIR    . PACEMAKER INSERTION  1999  . PACEMAKER LEAD REMOVAL N/A 04/04/2017   Procedure: LEAD EXTRACTION AND NEW LEAD PLACEMENT AND CHANGE OUT OF OLD PACEMAKER;  Surgeon: Evans Lance, MD;  Location: [facility];  Service: Cardiovascular;  Laterality: N/A;  . PERMANENT PACEMAKER GENERATOR CHANGE  04/04/2017   LEAD EXTRACTION AND NEW LEAD PLACEMENT AND CHANGE OUT OF OLD PACEMAKER  . UMBILICAL HERNIA REPAIR     "when I was a child"   Family History:  Family History  Problem Relation Age of Onset  . Hypertension Mother   . Diabetes Mother   . Bipolar disorder Mother   . Drug abuse Mother   . Glaucoma Father   . Anesthesia problems Neg Hx   . Hypotension Neg Hx   . Malignant hyperthermia Neg Hx   . Pseudochol deficiency Neg Hx    Family Psychiatric  History: No pertinent history Social History:  Social History   Substance and Sexual Activity  Alcohol Use Not Currently     Social History   Substance and Sexual Activity  Drug Use Yes  . Types: Amphetamines  Social History   Socioeconomic History  . Marital status: Single    Spouse name: Not on file  . Number of children: Not on file  . Years of education: Not on file  . Highest education level: Not on file  Occupational History  . Occupation: Unemployed  Social Needs  . Financial resource strain: Not on file  . Food insecurity    Worry: Not on file    Inability: Not on file  . Transportation needs    Medical: Not on file    Non-medical: Not on file  Tobacco Use  . Smoking status: Current Some Day Smoker    Packs/day: 0.50    Years: 10.00    Pack years: 5.00    Types: Cigarettes    Last attempt to quit: 10/01/2013    Years since quitting: 4.8  . Smokeless tobacco: Never  Used  Substance and Sexual Activity  . Alcohol use: Not Currently  . Drug use: Yes    Types: Amphetamines  . Sexual activity: Yes  Lifestyle  . Physical activity    Days per week: Not on file    Minutes per session: Not on file  . Stress: Not on file  Relationships  . Social Herbalist on phone: Not on file    Gets together: Not on file    Attends religious service: Not on file    Active member of club or organization: Not on file    Attends meetings of clubs or organizations: Not on file    Relationship status: Not on file  Other Topics Concern  . Not on file  Social History Narrative   Pt stated that he lives alone in [place] and that he is unemployed.     Additional Social History:    Allergies:   Allergies  Allergen Reactions  . Iodine Anaphylaxis  . Shellfish Allergy Anaphylaxis  . Latex Hives and Rash    Labs:  Results for orders placed or performed during the hospital encounter of 08/03/18 (from the past 48 hour(s))  Comprehensive metabolic panel     Status: Abnormal   Collection Time: 08/03/18  5:35 AM  Result Value Ref Range   Sodium 138 135 - 145 mmol/L   Potassium 3.5 3.5 - 5.1 mmol/L   Chloride 103 98 - 111 mmol/L   CO2 23 22 - 32 mmol/L   Glucose, Bld 116 (H) 70 - 99 mg/dL   BUN 13 6 - 20 mg/dL   Creatinine, Ser 1.15 0.61 - 1.24 mg/dL   Calcium 9.7 8.9 - 10.3 mg/dL   Total Protein 7.8 6.5 - 8.1 g/dL   Albumin 4.7 3.5 - 5.0 g/dL   AST 29 15 - 41 U/L   ALT 24 0 - 44 U/L   Alkaline Phosphatase 51 38 - 126 U/L   Total Bilirubin 1.2 0.3 - 1.2 mg/dL   GFR calc non Af Amer >60 >60 mL/min   GFR calc Af Amer >60 >60 mL/min   Anion gap 12 5 - 15    Comment: Performed at [facility], [address], [place], [place] [address]  Ethanol     Status: None   Collection Time: 08/03/18  5:35 AM  Result Value Ref Range   Alcohol, Ethyl (B) <10 <10 mg/dL    Comment: (NOTE) Lowest detectable limit for serum alcohol is 10 mg/dL. For medical  purposes only. Performed at [facility], [place] [address], [place],  [address]  cbc     Status: None   Collection Time: 08/03/18  5:35 AM  Result Value Ref Range   WBC 8.4 4.0 - 10.5 K/uL   RBC 4.94 4.22 - 5.81 MIL/uL   Hemoglobin 15.4 13.0 - 17.0 g/dL   HCT 45.2 39.0 - 52.0 %   MCV 91.5 80.0 - 100.0 fL   MCH 31.2 26.0 - 34.0 pg   MCHC 34.1 30.0 - 36.0 g/dL   RDW 12.9 11.5 - 15.5 %   Platelets 241 150 - 400 K/uL   nRBC 0.0 0.0 - 0.2 %    Comment: Performed at [facility], [place] [address] [place], [place] [address]  Magnesium     Status: None   Collection Time: 08/03/18  5:35 AM  Result Value Ref Range   Magnesium 2.3 1.7 - 2.4 mg/dL    Comment: Performed at [facility] [address] [place], [place] [address]  Rapid urine drug screen (hospital performed)     Status: Abnormal   Collection Time: 08/03/18  5:59 AM  Result Value Ref Range   Opiates NONE DETECTED NONE DETECTED   Cocaine NONE DETECTED NONE DETECTED   Benzodiazepines NONE DETECTED NONE DETECTED   Amphetamines NONE DETECTED NONE DETECTED   Tetrahydrocannabinol POSITIVE (A) NONE DETECTED   Barbiturates NONE DETECTED NONE DETECTED    Comment: (NOTE) DRUG SCREEN FOR MEDICAL PURPOSES ONLY.  IF CONFIRMATION IS NEEDED FOR ANY PURPOSE, NOTIFY LAB WITHIN 5 DAYS. LOWEST DETECTABLE LIMITS FOR URINE DRUG SCREEN Drug Class                     Cutoff (ng/mL) Amphetamine and metabolites    1000 Barbiturate and metabolites    200 Benzodiazepine                 124 Tricyclics and metabolites     300 Opiates and metabolites        300 Cocaine and metabolites        300 THC                            50 Performed at [facility], [place] [address] [place], [place] [address]   SARS Coronavirus 2 [facility] order, Performed in [facility] hospital lab) Nasopharyngeal Nasopharyngeal Swab     Status: None   Collection Time: 08/03/18  7:38 AM   Specimen: Nasopharyngeal Swab  Result Value Ref  Range   SARS Coronavirus 2 NEGATIVE NEGATIVE    Comment: (NOTE) If result is NEGATIVE SARS-CoV-2 target nucleic acids are NOT DETECTED. The SARS-CoV-2 RNA is generally detectable in upper and lower  respiratory specimens during the acute phase of infection. The lowest  concentration of SARS-CoV-2 viral copies this assay can detect is 250  copies / mL. A negative result does not preclude SARS-CoV-2 infection  and should not be used as the sole basis for treatment or other  patient management decisions.  A negative result may occur with  improper specimen collection / handling, submission of specimen other  than nasopharyngeal swab, presence of viral mutation(s) within the  areas targeted by this assay, and inadequate number of viral copies  (<250 copies / mL). A negative result must be combined with clinical  observations, patient history, and epidemiological information. If result is POSITIVE SARS-CoV-2 target nucleic acids are DETECTED. The SARS-CoV-2 RNA is generally detectable in upper and lower  respiratory specimens dur  ing the acute phase of infection.  Positive  results are indicative of active infection with SARS-CoV-2.  Clinical  correlation with patient history and other diagnostic information is  necessary to determine patient infection status.  Positive results do  not rule out bacterial infection or co-infection with other viruses. If result is PRESUMPTIVE POSTIVE SARS-CoV-2 nucleic acids MAY BE PRESENT.   A presumptive positive result was obtained on the submitted specimen  and confirmed on repeat testing.  While 2019 novel coronavirus  (SARS-CoV-2) nucleic acids may be present in the submitted sample  additional confirmatory testing may be necessary for epidemiological  and / or clinical management purposes  to differentiate between  SARS-CoV-2 and other Sarbecovirus currently known to infect humans.  If clinically indicated additional testing with an alternate test   methodology [phone]) is advised. The SARS-CoV-2 RNA is generally  detectable in upper and lower respiratory sp ecimens during the acute  phase of infection. The expected result is Negative. Fact Sheet for Patients:  StrictlyIdeas.no Fact Sheet for Healthcare Providers: BankingDealers.co.za This test is not yet approved or cleared by the [place] FDA and has been authorized for detection and/or diagnosis of SARS-CoV-2 by FDA under an Emergency Use Authorization (EUA).  This EUA will remain in effect (meaning this test can be used) for the duration of the COVID-19 declaration under Section 564(b)(1) of the Act, 21 U.S.C. section 360bbb-3(b)(1), unless the authorization is terminated or revoked sooner. Performed at [facility], [place] [address] [place], [place] [address]     Medications:  Current Facility-Administered Medications  Medication Dose Route Frequency Provider Last Rate Last Dose  . lisinopril (ZESTRIL) tablet 5 mg  5 mg Oral Daily Charlesetta Shanks, MD   Stopped at 08/04/18 0911  . pantoprazole (PROTONIX) EC tablet 40 mg  40 mg Oral Q1200 Charlesetta Shanks, MD   40 mg at 08/04/18 1149  . risperiDONE (RISPERDAL) tablet 3 mg  3 mg Oral QHS Charlesetta Shanks, MD   3 mg at 08/03/18 2326   Current Outpatient Medications  Medication Sig Dispense Refill  . carvedilol (COREG) 3.125 MG tablet Take 3.125 mg by mouth 2 (two) times a day.    . gabapentin (NEURONTIN) 300 MG capsule Take 300 mg by mouth 3 (three) times daily.    Marland Kitchen lisinopril (PRINIVIL,ZESTRIL) 5 MG tablet Take 1 tablet (5 mg total) by mouth daily. For high blood pressure 15 tablet 0  . PROAIR HFA 108 (90 Base) MCG/ACT inhaler Inhale 2 puffs into the lungs every 6 (six) hours as needed for wheezing.    Marland Kitchen EPINEPHrine (EPIPEN 2-PAK) 0.3 mg/0.3 mL IJ SOAJ injection Inject 0.3 mLs (0.3 mg total) into the muscle once as needed (anaphylaxis). (Patient not taking: Reported on  08/03/2018) 1 Device 0  . gabapentin (NEURONTIN) 100 MG capsule Take 1 capsule (100 mg total) by mouth 3 (three) times daily. For agitation (Patient not taking: Reported on 08/03/2018) 90 capsule 0  . hydrOXYzine (ATARAX/VISTARIL) 25 MG tablet Take 1 tablet (25 mg total) by mouth 3 (three) times daily as needed for anxiety. (Patient not taking: Reported on 08/03/2018) 60 tablet 0  . pantoprazole (PROTONIX) 40 MG tablet Take 1 tablet (40 mg total) by mouth daily at 12 noon. For acid reflux (Patient not taking: Reported on 08/03/2018) 15 tablet 0  . risperiDONE (RISPERDAL) 3 MG tablet Take 1 tablet (3 mg total) by mouth at bedtime. For mood control (Patient not taking: Reported on 08/03/2018) 30 tablet 0  .  traZODone (DESYREL) 50 MG tablet Take 1 tablet (50 mg total) by mouth at bedtime as needed for sleep. (Patient not taking: Reported on 08/03/2018) 30 tablet 0    Psychiatric Specialty Exam: Physical Exam  Review of Systems  Psychiatric/Behavioral: Positive for substance abuse. Negative for depression, hallucinations, memory loss and suicidal ideas. The patient is not nervous/anxious and does not have insomnia.     Blood pressure 118/71, pulse 87, temperature (!) 97.5 F (36.4 C), temperature source Oral, resp. rate 18, height _0  (1.6 m), weight 56.7 kg, SpO2 98 %.Body mass index is 22.14 kg/m.  General Appearance: UTA  Eye Contact:  UTA  Speech:  Clear and Coherent and Normal Rate  Volume:  Normal  Mood:  Euthymic  Affect:  NA  Thought Process:  Coherent, Goal Directed and Descriptions of Associations: Intact  Orientation:  Full (Time, Place, and Person)  Thought Content:  Logical and Hallucinations: None  Suicidal Thoughts:  No  Homicidal Thoughts:  No  Memory:  Immediate;   Good Recent;   Good  Judgement:  Fair  Insight:  Fair  Psychomotor Activity:  UTA  Concentration:  Concentration: Good and Attention Span: Good  Recall:  Good  Fund of Knowledge:  Good  Language:  Good  Akathisia:   Negative  Handed:  Right  AIMS (if indicated):     Assets:  Communication Skills Desire for Improvement Financial Resources/Insurance Housing Leisure Time Physical Health  ADL's:  Intact  Cognition:  WNL  Sleep:         Disposition: No evidence of imminent risk to self or others at present.   Patient does not meet criteria for psychiatric inpatient admission. Discussed crisis plan, support from social network, calling 911, coming to the Emergency Department, and calling Suicide Hotline.  Gwinda Maine, RN regarding disposition. Dr. Zenia Resides busy at this time.   This service was provided via telemedicine using a 2-way, interactive audio and video technology.  Names of all persons participating in this telemedicine service and their role in this encounter. Name: Lindon Romp Role: NP  Name: Ames Dura Role: Patient  Name:  Role:   Name:  Role:     Rozetta Nunnery, NP 08/04/2018 10:03 PM

## 2018-08-04 NOTE — ED Notes (Signed)
Rn spoke with [facility] in regards to note filled at 1358 that states pt was able to be psych cleared. [facility] stated they would be calling me back after speaking with social worker.

## 2018-08-05 NOTE — ED Notes (Signed)
Discharge instructions discussed with pt. Pt verbalized understanding. Pt stable and ambulatory. No signature pad available. 

## 2018-08-06 NOTE — Telephone Encounter (Signed)
Noted  Emily Thanks SK   

## 2018-10-02 ENCOUNTER — Ambulatory Visit (INDEPENDENT_AMBULATORY_CARE_PROVIDER_SITE_OTHER): Payer: Medicaid Other | Admitting: *Deleted

## 2018-10-02 DIAGNOSIS — I442 Atrioventricular block, complete: Secondary | ICD-10-CM | POA: Diagnosis not present

## 2018-10-02 LAB — CUP PACEART REMOTE DEVICE CHECK
Battery Remaining Longevity: 81 mo
Battery Remaining Percentage: 95.5 %
Battery Voltage: 2.98 V
Brady Statistic AP VP Percent: 32 %
Brady Statistic AP VS Percent: 1 %
Brady Statistic AS VP Percent: 67 %
Brady Statistic AS VS Percent: 1 %
Brady Statistic RA Percent Paced: 32 %
Brady Statistic RV Percent Paced: 99 %
Date Time Interrogation Session: 20201001091952
Implantable Lead Implant Date: 20160907
Implantable Lead Implant Date: 20160907
Implantable Lead Implant Date: 20190404
Implantable Lead Location: 753858
Implantable Lead Location: 753859
Implantable Lead Location: 753860
Implantable Lead Model: 1948
Implantable Pulse Generator Implant Date: 20190404
Lead Channel Impedance Value: 460 Ohm
Lead Channel Impedance Value: 640 Ohm
Lead Channel Impedance Value: 860 Ohm
Lead Channel Pacing Threshold Amplitude: 0.75 V
Lead Channel Pacing Threshold Amplitude: 0.875 V
Lead Channel Pacing Threshold Amplitude: 2.625 V
Lead Channel Pacing Threshold Pulse Width: 0.5 ms
Lead Channel Pacing Threshold Pulse Width: 0.5 ms
Lead Channel Pacing Threshold Pulse Width: 0.5 ms
Lead Channel Sensing Intrinsic Amplitude: 4.4 mV
Lead Channel Sensing Intrinsic Amplitude: 8.4 mV
Lead Channel Setting Pacing Amplitude: 1.875
Lead Channel Setting Pacing Amplitude: 2 V
Lead Channel Setting Pacing Amplitude: 3.625
Lead Channel Setting Pacing Pulse Width: 0.5 ms
Lead Channel Setting Pacing Pulse Width: 0.5 ms
Lead Channel Setting Sensing Sensitivity: 2.5 mV
Pulse Gen Model: 3262
Pulse Gen Serial Number: 9003554

## 2018-10-08 ENCOUNTER — Encounter: Payer: Self-pay | Admitting: Cardiology

## 2018-10-08 NOTE — Progress Notes (Signed)
Remote pacemaker transmission.   

## 2018-11-13 ENCOUNTER — Telehealth: Payer: Self-pay | Admitting: Internal Medicine

## 2018-11-13 NOTE — Telephone Encounter (Signed)
LMTCB

## 2018-11-13 NOTE — Telephone Encounter (Signed)
Patient is calling to speak with the nurse regarding status of being high risk for Covid. He was denied unemployment and would like to see if he could get help with getting it due to his risk factors.

## 2018-11-13 NOTE — Telephone Encounter (Addendum)
Pt called to report that he has not worked since March 2020 due to [employer]  and he has been denied Pandemic Unemployment assistance...   He is now making an appeal and does not feel comfortable going back to work.  He is asking Dr. Caryl Comes to send in a letter to the Division of Unemployment Security stating that with his current cardiac medical problems and with the pandemic still going on and until there is a vaccine he should not work and that he is more susceptible to the virus.   I explained to him that he may not be able to get the information he needs from [place] for his unemployment but will forward to Dr. Caryl Comes for his review.   Pt also states he has asthma, so I advised him to call his PCP about possibly helping him what he is asking for.   If Dr. Caryl Comes does a letter for him the fax to send it is [phone]... but to plz call the pt so he can pick up a copy also.   Pt was employed at [employer] and has considered working for [employer] but he is very concerned with COVID and does not want to return to any job.   Pt advised Dr. Caryl Comes is out of the office this week.

## 2018-11-14 NOTE — Telephone Encounter (Signed)
To whom it may concern:  We have cared for Mr Mario Wyatt since 2015.  He has a life long cardiac condition for which he has required a pacemaker since the age of 32.  He has moderate heart muscle weakness, called cardiomyopathy.  We know that people with underlying conditions of heart disease may be at higher risk from infection with COVID.    If you have any further questions, please do not hesitate to contact me.  Virl Axe

## 2018-11-14 NOTE — Telephone Encounter (Signed)
Patient said he got a call from our office, and it may have been Dr. Caryl Comes. He missed the call and would like the office to call him back.

## 2018-11-20 ENCOUNTER — Telehealth: Payer: Self-pay | Admitting: Internal Medicine

## 2018-11-20 NOTE — Telephone Encounter (Signed)
Lm to call back ./cy 

## 2018-11-20 NOTE — Telephone Encounter (Signed)
Patient is returning [employer].

## 2018-12-01 ENCOUNTER — Encounter: Payer: Medicaid Other | Admitting: Internal Medicine

## 2018-12-01 NOTE — Telephone Encounter (Signed)
Follow Up      Pt is returning a call from 11-20-18.

## 2018-12-01 NOTE — Telephone Encounter (Signed)
I spoke to the patient and informed him to reach out to Dr Caryl Comes today, 11/30 at his appointment concerning his work restrictions.

## 2018-12-02 ENCOUNTER — Telehealth: Payer: Self-pay | Admitting: Internal Medicine

## 2018-12-02 NOTE — Telephone Encounter (Signed)
I spoke to the patient who was requesting a work note from Dr Caryl Comes from 11/30 OV, but the patient was a cancellation due to oversleeping.  I told him that unfortunately we could not supply that since Dr Caryl Comes did not see him.  He verbalized understanding.

## 2018-12-02 NOTE — Telephone Encounter (Signed)
New message   Patient states that he needs a note for work for 12/01/2018. Please call to discuss.

## 2018-12-04 ENCOUNTER — Other Ambulatory Visit: Payer: Self-pay

## 2018-12-04 ENCOUNTER — Ambulatory Visit (INDEPENDENT_AMBULATORY_CARE_PROVIDER_SITE_OTHER): Payer: Medicaid Other | Admitting: Physician Assistant

## 2018-12-04 VITALS — BP 116/76 | HR 86 | Ht 63.0 in | Wt 132.0 lb

## 2018-12-04 DIAGNOSIS — I428 Other cardiomyopathies: Secondary | ICD-10-CM

## 2018-12-04 DIAGNOSIS — Z950 Presence of cardiac pacemaker: Secondary | ICD-10-CM
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
## 2018-12-04 NOTE — Patient Instructions (Signed)
Medication Instructions:  Your physician recommends that you continue on your current medications as directed. Please refer to the Current Medication list given to you today.  *If you need a refill on your cardiac medications before your next appointment, please call your pharmacy*  Lab Work: NONE ORDERED  TODAY   If you have labs (blood work) drawn today and your tests are completely normal, you will receive your results only by: . MyChart Message (if you have MyChart) OR . A paper copy in the mail If you have any lab test that is abnormal or we need to change your treatment, we will call you to review the results.  Testing/Procedures: NONE ORDERED  TODAY   Follow-Up: At [facility], you and your health needs are our priority.  As part of our continuing mission to provide you with exceptional heart care, we have created designated Provider Care Teams.  These Care Teams include your primary Cardiologist (physician) and Advanced Practice Providers (APPs -  Physician Assistants and Nurse Practitioners) who all work together to provide you with the care you need, when you need it.  Your next appointment:   1 year(s)  The format for your next appointment:   In Person  Provider:   You may see Dr. Klein  or one of the following Advanced Practice Providers on your designated Care Team:    Amber Seiler, NP  [name] Ursuy, PA-C  Michael "Andy" Tillery, PA-C   Other Instructions  

## 2018-12-11 ENCOUNTER — Encounter: Payer: Medicaid Other | Admitting: Student

## 2019-01-01 ENCOUNTER — Ambulatory Visit (INDEPENDENT_AMBULATORY_CARE_PROVIDER_SITE_OTHER): Payer: Medicaid Other | Admitting: *Deleted

## 2019-01-01 DIAGNOSIS — I442 Atrioventricular block, complete: Secondary | ICD-10-CM

## 2019-01-01 LAB — CUP PACEART REMOTE DEVICE CHECK
Battery Remaining Longevity: 80 mo
Battery Remaining Percentage: 95.5 %
Battery Voltage: 2.98 V
Brady Statistic AP VP Percent: 32 %
Brady Statistic AP VS Percent: 1 %
Brady Statistic AS VP Percent: 68 %
Brady Statistic AS VS Percent: 1 %
Brady Statistic RA Percent Paced: 32 %
Date Time Interrogation Session: 20201231053124
Implantable Lead Implant Date: 20160907
Implantable Lead Implant Date: 20160907
Implantable Lead Implant Date: 20190404
Implantable Lead Location: 753858
Implantable Lead Location: 753859
Implantable Lead Location: 753860
Implantable Lead Model: 1948
Implantable Pulse Generator Implant Date: 20190404
Lead Channel Impedance Value: 450 Ohm
Lead Channel Impedance Value: 560 Ohm
Lead Channel Impedance Value: 840 Ohm
Lead Channel Pacing Threshold Amplitude: 0.75 V
Lead Channel Pacing Threshold Amplitude: 1.125 V
Lead Channel Pacing Threshold Amplitude: 1.875 V
Lead Channel Pacing Threshold Pulse Width: 0.5 ms
Lead Channel Pacing Threshold Pulse Width: 0.5 ms
Lead Channel Pacing Threshold Pulse Width: 0.5 ms
Lead Channel Sensing Intrinsic Amplitude: 12 mV
Lead Channel Sensing Intrinsic Amplitude: 2.8 mV
Lead Channel Setting Pacing Amplitude: 2 V
Lead Channel Setting Pacing Amplitude: 2.125
Lead Channel Setting Pacing Amplitude: 2.875
Lead Channel Setting Pacing Pulse Width: 0.5 ms
Lead Channel Setting Pacing Pulse Width: 0.5 ms
Lead Channel Setting Sensing Sensitivity: 2.5 mV
Pulse Gen Model: 3262
Pulse Gen Serial Number: 9003554

## 2019-01-23 ENCOUNTER — Encounter: Payer: Medicaid Other | Admitting: Internal Medicine

## 2019-03-17 ENCOUNTER — Emergency Department (HOSPITAL_COMMUNITY)
Admission: EM | Admit: 2019-03-17 | Discharge: 2019-03-17 | Disposition: A | Discharge status: Home / Self Care | Payer: Medicaid Other | Attending: Emergency Medicine | Admitting: Emergency Medicine

## 2019-03-17 ENCOUNTER — Encounter (HOSPITAL_COMMUNITY): Payer: Self-pay

## 2019-03-17 ENCOUNTER — Other Ambulatory Visit: Payer: Self-pay

## 2019-03-17 DIAGNOSIS — Z79899 Other long term (current) drug therapy: Secondary | ICD-10-CM | POA: Insufficient documentation

## 2019-03-17 DIAGNOSIS — I5022 Chronic systolic (congestive) heart failure: Secondary | ICD-10-CM | POA: Diagnosis not present

## 2019-03-17 DIAGNOSIS — I110 Hypertensive heart disease with heart failure: Secondary | ICD-10-CM | POA: Insufficient documentation

## 2019-03-17 DIAGNOSIS — K0889 Other specified disorders of teeth and supporting structures: Secondary | ICD-10-CM | POA: Diagnosis present

## 2019-03-17 DIAGNOSIS — Z91040 Latex allergy status: Secondary | ICD-10-CM | POA: Diagnosis not present

## 2019-03-17 DIAGNOSIS — Z950 Presence of cardiac pacemaker: Secondary | ICD-10-CM | POA: Diagnosis not present

## 2019-03-17 DIAGNOSIS — F17210 Nicotine dependence, cigarettes, uncomplicated: Secondary | ICD-10-CM | POA: Insufficient documentation

## 2019-03-17 MED ORDER — NAPROXEN 500 MG PO TABS: 500.0000 mg | ORAL_TABLET | Freq: Two times a day (BID) | ORAL | 0 refills | Status: DC

## 2019-03-17 MED ORDER — AMOXICILLIN-POT CLAVULANATE 875-125 MG PO TABS: 1.0000 | ORAL_TABLET | Freq: Two times a day (BID) | ORAL | 0 refills | Status: DC

## 2019-03-17 NOTE — ED Triage Notes (Signed)
Pt arrives c/o dental pain x5 weeks, pain was bad enough to miss work yesterday and today. Saving up for a crown for the tooth because insurance will not cover it. Infected tooth is on the right back of the mouth. Pt has not been diagnosed with infection, went to see a dentist and was told to have it extracted or to have a crown placed.

## 2019-03-17 NOTE — Discharge Instructions (Signed)
Take the entire course of antibiotics regardless of symptom improvement to prevent worsening or recurrence of your infection. Take naproxen as needed for pain. Return to the ED if you start to have worsening pain, swelling, trouble opening or closing your mouth, neck pain or stiffness.

## 2019-03-17 NOTE — ED Provider Notes (Signed)
[facility] EMERGENCY DEPARTMENT Provider Note   CSN: [id number] Arrival date & time: 03/17/19  1007     History Chief Complaint  Patient presents with  . Dental Pain    Mario Wyatt is a 29 y.o. male with a past medical history of asthma, cardiomyopathy status post pacemaker placement, GERD presenting to the ED with a chief complaint of left upper dental pain.  Reports aching pain in his left upper tooth that is worse with eating and palpation.  He has tried over-the-counter medications with only minimal improvement in his symptoms.  He does see a dentist and was told that he may need a crown on this tooth but is unable to afford it.  He denies any neck pain or stiffness, swelling, shortness of breath, fevers, drainage or facial swelling.  HPI     Past Medical History:  Diagnosis Date  . Anxiety   . Arthritis    "wrists, knees; probably all over my body" (04/05/2017)  . Asthma   . Attention deficit hyperactivity disorder (ADHD)    pt denies this hx on 04/05/2017  . Cardiomyopathy (HCC)   . Chronic back pain    "right neck to lower back; shoulders" (04/05/2017)  . Congenital complete AV block   . Daily headache   . GERD (gastroesophageal reflux disease)   . Macular degeneration    bilateral  . Migraine    "a couple/month" (04/05/2017)  . Pacemaker    Placed when pt was 29 years old.  . S/P cardiac pacemaker procedure, 09/08/14, St Jude medical 09/09/2014  . Wears glasses     Patient Active Problem List   Diagnosis Date Noted  . Delusional disorder (HCC) 02/26/2018  . Psychosis (HCC) 02/24/2018  . Closed left maxillary fracture (HCC)   . Acute psychosis (HCC) 02/18/2018  . Acute respiratory insufficiency   . Head trauma   . Foreign body alimentary tract, subsequent encounter   . Gastric foreign body, initial encounter   . Cervical compression fracture, sequela 10/28/2017  . Chronic myofascial pain 04/17/2017  . Dilated cardiomyopathy (HCC)  04/05/2017  . Chronic systolic heart failure (HCC) 04/04/2017  . Mild intermittent asthma without complication 11/20/2016  . Asthma 07/03/2016  . Shellfish allergy 09/29/2014  . Tired 09/29/2014  . Pacemaker at end of battery life 09/09/2014  . S/P cardiac pacemaker procedure, 09/08/14, St Jude medical, gen change 09/09/2014  . Complete heart block (HCC) 09/08/2014  . Adult ADHD 05/26/2014  . Cervical pain 05/26/2014  . Chronic back pain 05/26/2014  . Neck pain 05/26/2014  . AV block, High Grade, congenital 06/11/2013  . Congenital complete atrioventricular block 06/11/2013  . Pacemaker 05/27/2013  . Vomiting 04/05/2013  . Diarrhea 04/05/2013  . Nausea 04/05/2013  . Artificial cardiac pacemaker 03/21/2011  . 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 Second degree AV block 03/21/2011  . Presence of cardiac pacemaker 03/21/2011  . 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 CARDIAC PACEMAKER PLACEMENT  2007   second degree heart block  . EP IMPLANTABLE DEVICE N/A 09/08/2014   Procedure: Pacemaker Implant;  Surgeon: Duke Salvia, MD;  Location: [facility] INVASIVE CV LAB;  Service: Cardiovascular;  Laterality: N/A;  . ESOPHAGOGASTRODUODENOSCOPY N/A 02/18/2018   Procedure: ESOPHAGOGASTRODUODENOSCOPY (EGD);  Surgeon: Meryl Dare, MD;  Location: [facility] ENDOSCOPY;  Service: Endoscopy;  Laterality: N/A;  . FOREIGN BODY REMOVAL N/A 02/18/2018   Procedure: FOREIGN BODY REMOVAL;  Surgeon: Ladene Artist, MD;  Location: [facility] ENDOSCOPY;  Service: Endoscopy;  Laterality: N/A;  . HERNIA REPAIR    . PACEMAKER INSERTION  1999  . PACEMAKER LEAD REMOVAL N/A 04/04/2017   Procedure: LEAD EXTRACTION AND NEW LEAD PLACEMENT AND CHANGE OUT OF OLD PACEMAKER;  Surgeon: Evans Lance, MD;  Location: [facility];  Service: Cardiovascular;  Laterality: N/A;  . PERMANENT PACEMAKER GENERATOR CHANGE  04/04/2017   LEAD EXTRACTION AND NEW LEAD PLACEMENT  AND CHANGE OUT OF OLD PACEMAKER  . UMBILICAL HERNIA REPAIR     "when I was a child"       Family History  Problem Relation Age of Onset  . Hypertension Mother   . Diabetes Mother   . Bipolar disorder Mother   . Drug abuse Mother   . Glaucoma Father   . Anesthesia problems Neg Hx   . Hypotension Neg Hx   . Malignant hyperthermia Neg Hx   . Pseudochol deficiency Neg Hx     Social History   Tobacco Use  . Smoking status: Current Some Day Smoker    Packs/day: 0.50    Years: 10.00    Pack years: 5.00    Types: Cigarettes    Last attempt to quit: 10/01/2013    Years since quitting: 5.4  . Smokeless tobacco: Never Used  Substance Use Topics  . Alcohol use: Not Currently  . Drug use: Yes    Types: Amphetamines    Home Medications Prior to Admission medications   Medication Sig Start Date End Date Taking? Authorizing Provider  amoxicillin-clavulanate (AUGMENTIN) 875-125 MG tablet Take 1 tablet by mouth every 12 (twelve) hours. 03/17/19   Khyan Oats, PA-C  carvedilol (COREG) 3.125 MG tablet Take 3.125 mg by mouth 2 (two) times a day. 03/06/18   [provider]  EPINEPHrine (EPIPEN 2-PAK) 0.3 mg/0.3 mL IJ SOAJ injection Inject 0.3 mLs (0.3 mg total) into the muscle once as needed (anaphylaxis). Patient not taking: Reported on 08/03/2018 02/26/18   Lindell Spar I, NP  gabapentin (NEURONTIN) 300 MG capsule Take 300 mg by mouth 3 (three) times daily. 05/14/18   [provider]  lisinopril (PRINIVIL,ZESTRIL) 5 MG tablet Take 1 tablet (5 mg total) by mouth daily. For high blood pressure 02/27/18   Nwoko, Herbert Pun I, NP  naproxen (NAPROSYN) 500 MG tablet Take 1 tablet (500 mg total) by mouth 2 (two) times daily. 03/17/19   Delia Heady, PA-C  PROAIR HFA 108 (90 Base) MCG/ACT inhaler Inhale 2 puffs into the lungs every 6 (six) hours as needed for wheezing. 07/22/18   [provider]  pantoprazole (PROTONIX) 40 MG tablet Take 1 tablet (40 mg total) by mouth daily at 12  noon. For acid reflux Patient not taking: Reported on 08/03/2018 02/26/18 08/04/18  Lindell Spar I, NP  traZODone (DESYREL) 50 MG tablet Take 1 tablet (50 mg total) by mouth at bedtime as needed for sleep. Patient not taking: Reported on 08/03/2018 02/26/18 08/04/18  Lindell Spar I, NP    Allergies    Iodine, Shellfish allergy, and Latex  Review of Systems   Review of Systems  Constitutional: Negative for chills and fever.  HENT: Positive for dental problem. Negative for facial swelling and sinus pressure.   Musculoskeletal: Negative for neck pain and neck stiffness.    Physical Exam Updated Vital Signs BP (!) 121/92 (BP Location: Left Arm)   Pulse 74   Temp 97.8 F (36.6 C) (Oral)   Resp 16   Ht  5\' 3"  (1.6 m)   Wt 56.7 kg   SpO2 98%   BMI 22.14 kg/m   Physical Exam Vitals and nursing note reviewed.  Constitutional:      General: He is not in acute distress.    Appearance: He is well-developed. He is not diaphoretic.  HENT:     Head: Normocephalic and atraumatic.     Mouth/Throat:      Comments: TTP with the indicated tooth #13.  No gross dental abscess or site of drainage. No facial, neck or cheek swelling noted. No pooling of secretions or trismus.  Normal voice noted with no difficulty swallowing or breathing.  No submandibular erythema, edema or crepitus noted. Eyes:     General: No scleral icterus.    Conjunctiva/sclera: Conjunctivae normal.  Pulmonary:     Effort: Pulmonary effort is normal. No respiratory distress.  Musculoskeletal:     Cervical back: Normal range of motion.  Skin:    Findings: No rash.  Neurological:     Mental Status: He is alert.     ED Results / Procedures / Treatments   Labs (all labs ordered are listed, but only abnormal results are displayed) Labs Reviewed - No data to display  EKG None  Radiology No results found.  Procedures Procedures (including critical care time)  Medications Ordered in ED Medications - No data to  display  ED Course  I have reviewed the triage vital signs and the nursing notes.  Pertinent labs & imaging results that were available during my care of the patient were reviewed by me and considered in my medical decision making (see chart for details).    MDM Rules/Calculators/A&P                      Patient with dentalgia. On exam, there is no evidence of a drainable abscess. No trismus, glossal elevation, unilateral tonsillar swelling. No evidence of retropharyngeal or peritonsillar abscess or Ludwig angina. Will treat with  Augmentin and naproxen. Pt instructed to follow-up with dentist as soon as possible. Resource guide provided with AVS.  Patient is hemodynamically stable, in NAD, and able to ambulate in the ED. Evaluation does not show pathology that would require ongoing emergent intervention or inpatient treatment. I have personally reviewed and interpreted all lab work and imaging at today's ED visit. I explained the diagnosis to the patient. Pain has been managed and has no complaints prior to discharge. Patient is comfortable with above plan and is stable for discharge at this time. All questions were answered prior to disposition. Strict return precautions for returning to the ED were discussed. Encouraged follow up with PCP.   An After Visit Summary was printed and given to the patient.   Portions of this note were generated with . Dictation errors may occur despite best attempts at proofreading.  Final Clinical Impression(s) / ED Diagnoses Final diagnoses:  Pain, dental    Rx / DC Orders ED Discharge Orders         Ordered    amoxicillin-clavulanate (AUGMENTIN) 875-125 MG tablet  Every 12 hours     03/17/19 1051    naproxen (NAPROSYN) 500 MG tablet  2 times daily     03/17/19 [address] PA-C 03/17/19 1052    03/19/19, MD 03/20/19 0825

## 2019-03-30 ENCOUNTER — Encounter (HOSPITAL_COMMUNITY): Payer: Self-pay | Admitting: Emergency Medicine

## 2019-03-30 ENCOUNTER — Other Ambulatory Visit: Payer: Self-pay

## 2019-03-30 ENCOUNTER — Emergency Department (HOSPITAL_COMMUNITY)
Admission: EM | Admit: 2019-03-30 | Discharge: 2019-03-30 | Disposition: A | Discharge status: Home / Self Care | Payer: Medicaid Other | Attending: Emergency Medicine | Admitting: Emergency Medicine

## 2019-03-30 DIAGNOSIS — Z5321 Procedure and treatment not carried out due to patient leaving prior to being seen by health care provider: Secondary | ICD-10-CM | POA: Diagnosis not present

## 2019-03-30 DIAGNOSIS — M545 Low back pain: Secondary | ICD-10-CM | POA: Diagnosis present

## 2019-03-30 LAB — URINALYSIS, ROUTINE W REFLEX MICROSCOPIC
Bilirubin Urine: NEGATIVE
Glucose, UA: NEGATIVE mg/dL
Ketones, ur: NEGATIVE mg/dL
Leukocytes,Ua: NEGATIVE
Nitrite: NEGATIVE
Protein, ur: 30 mg/dL — AB
RBC / HPF: >50 RBC/hpf — ABNORMAL HIGH (ref 0–5)
Specific Gravity, Urine: 1.019 (ref 1.005–1.030)
pH: 8 (ref 5.0–8.0)

## 2019-03-30 LAB — CBC WITH DIFFERENTIAL/PLATELET
Abs Immature Granulocytes: 0.01 10*3/uL (ref 0.00–0.07)
Basophils Absolute: 0.1 10*3/uL (ref 0.0–0.1)
Basophils Relative: 1 %
Eosinophils Absolute: 0.7 10*3/uL — ABNORMAL HIGH (ref 0.0–0.5)
Eosinophils Relative: 10 %
HCT: 49.6 % (ref 39.0–52.0)
Hemoglobin: 16.5 g/dL (ref 13.0–17.0)
Immature Granulocytes: 0 %
Lymphocytes Relative: 37 %
Lymphs Abs: 2.3 10*3/uL (ref 0.7–4.0)
MCH: 30.7 pg (ref 26.0–34.0)
MCHC: 33.3 g/dL (ref 30.0–36.0)
MCV: 92.4 fL (ref 80.0–100.0)
Monocytes Absolute: 0.6 10*3/uL (ref 0.1–1.0)
Monocytes Relative: 10 %
Neutro Abs: 2.6 10*3/uL (ref 1.7–7.7)
Neutrophils Relative %: 42 %
Platelets: 249 10*3/uL (ref 150–400)
RBC: 5.37 MIL/uL (ref 4.22–5.81)
RDW: 12.8 % (ref 11.5–15.5)
WBC: 6.3 10*3/uL (ref 4.0–10.5)
nRBC: 0 % (ref 0.0–0.2)

## 2019-03-30 LAB — BASIC METABOLIC PANEL
Anion gap: 8 (ref 5–15)
BUN: 16 mg/dL (ref 6–20)
CO2: 28 mmol/L (ref 22–32)
Calcium: 9.6 mg/dL (ref 8.9–10.3)
Chloride: 102 mmol/L (ref 98–111)
Creatinine, Ser: 1.27 mg/dL — ABNORMAL HIGH (ref 0.61–1.24)
GFR calc Af Amer: >60 mL/min (ref >60)
GFR calc non Af Amer: >60 mL/min (ref >60)
Glucose, Bld: 80 mg/dL (ref 70–99)
Potassium: 4.3 mmol/L (ref 3.5–5.1)
Sodium: 138 mmol/L (ref 135–145)

## 2019-03-30 NOTE — ED Triage Notes (Signed)
Patient reports chronic back pain worse today at left lower back , denies injury , no urinary discomfort or hematuria .

## 2019-04-02 ENCOUNTER — Ambulatory Visit (INDEPENDENT_AMBULATORY_CARE_PROVIDER_SITE_OTHER): Payer: Medicaid Other | Admitting: *Deleted

## 2019-04-02 DIAGNOSIS — I442 Atrioventricular block, complete: Secondary | ICD-10-CM

## 2019-04-02 LAB — CUP PACEART REMOTE DEVICE CHECK
Battery Remaining Longevity: 80 mo
Battery Remaining Percentage: 95.5 %
Battery Voltage: 2.98 V
Brady Statistic AP VP Percent: 36 %
Brady Statistic AP VS Percent: 1 %
Brady Statistic AS VP Percent: 64 %
Brady Statistic AS VS Percent: 1 %
Brady Statistic RA Percent Paced: 36 %
Date Time Interrogation Session: 20210401111421
Implantable Lead Implant Date: 20160907
Implantable Lead Implant Date: 20160907
Implantable Lead Implant Date: 20190404
Implantable Lead Location: 753858
Implantable Lead Location: 753859
Implantable Lead Location: 753860
Implantable Lead Model: 1948
Implantable Pulse Generator Implant Date: 20190404
Lead Channel Impedance Value: 450 Ohm
Lead Channel Impedance Value: 550 Ohm
Lead Channel Impedance Value: 840 Ohm
Lead Channel Pacing Threshold Amplitude: 0.625 V
Lead Channel Pacing Threshold Amplitude: 1 V
Lead Channel Pacing Threshold Amplitude: 1.625 V
Lead Channel Pacing Threshold Pulse Width: 0.5 ms
Lead Channel Pacing Threshold Pulse Width: 0.5 ms
Lead Channel Pacing Threshold Pulse Width: 0.5 ms
Lead Channel Sensing Intrinsic Amplitude: 1.9 mV
Lead Channel Sensing Intrinsic Amplitude: 12 mV
Lead Channel Setting Pacing Amplitude: 2 V
Lead Channel Setting Pacing Amplitude: 2 V
Lead Channel Setting Pacing Amplitude: 2.625
Lead Channel Setting Pacing Pulse Width: 0.5 ms
Lead Channel Setting Pacing Pulse Width: 0.5 ms
Lead Channel Setting Sensing Sensitivity: 2.5 mV
Pulse Gen Model: 3262
Pulse Gen Serial Number: 9003554

## 2019-04-02 NOTE — Progress Notes (Signed)
PPM Remote  

## 2019-04-17 ENCOUNTER — Telehealth: Payer: Self-pay | Admitting: Internal Medicine

## 2019-04-17 NOTE — Telephone Encounter (Signed)
Patient said he started a new job at [employer] and will need a letter to describe his cardiac limitations. Please let him know when the letter is ready

## 2019-04-20 NOTE — Telephone Encounter (Signed)
Spoke with pt who states he must have a letter prior to starting work with [employer] regarding any  restrictions he may have related to his cardiac history and pacemaker.  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  Pt has appointment scheduled for 05/12/2019 with Dr Graciela Husbands.  Pt advised Dr Graciela Husbands is out of the office for the next 2 weeks.  Will forward to APP for assistance.  Pt verbalizes understanding and agrees with current plan.

## 2019-04-21 NOTE — Telephone Encounter (Signed)
  [name], could you please look at this when you have a chance?   I can't see any limitations he would specifically have but haven't seen him before.  He seemed to be doing relatively well when last seen in 12/2018.   Thank you!  I'll gladly still write the letter, but just wanted to check with someone that had seen him to make sure he didn't have any specific limitations.   Thank you!  Casimiro Needle [address] Daniel, PA-C  04/21/2019 9:14 AM

## 2019-04-22 ENCOUNTER — Encounter: Payer: Self-pay | Admitting: Physician Assistant

## 2019-04-24 NOTE — Progress Notes (Signed)
Spoke with pt who asks letter be mailed to him.  Letter placed in [place] mail for pick up.

## 2019-05-10 ENCOUNTER — Encounter (HOSPITAL_COMMUNITY): Payer: Self-pay | Admitting: Emergency Medicine

## 2019-05-10 ENCOUNTER — Other Ambulatory Visit: Payer: Self-pay

## 2019-05-10 ENCOUNTER — Emergency Department (HOSPITAL_COMMUNITY)
Admission: EM | Admit: 2019-05-10 | Discharge: 2019-05-10 | Disposition: A | Discharge status: Home / Self Care | Payer: Medicaid Other | Attending: Emergency Medicine | Admitting: Emergency Medicine

## 2019-05-10 DIAGNOSIS — F17210 Nicotine dependence, cigarettes, uncomplicated: Secondary | ICD-10-CM | POA: Diagnosis not present

## 2019-05-10 DIAGNOSIS — Z79899 Other long term (current) drug therapy: Secondary | ICD-10-CM | POA: Diagnosis not present

## 2019-05-10 DIAGNOSIS — Z950 Presence of cardiac pacemaker: Secondary | ICD-10-CM | POA: Insufficient documentation

## 2019-05-10 DIAGNOSIS — Z91040 Latex allergy status: Secondary | ICD-10-CM | POA: Diagnosis not present

## 2019-05-10 DIAGNOSIS — J45909 Unspecified asthma, uncomplicated: Secondary | ICD-10-CM | POA: Insufficient documentation

## 2019-05-10 DIAGNOSIS — K047 Periapical abscess without sinus: Secondary | ICD-10-CM | POA: Insufficient documentation

## 2019-05-10 DIAGNOSIS — K0889 Other specified disorders of teeth and supporting structures: Secondary | ICD-10-CM | POA: Diagnosis present

## 2019-05-10 LAB — CBC WITH DIFFERENTIAL/PLATELET
Abs Immature Granulocytes: 0.01 10*3/uL (ref 0.00–0.07)
Basophils Absolute: 0.1 10*3/uL (ref 0.0–0.1)
Basophils Relative: 1 %
Eosinophils Absolute: 0.5 10*3/uL (ref 0.0–0.5)
Eosinophils Relative: 8 %
HCT: 45.1 % (ref 39.0–52.0)
Hemoglobin: 15 g/dL (ref 13.0–17.0)
Immature Granulocytes: 0 %
Lymphocytes Relative: 17 %
Lymphs Abs: 1.2 10*3/uL (ref 0.7–4.0)
MCH: 30.6 pg (ref 26.0–34.0)
MCHC: 33.3 g/dL (ref 30.0–36.0)
MCV: 92 fL (ref 80.0–100.0)
Monocytes Absolute: 0.7 10*3/uL (ref 0.1–1.0)
Monocytes Relative: 10 %
Neutro Abs: 4.4 10*3/uL (ref 1.7–7.7)
Neutrophils Relative %: 64 %
Platelets: 209 10*3/uL (ref 150–400)
RBC: 4.9 MIL/uL (ref 4.22–5.81)
RDW: 12.9 % (ref 11.5–15.5)
WBC: 6.9 10*3/uL (ref 4.0–10.5)
nRBC: 0 % (ref 0.0–0.2)

## 2019-05-10 LAB — COMPREHENSIVE METABOLIC PANEL
ALT: 18 U/L (ref 0–44)
AST: 22 U/L (ref 15–41)
Albumin: 4.1 g/dL (ref 3.5–5.0)
Alkaline Phosphatase: 58 U/L (ref 38–126)
Anion gap: 8 (ref 5–15)
BUN: 11 mg/dL (ref 6–20)
CO2: 25 mmol/L (ref 22–32)
Calcium: 9.2 mg/dL (ref 8.9–10.3)
Chloride: 105 mmol/L (ref 98–111)
Creatinine, Ser: 1 mg/dL (ref 0.61–1.24)
GFR calc Af Amer: >60 mL/min (ref >60)
GFR calc non Af Amer: >60 mL/min (ref >60)
Glucose, Bld: 87 mg/dL (ref 70–99)
Potassium: 4.3 mmol/L (ref 3.5–5.1)
Sodium: 138 mmol/L (ref 135–145)
Total Bilirubin: 0.7 mg/dL (ref 0.3–1.2)
Total Protein: 7 g/dL (ref 6.5–8.1)

## 2019-05-10 MED ORDER — PENICILLIN V POTASSIUM 250 MG PO TABS
500.0000 mg | ORAL_TABLET | Freq: Once | ORAL | Status: AC
  Administered 2019-05-10: 23:00:00 500 mg via ORAL
  Filled 2019-05-10: qty 2

## 2019-05-10 MED ORDER — HYDROCODONE-ACETAMINOPHEN 5-325 MG PO TABS: 1.0000 | ORAL_TABLET | ORAL | 0 refills | Status: DC | PRN

## 2019-05-10 MED ORDER — IBUPROFEN 400 MG PO TABS
600.0000 mg | ORAL_TABLET | Freq: Once | ORAL | Status: AC
  Administered 2019-05-10: 23:00:00 600 mg via ORAL
  Filled 2019-05-10: qty 1

## 2019-05-10 MED ORDER — IBUPROFEN 600 MG PO TABS: 600.0000 mg | ORAL_TABLET | Freq: Four times a day (QID) | ORAL | 0 refills | Status: DC | PRN

## 2019-05-10 MED ORDER — PENICILLIN V POTASSIUM 500 MG PO TABS: 500.0000 mg | ORAL_TABLET | Freq: Four times a day (QID) | ORAL | 0 refills | Status: AC

## 2019-05-10 MED ORDER — HYDROCODONE-ACETAMINOPHEN 5-325 MG PO TABS
1.0000 | ORAL_TABLET | Freq: Once | ORAL | Status: AC
  Administered 2019-05-10: 23:00:00 1 via ORAL
  Filled 2019-05-10: qty 1

## 2019-05-10 NOTE — ED Provider Notes (Signed)
[facility] EMERGENCY DEPARTMENT Provider Note   CSN: [id number] Arrival date & time: 05/10/19  2155     History Chief Complaint  Patient presents with  . Abscess    Mario Wyatt is a 29 y.o. male.  Patient to ED with dental pain and facial swelling x 2 days. He denies fever. He reports nausea with the worst pain. No active vomiting. Today he noticed left sided facial swelling and worsening dental pain.   The history is provided by the patient. No language interpreter was used.  Abscess Associated symptoms: nausea   Associated symptoms: no fever        Past Medical History:  Diagnosis Date  . Anxiety   . Arthritis    "wrists, knees; probably all over my body" (04/05/2017)  . Asthma   . Attention deficit hyperactivity disorder (ADHD)    pt denies this hx on 04/05/2017  . Cardiomyopathy ([place])   . Chronic back pain    "right neck to lower back; shoulders" (04/05/2017)  . Congenital complete AV block   . Daily headache   . GERD (gastroesophageal reflux disease)   . Macular degeneration    bilateral  . Migraine    "a couple/month" (04/05/2017)  . Pacemaker    Placed when pt was 29 years old.  . S/P cardiac pacemaker procedure, 09/08/14, St Jude medical 09/09/2014  . Wears glasses     Patient Active Problem List   Diagnosis Date Noted  . Delusional disorder ([place]) 02/26/2018  . Psychosis ([place]) 02/24/2018  . Closed left maxillary fracture ([place])   . Acute psychosis ([place]) 02/18/2018  . Acute respiratory insufficiency   . Head trauma   . Foreign body alimentary tract, subsequent encounter   . Gastric foreign body, initial encounter   . Cervical compression fracture, sequela 10/28/2017  . Chronic myofascial pain 04/17/2017  . Dilated cardiomyopathy ([place]) 04/05/2017  . Chronic systolic heart failure ([place]) 04/04/2017  . Mild intermittent asthma without complication 67/34/1938  . Asthma 07/03/2016  . Shellfish allergy 09/29/2014  . Tired 09/29/2014  .  Pacemaker at end of battery life 09/09/2014  . S/P cardiac pacemaker procedure, 09/08/14, St Jude medical, gen change 09/09/2014  . Complete heart block ([place]) 09/08/2014  . Adult ADHD 05/26/2014  . Cervical pain 05/26/2014  . Chronic back pain 05/26/2014  . Neck pain 05/26/2014  . AV block, High Grade, congenital 06/11/2013  . Congenital complete atrioventricular block 06/11/2013  . Pacemaker 05/27/2013  . Vomiting 04/05/2013  . Diarrhea 04/05/2013  . Nausea 04/05/2013  . Artificial cardiac pacemaker 03/21/2011  . 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 Second degree AV block 03/21/2011  . Presence of cardiac pacemaker 03/21/2011  . 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 CARDIAC PACEMAKER PLACEMENT  2007   second degree heart block  . EP IMPLANTABLE DEVICE N/A 09/08/2014   Procedure: Pacemaker Implant;  Surgeon: Deboraha Sprang, MD;  Location: [facility];  Service: Cardiovascular;  Laterality: N/A;  . ESOPHAGOGASTRODUODENOSCOPY N/A 02/18/2018   Procedure: ESOPHAGOGASTRODUODENOSCOPY (EGD);  Surgeon: Ladene Artist, MD;  Location: [facility] ENDOSCOPY;  Service: Endoscopy;  Laterality: N/A;  . FOREIGN BODY REMOVAL N/A 02/18/2018   Procedure: FOREIGN BODY REMOVAL;  Surgeon: Ladene Artist, MD;  Location: [facility] ENDOSCOPY;  Service: Endoscopy;  Laterality: N/A;  . HERNIA REPAIR    . PACEMAKER INSERTION  1999  . PACEMAKER LEAD REMOVAL N/A  04/04/2017   Procedure: LEAD EXTRACTION AND NEW LEAD PLACEMENT AND CHANGE OUT OF OLD PACEMAKER;  Surgeon: Marinus Maw, MD;  Location: [facility] OR;  Service: Cardiovascular;  Laterality: N/A;  . PERMANENT PACEMAKER GENERATOR CHANGE  04/04/2017   LEAD EXTRACTION AND NEW LEAD PLACEMENT AND CHANGE OUT OF OLD PACEMAKER  . UMBILICAL HERNIA REPAIR     "when I was a child"       Family History  Problem Relation Age of Onset  . Hypertension Mother   . Diabetes Mother   . Bipolar  disorder Mother   . Drug abuse Mother   . Glaucoma Father   . Anesthesia problems Neg Hx   . Hypotension Neg Hx   . Malignant hyperthermia Neg Hx   . Pseudochol deficiency Neg Hx     Social History   Tobacco Use  . Smoking status: Current Some Day Smoker    Packs/day: 0.50    Years: 10.00    Pack years: 5.00    Types: Cigarettes    Last attempt to quit: 10/01/2013    Years since quitting: 5.6  . Smokeless tobacco: Never Used  Substance Use Topics  . Alcohol use: Not Currently  . Drug use: Yes    Types: Amphetamines    Home Medications Prior to Admission medications   Medication Sig Start Date End Date Taking? Authorizing Provider  amoxicillin-clavulanate (AUGMENTIN) 875-125 MG tablet Take 1 tablet by mouth every 12 (twelve) hours. 03/17/19   Khatri, Hina, PA-C  carvedilol (COREG) 3.125 MG tablet Take 3.125 mg by mouth 2 (two) times a day. 03/06/18   [provider]  EPINEPHrine (EPIPEN 2-PAK) 0.3 mg/0.3 mL IJ SOAJ injection Inject 0.3 mLs (0.3 mg total) into the muscle once as needed (anaphylaxis). Patient not taking: Reported on 08/03/2018 02/26/18   Armandina Stammer I, NP  gabapentin (NEURONTIN) 300 MG capsule Take 300 mg by mouth 3 (three) times daily. 05/14/18   [provider]  lisinopril (PRINIVIL,ZESTRIL) 5 MG tablet Take 1 tablet (5 mg total) by mouth daily. For high blood pressure 02/27/18   Nwoko, Nicole Kindred I, NP  naproxen (NAPROSYN) 500 MG tablet Take 1 tablet (500 mg total) by mouth 2 (two) times daily. 03/17/19   Dietrich Pates, PA-C  PROAIR HFA 108 (90 Base) MCG/ACT inhaler Inhale 2 puffs into the lungs every 6 (six) hours as needed for wheezing. 07/22/18   [provider]  pantoprazole (PROTONIX) 40 MG tablet Take 1 tablet (40 mg total) by mouth daily at 12 noon. For acid reflux Patient not taking: Reported on 08/03/2018 02/26/18 08/04/18  Armandina Stammer I, NP  traZODone (DESYREL) 50 MG tablet Take 1 tablet (50 mg total) by mouth at bedtime as needed for  sleep. Patient not taking: Reported on 08/03/2018 02/26/18 08/04/18  Armandina Stammer I, NP    Allergies    Iodine, Shellfish allergy, and Latex  Review of Systems   Review of Systems  Constitutional: Negative for chills and fever.  HENT: Positive for dental problem and facial swelling. Negative for trouble swallowing.   Gastrointestinal: Positive for nausea.  Musculoskeletal: Negative for neck stiffness.    Physical Exam Updated Vital Signs BP 123/80 (BP Location: Right Arm)   Pulse 82   Temp 98.6 F (37 C) (Oral)   Resp 18   Ht 5\' 3"  (1.6 m)   Wt 59 kg   SpO2 99%   BMI 23.03 kg/m   Physical Exam Constitutional:      Appearance: He is well-developed.  HENT:     Mouth/Throat:     Comments: No abscess visualized amenable to drainage. There is tenderness surrounding left upper rear molar and left maxillary facial swelling. Benign oropharynx.  Pulmonary:     Effort: Pulmonary effort is normal.  Musculoskeletal:        General: Normal range of motion.     Cervical back: Normal range of motion.  Skin:    General: Skin is warm and dry.  Neurological:     Mental Status: He is alert and oriented to person, place, and time.     ED Results / Procedures / Treatments   Labs (all labs ordered are listed, but only abnormal results are displayed) Labs Reviewed  CBC WITH DIFFERENTIAL/PLATELET  COMPREHENSIVE METABOLIC PANEL    EKG None  Radiology No results found.  Procedures Procedures (including critical care time)  Medications Ordered in ED Medications  HYDROcodone-acetaminophen (NORCO/VICODIN) 5-325 MG per tablet 1 tablet (has no administration in time range)  ibuprofen (ADVIL) tablet 600 mg (has no administration in time range)  penicillin v potassium (VEETID) tablet 500 mg (has no administration in time range)    ED Course  I have reviewed the triage vital signs and the nursing notes.  Pertinent labs & imaging results that were available during my care of the  patient were reviewed by me and considered in my medical decision making (see chart for details).    MDM Rules/Calculators/A&P                      Patient to ED with dental pain and facial swelling x 2 days.   He will requiring penicillin for dental infection. Dental follow up resources provided. Limited pain medication and ibuprofen provided.   Final Clinical Impression(s) / ED Diagnoses Final diagnoses:  None   1. Dental infection   Rx / DC Orders ED Discharge Orders    None       Elpidio Anis, PA-C 05/11/19 Maxie Barb, MD 05/14/19 [phone]

## 2019-05-10 NOTE — ED Triage Notes (Signed)
Pt states he has a left side dental abscess getting worse, more swollen and pain noticed.

## 2019-05-10 NOTE — Discharge Instructions (Addendum)
You will need to follow up with dentistry for further management of recurrent dental pain.   Take medications as prescribed.

## 2019-05-10 NOTE — ED Notes (Signed)
Patient verbalizes understanding of discharge instructions. Opportunity for questioning and answers were provided. Armband removed by staff, pt discharged from ED. Pt. ambulatory and discharged home.  

## 2019-05-11 DIAGNOSIS — I428 Other cardiomyopathies: Secondary | ICD-10-CM | POA: Insufficient documentation

## 2019-05-12 ENCOUNTER — Encounter: Payer: Self-pay | Admitting: Internal Medicine

## 2019-05-12 ENCOUNTER — Ambulatory Visit (INDEPENDENT_AMBULATORY_CARE_PROVIDER_SITE_OTHER): Payer: Medicaid Other | Admitting: Internal Medicine

## 2019-05-12 ENCOUNTER — Other Ambulatory Visit: Payer: Self-pay

## 2019-05-12 VITALS — BP 122/80 | HR 87 | Ht 63.0 in | Wt 124.4 lb

## 2019-05-12 DIAGNOSIS — I428 Other cardiomyopathies: Secondary | ICD-10-CM | POA: Diagnosis not present

## 2019-05-12 DIAGNOSIS — Q246 Congenital heart block: Secondary | ICD-10-CM

## 2019-05-12 DIAGNOSIS — Z950 Presence of cardiac pacemaker: Secondary | ICD-10-CM

## 2019-05-12 MED ORDER — CARVEDILOL 3.125 MG PO TABS: 3.1250 mg | ORAL_TABLET | Freq: Two times a day (BID) | ORAL | 3 refills | Status: DC

## 2019-05-12 MED ORDER — LISINOPRIL 5 MG PO TABS: 5.0000 mg | ORAL_TABLET | Freq: Every day | ORAL | 3 refills | Status: DC

## 2019-05-12 NOTE — Progress Notes (Signed)
Patient Care Team: Bartholome Bill, MD as PCP - General (Family Medicine)   HPI  Mario Wyatt is a 29 y.o. male Is seen to establish care for pacemaker implanted for high-grade heart block at the age of 29 years old. 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 He underwent generator replacement in 2008 receiving a St. Jude device. . He has had no syncope. He has no limitations exercise tolerance.  His initial implant was subpectoral.   His change out procedure done with WC was complicated by fracture of the atrial lead during revision and separation of the RV pin./Lead;  he required 2 new leads.  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  With progressive LV dysfunction, he underwent extraction of his original leads insertion of a CRT-P system 4/19.  The patient denies chest pain, shortness of breath, nocturnal dyspnea, orthopnea or peripheral edema.  There have been no palpitations, lightheadedness or syncope.    DATE TEST EF   7/18 Echo  35-40%   1/19 Echo   30-35 %   5/20 Echo  35-40     admitted 2/20 with agitation and psychosis; carries a diagnosis of schizophrenia       Past Medical History:  Diagnosis Date  . Anxiety   . Arthritis    "wrists, knees; probably all over my body" (04/05/2017)  . Asthma   . Attention deficit hyperactivity disorder (ADHD)    pt denies this hx on 04/05/2017  . Cardiomyopathy ([place])   . Chronic back pain    "right neck to lower back; shoulders" (04/05/2017)  . Congenital complete AV block   . Daily headache   . GERD (gastroesophageal reflux disease)   . Macular degeneration    bilateral  . Migraine    "a couple/month" (04/05/2017)  . Pacemaker    Placed when pt was 29 years old.  . S/P cardiac pacemaker procedure, 09/08/14, St Jude medical 09/09/2014  . Wears glasses     Past Surgical History:  Procedure Laterality Date  . CARDIAC PACEMAKER PLACEMENT  2007   second  degree heart block  . EP IMPLANTABLE DEVICE N/A 09/08/2014   Procedure: Pacemaker Implant;  Surgeon: Deboraha Sprang, MD;  Location: [facility];  Service: Cardiovascular;  Laterality: N/A;  . ESOPHAGOGASTRODUODENOSCOPY N/A 02/18/2018   Procedure: ESOPHAGOGASTRODUODENOSCOPY (EGD);  Surgeon: Ladene Artist, MD;  Location: [facility] ENDOSCOPY;  Service: Endoscopy;  Laterality: N/A;  . FOREIGN BODY REMOVAL N/A 02/18/2018   Procedure: FOREIGN BODY REMOVAL;  Surgeon: Ladene Artist, MD;  Location: [facility] ENDOSCOPY;  Service: Endoscopy;  Laterality: N/A;  . HERNIA REPAIR    . PACEMAKER INSERTION  1999  . PACEMAKER LEAD REMOVAL N/A 04/04/2017   Procedure: LEAD EXTRACTION AND NEW LEAD PLACEMENT AND CHANGE OUT OF OLD PACEMAKER;  Surgeon: Evans Lance, MD;  Location: [facility];  Service: Cardiovascular;  Laterality: N/A;  . PERMANENT PACEMAKER GENERATOR CHANGE  04/04/2017   LEAD EXTRACTION AND NEW LEAD PLACEMENT AND CHANGE OUT OF OLD PACEMAKER  . UMBILICAL HERNIA REPAIR     "when I was a child"    Current Outpatient Medications  Medication Sig Dispense Refill  . acetaminophen (TYLENOL) 500 MG tablet Take 1,000 mg by mouth daily as needed for mild pain.    Marland Kitchen amoxicillin-clavulanate (AUGMENTIN) 875-125 MG tablet Take 1 tablet by mouth 2 (two) times daily.    Marland Kitchen EPINEPHrine (EPIPEN 2-PAK) 0.3 mg/0.3 mL IJ SOAJ injection  Inject 0.3 mLs (0.3 mg total) into the muscle once as needed (anaphylaxis). 1 Device 0  . gabapentin (NEURONTIN) 300 MG capsule Take 300 mg by mouth 3 (three) times daily.    Marland Kitchen HYDROcodone-acetaminophen (NORCO/VICODIN) 5-325 MG tablet Take 1 tablet by mouth every 4 (four) hours as needed for severe pain. 5 tablet 0  . ibuprofen (ADVIL) 600 MG tablet Take 1 tablet (600 mg total) by mouth every 6 (six) hours as needed. 30 tablet 0  . naproxen (NAPROSYN) 500 MG tablet Take 1 tablet (500 mg total) by mouth 2 (two) times daily. 15 tablet 0  . penicillin v potassium (VEETID) 500 MG tablet Take 1 tablet  (500 mg total) by mouth 4 (four) times daily for 10 days. 40 tablet 0  . PROAIR HFA 108 (90 Base) MCG/ACT inhaler Inhale 2 puffs into the lungs every 6 (six) hours as needed for wheezing.    . carvedilol (COREG) 3.125 MG tablet Take 3.125 mg by mouth 2 (two) times a day.    . lisinopril (PRINIVIL,ZESTRIL) 5 MG tablet Take 1 tablet (5 mg total) by mouth daily. For high blood pressure (Patient not taking: Reported on 05/12/2019) 15 tablet 0   No current facility-administered medications for this visit.   Soc hx  He is working somoking some and drinking some   He has an identical twin brother  On October  Review of Systems negative except from HPI and PMH  Physical Exam BP 122/80   Pulse 87   Ht 5\' 3"  (1.6 m)   Wt 124 lb 6.4 oz (56.4 kg)   SpO2 95%   BMI 22.04 kg/m  Well developed and well nourished in no acute distress HENT normal Neck supple with JVP-flat Clear Device pocket well healed; without hematoma or erythema.  There is no tethering  Regular rate and rhythm, no murmur Abd-soft with active BS No Clubbing cyanosis  edema Skin-warm and dry A & Oriented  Grossly normal sensory and motor function  ECG  Sinus @ 85 P-synchronous/ BiV  pacing   Assessment and  Plan  High Grade Heart Block  Pacemaker  St Jude  The patient's device was interrogated.  The information was reviewed. No changes were made in the programming.     Cardiomyopathy question mechanism  Stress    Euvolemic continue current med plan, but has run out of ACE and BB -- will refill lisinopril and carvedilol  Less stress;  Seems more mentally level  Rhythm stable   We spent more than 50% of our >25 min visit in face to face counseling regarding the above

## 2019-05-12 NOTE — Patient Instructions (Signed)

## 2019-05-21 LAB — CUP PACEART INCLINIC DEVICE CHECK
Battery Remaining Longevity: 73 mo
Battery Voltage: 2.98 V
Brady Statistic RA Percent Paced: 35 %
Brady Statistic RV Percent Paced: 99.87 %
Date Time Interrogation Session: 20210511153800
Implantable Lead Implant Date: 20160907
Implantable Lead Implant Date: 20160907
Implantable Lead Implant Date: 20190404
Implantable Lead Location: 753858
Implantable Lead Location: 753859
Implantable Lead Location: 753860
Implantable Lead Model: 1948
Implantable Pulse Generator Implant Date: 20190404
Lead Channel Impedance Value: 512.5 Ohm
Lead Channel Impedance Value: 600 Ohm
Lead Channel Impedance Value: 850 Ohm
Lead Channel Pacing Threshold Amplitude: 0.625 V
Lead Channel Pacing Threshold Amplitude: 1 V
Lead Channel Pacing Threshold Amplitude: 1.5 V
Lead Channel Pacing Threshold Pulse Width: 0.5 ms
Lead Channel Pacing Threshold Pulse Width: 0.5 ms
Lead Channel Pacing Threshold Pulse Width: 0.5 ms
Lead Channel Sensing Intrinsic Amplitude: 3.3 mV
Lead Channel Sensing Intrinsic Amplitude: 8.6 mV
Lead Channel Setting Pacing Amplitude: 2 V
Lead Channel Setting Pacing Amplitude: 2 V
Lead Channel Setting Pacing Amplitude: 2.5 V
Lead Channel Setting Pacing Pulse Width: 0.5 ms
Lead Channel Setting Pacing Pulse Width: 0.5 ms
Lead Channel Setting Sensing Sensitivity: 2.5 mV
Pulse Gen Model: 3262
Pulse Gen Serial Number: 9003554

## 2019-07-02 ENCOUNTER — Ambulatory Visit (INDEPENDENT_AMBULATORY_CARE_PROVIDER_SITE_OTHER): Payer: Medicaid Other | Admitting: *Deleted

## 2019-07-02 DIAGNOSIS — I442 Atrioventricular block, complete: Secondary | ICD-10-CM | POA: Diagnosis not present

## 2019-07-02 LAB — CUP PACEART REMOTE DEVICE CHECK
Battery Remaining Longevity: 96 mo
Battery Remaining Percentage: 95.5 %
Battery Voltage: 2.98 V
Brady Statistic AP VP Percent: 34 %
Brady Statistic AP VS Percent: 1 %
Brady Statistic AS VP Percent: 66 %
Brady Statistic AS VS Percent: 1 %
Brady Statistic RA Percent Paced: 33 %
Date Time Interrogation Session: 20210701073602
Implantable Lead Implant Date: 20160907
Implantable Lead Implant Date: 20160907
Implantable Lead Implant Date: 20190404
Implantable Lead Location: 753858
Implantable Lead Location: 753859
Implantable Lead Location: 753860
Implantable Lead Model: 1948
Implantable Pulse Generator Implant Date: 20190404
Lead Channel Impedance Value: 490 Ohm
Lead Channel Impedance Value: 560 Ohm
Lead Channel Impedance Value: 890 Ohm
Lead Channel Pacing Threshold Amplitude: 0.625 V
Lead Channel Pacing Threshold Amplitude: 1 V
Lead Channel Pacing Threshold Amplitude: 1.875 V
Lead Channel Pacing Threshold Pulse Width: 0.5 ms
Lead Channel Pacing Threshold Pulse Width: 0.5 ms
Lead Channel Pacing Threshold Pulse Width: 0.5 ms
Lead Channel Sensing Intrinsic Amplitude: 12 mV
Lead Channel Sensing Intrinsic Amplitude: 3.1 mV
Lead Channel Setting Pacing Amplitude: 2 V
Lead Channel Setting Pacing Amplitude: 2 V
Lead Channel Setting Pacing Amplitude: 2.875
Lead Channel Setting Pacing Pulse Width: 0.5 ms
Lead Channel Setting Pacing Pulse Width: 0.5 ms
Lead Channel Setting Sensing Sensitivity: 2.5 mV
Pulse Gen Model: 3262
Pulse Gen Serial Number: 9003554

## 2019-07-03 NOTE — Progress Notes (Signed)
Remote pacemaker transmission.   

## 2019-07-23 ENCOUNTER — Telehealth (HOSPITAL_COMMUNITY): Payer: Self-pay | Admitting: Emergency Medicine

## 2019-09-27 ENCOUNTER — Emergency Department (HOSPITAL_COMMUNITY): Payer: Medicaid Other

## 2019-09-27 ENCOUNTER — Emergency Department (HOSPITAL_COMMUNITY)
Admission: EM | Admit: 2019-09-27 | Discharge: 2019-09-27 | Disposition: A | Discharge status: Home / Self Care | Payer: Medicaid Other | Attending: Emergency Medicine | Admitting: Emergency Medicine

## 2019-09-27 ENCOUNTER — Other Ambulatory Visit: Payer: Self-pay

## 2019-09-27 DIAGNOSIS — S99911A Unspecified injury of right ankle, initial encounter: Secondary | ICD-10-CM | POA: Diagnosis present

## 2019-09-27 DIAGNOSIS — Y9356 Activity, jumping rope: Secondary | ICD-10-CM | POA: Diagnosis not present

## 2019-09-27 DIAGNOSIS — Z950 Presence of cardiac pacemaker: Secondary | ICD-10-CM | POA: Diagnosis not present

## 2019-09-27 DIAGNOSIS — S92001A Unspecified fracture of right calcaneus, initial encounter for closed fracture: Secondary | ICD-10-CM

## 2019-09-27 DIAGNOSIS — Y92038 Other place in apartment as the place of occurrence of the external cause: Secondary | ICD-10-CM | POA: Insufficient documentation

## 2019-09-27 DIAGNOSIS — I5022 Chronic systolic (congestive) heart failure: Secondary | ICD-10-CM | POA: Insufficient documentation

## 2019-09-27 DIAGNOSIS — S92014A Nondisplaced fracture of body of right calcaneus, initial encounter for closed fracture: Secondary | ICD-10-CM | POA: Diagnosis not present

## 2019-09-27 DIAGNOSIS — J45909 Unspecified asthma, uncomplicated: Secondary | ICD-10-CM | POA: Insufficient documentation

## 2019-09-27 DIAGNOSIS — F17210 Nicotine dependence, cigarettes, uncomplicated: Secondary | ICD-10-CM | POA: Diagnosis not present

## 2019-09-27 DIAGNOSIS — Z79899 Other long term (current) drug therapy: Secondary | ICD-10-CM | POA: Diagnosis not present

## 2019-09-27 DIAGNOSIS — Z91040 Latex allergy status: Secondary | ICD-10-CM | POA: Diagnosis not present

## 2019-09-27 MED ORDER — HYDROCODONE-ACETAMINOPHEN 5-325 MG PO TABS
1.0000 | ORAL_TABLET | Freq: Once | ORAL | Status: AC
  Administered 2019-09-27: 1 via ORAL
  Filled 2019-09-27: qty 1

## 2019-09-27 MED ORDER — KETOROLAC TROMETHAMINE 15 MG/ML IJ SOLN
30.0000 mg | Freq: Once | INTRAMUSCULAR | Status: AC
  Administered 2019-09-27: 30 mg via INTRAMUSCULAR
  Filled 2019-09-27: qty 2

## 2019-09-27 MED ORDER — HYDROCODONE-ACETAMINOPHEN 5-325 MG PO TABS: 1.0000 | ORAL_TABLET | Freq: Four times a day (QID) | ORAL | 0 refills | Status: DC | PRN

## 2019-09-27 NOTE — Discharge Instructions (Addendum)
Your x-ray showed a fracture to your calcaneus which is your heel bone.  You have been placed in a splint.  Keep this on at all times and do not remove it.  Keep it dry while bathing.  Use crutches to prevent from putting weight on your right leg.  Call the office of Dr. Carola Frost on Monday morning to schedule follow-up in their office to ensure proper healing of your broken bone.  You may take hydrocodone as prescribed for management of pain.  Do not drive or drink alcohol after taking this medication as it may make you drowsy and impair your judgment.  Return to the ED for new or concerning symptoms such as loss of sensation in your toes, paleness to your toes, or severe worsening of your pain.

## 2019-09-27 NOTE — Progress Notes (Signed)
Orthopedic Tech Progress Note Patient Details:  Mario Wyatt Apr 20, 1990 [id number]  Ortho Devices Type of Ortho Device: Crutches, Lenora Boys splint Ortho Device/Splint Location: rle Ortho Device/Splint Interventions: Ordered, Application, Adjustment   Post Interventions Patient Tolerated: Well Instructions Provided: Care of device, Adjustment of device   [name] 09/27/2019, 6:14 AM
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
## 2019-10-01 ENCOUNTER — Ambulatory Visit (INDEPENDENT_AMBULATORY_CARE_PROVIDER_SITE_OTHER): Payer: Medicaid Other

## 2019-10-01 DIAGNOSIS — Q246 Congenital heart block: Secondary | ICD-10-CM

## 2019-10-02 LAB — CUP PACEART REMOTE DEVICE CHECK
Battery Remaining Longevity: 80 mo
Battery Remaining Percentage: 95.5 %
Battery Voltage: 2.98 V
Brady Statistic AP VP Percent: 30 %
Brady Statistic AP VS Percent: 1 %
Brady Statistic AS VP Percent: 70 %
Brady Statistic AS VS Percent: 1 %
Brady Statistic RA Percent Paced: 30 %
Date Time Interrogation Session: 20210930115538
Implantable Lead Implant Date: 20160907
Implantable Lead Implant Date: 20160907
Implantable Lead Implant Date: 20190404
Implantable Lead Location: 753858
Implantable Lead Location: 753859
Implantable Lead Location: 753860
Implantable Lead Model: 1948
Implantable Pulse Generator Implant Date: 20190404
Lead Channel Impedance Value: 450 Ohm
Lead Channel Impedance Value: 540 Ohm
Lead Channel Impedance Value: 760 Ohm
Lead Channel Pacing Threshold Amplitude: 0.625 V
Lead Channel Pacing Threshold Amplitude: 1 V
Lead Channel Pacing Threshold Amplitude: 1.5 V
Lead Channel Pacing Threshold Pulse Width: 0.5 ms
Lead Channel Pacing Threshold Pulse Width: 0.5 ms
Lead Channel Pacing Threshold Pulse Width: 0.5 ms
Lead Channel Sensing Intrinsic Amplitude: 2.6 mV
Lead Channel Sensing Intrinsic Amplitude: 9.4 mV
Lead Channel Setting Pacing Amplitude: 2 V
Lead Channel Setting Pacing Amplitude: 2 V
Lead Channel Setting Pacing Amplitude: 2.5 V
Lead Channel Setting Pacing Pulse Width: 0.5 ms
Lead Channel Setting Pacing Pulse Width: 0.5 ms
Lead Channel Setting Sensing Sensitivity: 2.5 mV
Pulse Gen Model: 3262
Pulse Gen Serial Number: 9003554

## 2019-10-05 NOTE — Progress Notes (Signed)
Remote pacemaker transmission.   

## 2019-10-06 ENCOUNTER — Other Ambulatory Visit: Payer: Self-pay

## 2019-10-06 ENCOUNTER — Ambulatory Visit
Admission: RE | Admit: 2019-10-06 | Discharge: 2019-10-06 | Disposition: A | Discharge status: Home / Self Care | Payer: Medicaid Other | Source: Ambulatory Visit | Attending: Orthopedic Surgery | Admitting: Orthopedic Surgery

## 2019-10-06 ENCOUNTER — Other Ambulatory Visit: Payer: Self-pay | Admitting: Orthopedic Surgery

## 2019-10-06 DIAGNOSIS — S99001A Unspecified physeal fracture of right calcaneus, initial encounter for closed fracture: Secondary | ICD-10-CM

## 2019-10-12 ENCOUNTER — Other Ambulatory Visit: Payer: Self-pay

## 2019-10-12 ENCOUNTER — Encounter: Payer: Self-pay | Admitting: Internal Medicine

## 2019-10-12 ENCOUNTER — Encounter (HOSPITAL_COMMUNITY): Payer: Self-pay | Admitting: Orthopedic Surgery

## 2019-10-12 NOTE — Progress Notes (Unsigned)
PERIOPERATIVE PRESCRIPTION FOR IMPLANTED CARDIAC DEVICE PROGRAMMING  Patient Information: Name:  Mario Wyatt  DOB:  1990-03-18  MRN:  [id number]  {TIP - You do not have to delete this tip  -  Copy the info from the staff message sent by the PAT staff  then press F2 here and paste the information using CTL - V on the next line :[id number]}  Planned Procedure: Open Reduction Internal fixation Calcaneous fracture  Surgeon: Dr. Carola Frost  Date of Procedure: 10/13/19  Cautery will be used.  Position during surgery: unknown   Please send documentation back to:  [facility] (Fax # [phone])   Device Information:  Clinic EP Physician:  Sherryl Manges, MD   Device Type:  Pacemaker Manufacturer and Phone #:  St. Jude/[employer]ott: [phone] Pacemaker Dependent?:  No. Date of Last Device Check:  10/01/19 Normal Device Function?:  Yes.    Electrophysiologist's Recommendations:   Have magnet available.  Provide continuous ECG monitoring when magnet is used or reprogramming is to be performed.   Procedure should not interfere with device function.  No device programming or magnet placement needed.  Per Device Clinic Standing Orders, Lenor Coffin, RN  4:37 PM 10/12/2019

## 2019-10-12 NOTE — Progress Notes (Signed)
Spoke with pt for pre-op call. Pt has hx of congenital heart disease and has a pacemaker. Dr. Graciela Husbands is his cardiologist. Information sent to the Device clinic. Email sent to Guerry Bruin with [facility]. Jude notifying him of pt's surgery.  Pt denies diabetes or HTN.  Will need Covid test on arrival.  Pt states he does not have anyone that can stay with him the 24 hours after surgery. His brother can pick him up, but can't stay with him.  Chart sent to Anesthesia PA for review.
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
## 2019-10-12 NOTE — Anesthesia Preprocedure Evaluation (Addendum)
Anesthesia Evaluation  Patient identified by MRN, date of birth, ID band Patient awake    Reviewed: Allergy & Precautions, NPO status , Patient's Chart, lab work & pertinent test results  Airway Mallampati: II  TM Distance: >3 FB Neck ROM: Full    Dental no notable dental hx.    Pulmonary asthma , Current Smoker,    Pulmonary exam normal breath sounds clear to auscultation       Cardiovascular Exercise Tolerance: Good + dysrhythmias (congenital complete A-V block) + pacemaker  Rhythm:Regular Rate:Tachycardia  visit with EP Dr. Graciela Husbands on 05/12/19. PPM [employer] Jude Medical Quadra Allure MP RF 3262 CRT-P.  No PPM changes made at that visit.  27-month follow-up recommended. Of note, his LV dysfunction was discovered on an echo in 2018 with [facility] (no comparison echocardiograms were readily vailable from [facility] where he had received previous pediatric cardiology care). According to 10/04/16 EP note by [name], PA-C, "I discussed case with Dr. Graciela Husbands, he suspected/wondered if new RV lead position 100% pacing likely caused CM, and symptom chronicity, young age did feel ischemic w/u was needed." Last LVEF 35-40% on 10/06/19.    Neuro/Psych  Headaches, PSYCHIATRIC DISORDERS Anxiety Schizophrenia    GI/Hepatic Neg liver ROS, GERD  ,  Endo/Other  negative endocrine ROS  Renal/GU negative Renal ROS     Musculoskeletal  (+) Arthritis ,   Abdominal Normal abdominal exam  (+)   Peds  (+) Congenital Heart Disease Hematology negative hematology ROS (+)   Anesthesia Other Findings Chronic back pain  Reproductive/Obstetrics negative OB ROS                           Anesthesia Physical Anesthesia Plan  ASA: IV  Anesthesia Plan: General   Post-op Pain Management: GA combined w/ Regional for post-op pain   Induction: Intravenous  PONV Risk Score and Plan: 1 and Midazolam, Dexamethasone, Treatment may vary due  to age or medical condition and Ondansetron  Airway Management Planned: LMA  Additional Equipment:   Intra-op Plan:   Post-operative Plan:   Informed Consent: I have reviewed the patients History and Physical, chart, labs and discussed the procedure including the risks, benefits and alternatives for the proposed anesthesia with the patient or authorized representative who has indicated his/her understanding and acceptance.     Dental advisory given  Plan Discussed with: CRNA and Anesthesiologist  Anesthesia Plan Comments: ( Patient does not have a ride or anyone to stay with him at home. Will discuss with surgeon whether he is appropriate for admission, if not, he may need to reschedule. Dr. Carola Frost plans to admit patient. Patient is insistent on General anesthesia. Will do popliteal block for postop pain control.  )    Anesthesia Quick Evaluation

## 2019-10-13 ENCOUNTER — Ambulatory Visit (HOSPITAL_COMMUNITY): Payer: Medicaid Other | Admitting: Vascular Surgery

## 2019-10-13 ENCOUNTER — Ambulatory Visit (HOSPITAL_COMMUNITY): Payer: Medicaid Other

## 2019-10-13 ENCOUNTER — Inpatient Hospital Stay (HOSPITAL_COMMUNITY)
Admission: RE | Admit: 2019-10-13 | Discharge: 2019-10-16 | DRG: 503 | Disposition: A | Discharge status: Home / Self Care | Payer: Medicaid Other | Attending: Orthopedic Surgery | Admitting: Orthopedic Surgery

## 2019-10-13 ENCOUNTER — Observation Stay (HOSPITAL_COMMUNITY): Payer: Medicaid Other

## 2019-10-13 ENCOUNTER — Other Ambulatory Visit: Payer: Self-pay

## 2019-10-13 ENCOUNTER — Encounter (HOSPITAL_COMMUNITY)
Admission: RE | Disposition: A | Discharge status: Home / Self Care | Payer: Self-pay | Source: Home / Self Care | Attending: Orthopedic Surgery

## 2019-10-13 ENCOUNTER — Encounter (HOSPITAL_COMMUNITY): Payer: Self-pay | Admitting: Orthopedic Surgery

## 2019-10-13 DIAGNOSIS — F209 Schizophrenia, unspecified: Secondary | ICD-10-CM | POA: Diagnosis present

## 2019-10-13 DIAGNOSIS — Z818 Family history of other mental and behavioral disorders: Secondary | ICD-10-CM

## 2019-10-13 DIAGNOSIS — F419 Anxiety disorder, unspecified: Secondary | ICD-10-CM | POA: Diagnosis present

## 2019-10-13 DIAGNOSIS — F17200 Nicotine dependence, unspecified, uncomplicated: Secondary | ICD-10-CM | POA: Diagnosis present

## 2019-10-13 DIAGNOSIS — D62 Acute posthemorrhagic anemia: Secondary | ICD-10-CM | POA: Diagnosis not present

## 2019-10-13 DIAGNOSIS — I5022 Chronic systolic (congestive) heart failure: Secondary | ICD-10-CM | POA: Diagnosis present

## 2019-10-13 DIAGNOSIS — Z813 Family history of other psychoactive substance abuse and dependence: Secondary | ICD-10-CM

## 2019-10-13 DIAGNOSIS — X501XXA Overexertion from prolonged static or awkward postures, initial encounter: Secondary | ICD-10-CM

## 2019-10-13 DIAGNOSIS — Z83511 Family history of glaucoma: Secondary | ICD-10-CM

## 2019-10-13 DIAGNOSIS — Z79899 Other long term (current) drug therapy: Secondary | ICD-10-CM

## 2019-10-13 DIAGNOSIS — T148XXA Other injury of unspecified body region, initial encounter: Secondary | ICD-10-CM

## 2019-10-13 DIAGNOSIS — Z91013 Allergy to seafood: Secondary | ICD-10-CM

## 2019-10-13 DIAGNOSIS — Z833 Family history of diabetes mellitus: Secondary | ICD-10-CM

## 2019-10-13 DIAGNOSIS — Z91040 Latex allergy status: Secondary | ICD-10-CM

## 2019-10-13 DIAGNOSIS — S92061A Displaced intraarticular fracture of right calcaneus, initial encounter for closed fracture: Principal | ICD-10-CM | POA: Diagnosis present

## 2019-10-13 DIAGNOSIS — F319 Bipolar disorder, unspecified: Secondary | ICD-10-CM | POA: Diagnosis present

## 2019-10-13 DIAGNOSIS — F909 Attention-deficit hyperactivity disorder, unspecified type: Secondary | ICD-10-CM | POA: Diagnosis present

## 2019-10-13 DIAGNOSIS — Z91041 Radiographic dye allergy status: Secondary | ICD-10-CM

## 2019-10-13 DIAGNOSIS — I429 Cardiomyopathy, unspecified: Secondary | ICD-10-CM | POA: Diagnosis present

## 2019-10-13 DIAGNOSIS — Y92039 Unspecified place in apartment as the place of occurrence of the external cause: Secondary | ICD-10-CM

## 2019-10-13 DIAGNOSIS — Z8249 Family history of ischemic heart disease and other diseases of the circulatory system: Secondary | ICD-10-CM

## 2019-10-13 DIAGNOSIS — I110 Hypertensive heart disease with heart failure: Secondary | ICD-10-CM | POA: Diagnosis present

## 2019-10-13 DIAGNOSIS — Z20822 Contact with and (suspected) exposure to covid-19: Secondary | ICD-10-CM | POA: Diagnosis present

## 2019-10-13 DIAGNOSIS — Z950 Presence of cardiac pacemaker: Secondary | ICD-10-CM

## 2019-10-13 DIAGNOSIS — Q246 Congenital heart block: Secondary | ICD-10-CM

## 2019-10-13 DIAGNOSIS — E559 Vitamin D deficiency, unspecified: Secondary | ICD-10-CM | POA: Diagnosis present

## 2019-10-13 DIAGNOSIS — K219 Gastro-esophageal reflux disease without esophagitis: Secondary | ICD-10-CM | POA: Diagnosis present

## 2019-10-13 DIAGNOSIS — Z419 Encounter for procedure for purposes other than remedying health state, unspecified: Secondary | ICD-10-CM

## 2019-10-13 DIAGNOSIS — R29890 Loss of height: Secondary | ICD-10-CM | POA: Diagnosis present

## 2019-10-13 DIAGNOSIS — F17210 Nicotine dependence, cigarettes, uncomplicated: Secondary | ICD-10-CM | POA: Diagnosis present

## 2019-10-13 HISTORY — PX: ORIF CALCANEOUS FRACTURE: SHX5030

## 2019-10-13 LAB — COMPREHENSIVE METABOLIC PANEL
ALT: 18 U/L (ref 0–44)
AST: 23 U/L (ref 15–41)
Albumin: 4.3 g/dL (ref 3.5–5.0)
Alkaline Phosphatase: 64 U/L (ref 38–126)
Anion gap: 10 (ref 5–15)
BUN: 9 mg/dL (ref 6–20)
CO2: 25 mmol/L (ref 22–32)
Calcium: 9.4 mg/dL (ref 8.9–10.3)
Chloride: 103 mmol/L (ref 98–111)
Creatinine, Ser: 1 mg/dL (ref 0.61–1.24)
GFR, Estimated: >60 mL/min (ref >60)
Glucose, Bld: 85 mg/dL (ref 70–99)
Potassium: 3.9 mmol/L (ref 3.5–5.1)
Sodium: 138 mmol/L (ref 135–145)
Total Bilirubin: 0.8 mg/dL (ref 0.3–1.2)
Total Protein: 7.1 g/dL (ref 6.5–8.1)

## 2019-10-13 LAB — CBC
HCT: 47.6 % (ref 39.0–52.0)
Hemoglobin: 16.1 g/dL (ref 13.0–17.0)
MCH: 31.5 pg (ref 26.0–34.0)
MCHC: 33.8 g/dL (ref 30.0–36.0)
MCV: 93.2 fL (ref 80.0–100.0)
Platelets: 279 10*3/uL (ref 150–400)
RBC: 5.11 MIL/uL (ref 4.22–5.81)
RDW: 12.9 % (ref 11.5–15.5)
WBC: 6.3 10*3/uL (ref 4.0–10.5)
nRBC: 0 % (ref 0.0–0.2)

## 2019-10-13 LAB — SARS CORONAVIRUS 2 BY RT PCR (HOSPITAL ORDER, PERFORMED IN ~~LOC~~ HOSPITAL LAB): SARS Coronavirus 2: NEGATIVE

## 2019-10-13 LAB — SURGICAL PCR SCREEN
MRSA, PCR: NEGATIVE
Staphylococcus aureus: NEGATIVE

## 2019-10-13 LAB — VITAMIN D 25 HYDROXY (VIT D DEFICIENCY, FRACTURES): Vit D, 25-Hydroxy: 19.54 ng/mL — ABNORMAL LOW (ref 30–100)

## 2019-10-13 SURGERY — OPEN REDUCTION INTERNAL FIXATION (ORIF) CALCANEOUS FRACTURE
Anesthesia: Regional | Site: Foot | Laterality: Right

## 2019-10-13 MED ORDER — LACTATED RINGERS IV SOLN: INTRAVENOUS | Status: DC

## 2019-10-13 MED ORDER — MIDAZOLAM HCL 2 MG/2ML IJ SOLN: 2.0000 mg | Freq: Once | INTRAMUSCULAR | Status: AC

## 2019-10-13 MED ORDER — CEFAZOLIN SODIUM-DEXTROSE 2-4 GM/100ML-% IV SOLN
2.0000 g | INTRAVENOUS | Status: AC
  Administered 2019-10-13: 2 g via INTRAVENOUS
  Filled 2019-10-13: qty 100

## 2019-10-13 MED ORDER — ONDANSETRON HCL 4 MG/2ML IJ SOLN
INTRAMUSCULAR | Status: DC | PRN
  Administered 2019-10-13: 4 mg via INTRAVENOUS

## 2019-10-13 MED ORDER — CARVEDILOL 3.125 MG PO TABS
ORAL_TABLET | ORAL | Status: AC
  Filled 2019-10-13: qty 1

## 2019-10-13 MED ORDER — MUPIROCIN 2 % EX OINT
TOPICAL_OINTMENT | CUTANEOUS | Status: AC
  Administered 2019-10-13: 1 via TOPICAL
  Filled 2019-10-13: qty 22

## 2019-10-13 MED ORDER — POTASSIUM CHLORIDE IN NACL 20-0.9 MEQ/L-% IV SOLN
INTRAVENOUS | Status: DC
  Filled 2019-10-13: qty 1000

## 2019-10-13 MED ORDER — ALBUMIN HUMAN 5 % IV SOLN: INTRAVENOUS | Status: DC | PRN

## 2019-10-13 MED ORDER — CELECOXIB 200 MG PO CAPS
400.0000 mg | ORAL_CAPSULE | Freq: Once | ORAL | Status: AC
  Administered 2019-10-13: 400 mg via ORAL
  Filled 2019-10-13: qty 2

## 2019-10-13 MED ORDER — MIDAZOLAM HCL 2 MG/2ML IJ SOLN
INTRAMUSCULAR | Status: AC
  Filled 2019-10-13: qty 2

## 2019-10-13 MED ORDER — CHLORHEXIDINE GLUCONATE 0.12 % MT SOLN
15.0000 mL | Freq: Once | OROMUCOSAL | Status: AC
  Administered 2019-10-13: 15 mL via OROMUCOSAL
  Filled 2019-10-13: qty 15

## 2019-10-13 MED ORDER — SUGAMMADEX SODIUM 200 MG/2ML IV SOLN
INTRAVENOUS | Status: DC | PRN
  Administered 2019-10-13: 200 mg via INTRAVENOUS

## 2019-10-13 MED ORDER — CARVEDILOL 3.125 MG PO TABS
3.1250 mg | ORAL_TABLET | Freq: Two times a day (BID) | ORAL | Status: DC
  Administered 2019-10-14 – 2019-10-16 (×5): 3.125 mg via ORAL
  Filled 2019-10-13 (×5): qty 1

## 2019-10-13 MED ORDER — ROCURONIUM BROMIDE 10 MG/ML (PF) SYRINGE
PREFILLED_SYRINGE | INTRAVENOUS | Status: DC | PRN
  Administered 2019-10-13: 50 mg via INTRAVENOUS

## 2019-10-13 MED ORDER — ONDANSETRON HCL 4 MG PO TABS: 4.0000 mg | ORAL_TABLET | Freq: Four times a day (QID) | ORAL | Status: DC | PRN

## 2019-10-13 MED ORDER — METHOCARBAMOL 500 MG PO TABS
750.0000 mg | ORAL_TABLET | Freq: Four times a day (QID) | ORAL | Status: DC
  Administered 2019-10-13 – 2019-10-16 (×10): 750 mg via ORAL
  Filled 2019-10-13 (×10): qty 2

## 2019-10-13 MED ORDER — OXYCODONE HCL 5 MG PO TABS: 5.0000 mg | ORAL_TABLET | Freq: Once | ORAL | Status: DC | PRN

## 2019-10-13 MED ORDER — GABAPENTIN 300 MG PO CAPS
300.0000 mg | ORAL_CAPSULE | Freq: Three times a day (TID) | ORAL | Status: DC
  Administered 2019-10-13 – 2019-10-16 (×8): 300 mg via ORAL
  Filled 2019-10-13 (×8): qty 1

## 2019-10-13 MED ORDER — CEFAZOLIN SODIUM-DEXTROSE 1-4 GM/50ML-% IV SOLN
1.0000 g | Freq: Four times a day (QID) | INTRAVENOUS | Status: AC
  Administered 2019-10-13 – 2019-10-14 (×3): 1 g via INTRAVENOUS
  Filled 2019-10-13 (×3): qty 50

## 2019-10-13 MED ORDER — HYDROMORPHONE HCL 1 MG/ML IJ SOLN: 0.2500 mg | INTRAMUSCULAR | Status: DC | PRN

## 2019-10-13 MED ORDER — FENTANYL CITRATE (PF) 100 MCG/2ML IJ SOLN: 50.0000 ug | Freq: Once | INTRAMUSCULAR | Status: AC

## 2019-10-13 MED ORDER — METOCLOPRAMIDE HCL 5 MG PO TABS: 5.0000 mg | ORAL_TABLET | Freq: Three times a day (TID) | ORAL | Status: DC | PRN

## 2019-10-13 MED ORDER — MIDAZOLAM HCL 2 MG/2ML IJ SOLN
INTRAMUSCULAR | Status: DC | PRN
  Administered 2019-10-13: 2 mg via INTRAVENOUS

## 2019-10-13 MED ORDER — DEXAMETHASONE SODIUM PHOSPHATE 10 MG/ML IJ SOLN
INTRAMUSCULAR | Status: DC | PRN
  Administered 2019-10-13: 10 mg via INTRAVENOUS

## 2019-10-13 MED ORDER — METOCLOPRAMIDE HCL 5 MG/ML IJ SOLN: 5.0000 mg | Freq: Three times a day (TID) | INTRAMUSCULAR | Status: DC | PRN

## 2019-10-13 MED ORDER — ENOXAPARIN SODIUM 40 MG/0.4ML ~~LOC~~ SOLN
40.0000 mg | SUBCUTANEOUS | Status: DC
  Administered 2019-10-14 – 2019-10-16 (×3): 40 mg via SUBCUTANEOUS
  Filled 2019-10-13 (×3): qty 0.4

## 2019-10-13 MED ORDER — DEXAMETHASONE SODIUM PHOSPHATE 10 MG/ML IJ SOLN
INTRAMUSCULAR | Status: AC
  Filled 2019-10-13: qty 1

## 2019-10-13 MED ORDER — CHLORHEXIDINE GLUCONATE 4 % EX LIQD: 60.0000 mL | Freq: Once | CUTANEOUS | Status: DC

## 2019-10-13 MED ORDER — ACETAMINOPHEN 325 MG PO TABS
650.0000 mg | ORAL_TABLET | Freq: Two times a day (BID) | ORAL | Status: DC
  Administered 2019-10-13 – 2019-10-15 (×5): 650 mg via ORAL
  Filled 2019-10-13 (×6): qty 2

## 2019-10-13 MED ORDER — LISINOPRIL 5 MG PO TABS
5.0000 mg | ORAL_TABLET | Freq: Every day | ORAL | Status: DC
  Administered 2019-10-13 – 2019-10-16 (×4): 5 mg via ORAL
  Filled 2019-10-13 (×3): qty 1

## 2019-10-13 MED ORDER — OXYCODONE-ACETAMINOPHEN 7.5-325 MG PO TABS
1.0000 | ORAL_TABLET | Freq: Four times a day (QID) | ORAL | Status: DC | PRN
  Administered 2019-10-14 – 2019-10-16 (×5): 2 via ORAL
  Filled 2019-10-13 (×6): qty 2

## 2019-10-13 MED ORDER — LIDOCAINE 2% (20 MG/ML) 5 ML SYRINGE
INTRAMUSCULAR | Status: AC
  Filled 2019-10-13: qty 5

## 2019-10-13 MED ORDER — PHENYLEPHRINE HCL (PRESSORS) 10 MG/ML IV SOLN
INTRAVENOUS | Status: DC | PRN
  Administered 2019-10-13 (×2): 80 ug via INTRAVENOUS

## 2019-10-13 MED ORDER — FENTANYL CITRATE (PF) 250 MCG/5ML IJ SOLN
INTRAMUSCULAR | Status: AC
Start: 2019-10-13 — End: ?
  Filled 2019-10-13: qty 5

## 2019-10-13 MED ORDER — ACETAMINOPHEN 325 MG PO TABS: 325.0000 mg | ORAL_TABLET | Freq: Four times a day (QID) | ORAL | Status: DC | PRN

## 2019-10-13 MED ORDER — MIDAZOLAM HCL 2 MG/2ML IJ SOLN
INTRAMUSCULAR | Status: AC
  Administered 2019-10-13: 2 mg via INTRAVENOUS
  Filled 2019-10-13: qty 2

## 2019-10-13 MED ORDER — ONDANSETRON HCL 4 MG/2ML IJ SOLN
INTRAMUSCULAR | Status: AC
  Filled 2019-10-13: qty 2

## 2019-10-13 MED ORDER — ACETAMINOPHEN 500 MG PO TABS
1000.0000 mg | ORAL_TABLET | Freq: Once | ORAL | Status: AC
  Administered 2019-10-13: 1000 mg via ORAL
  Filled 2019-10-13: qty 2

## 2019-10-13 MED ORDER — ORAL CARE MOUTH RINSE: 15.0000 mL | Freq: Once | OROMUCOSAL | Status: AC

## 2019-10-13 MED ORDER — PROPOFOL 10 MG/ML IV BOLUS
INTRAVENOUS | Status: DC | PRN
  Administered 2019-10-13: 120 mg via INTRAVENOUS

## 2019-10-13 MED ORDER — ROPIVACAINE HCL 5 MG/ML IJ SOLN
INTRAMUSCULAR | Status: DC | PRN
  Administered 2019-10-13: 30 mL via EPIDURAL

## 2019-10-13 MED ORDER — PHENYLEPHRINE HCL-NACL 10-0.9 MG/250ML-% IV SOLN
INTRAVENOUS | Status: DC | PRN
  Administered 2019-10-13: 15 ug/min via INTRAVENOUS

## 2019-10-13 MED ORDER — BISACODYL 5 MG PO TBEC: 5.0000 mg | DELAYED_RELEASE_TABLET | Freq: Every day | ORAL | Status: DC | PRN

## 2019-10-13 MED ORDER — ALBUTEROL SULFATE (2.5 MG/3ML) 0.083% IN NEBU: 3.0000 mL | INHALATION_SOLUTION | Freq: Four times a day (QID) | RESPIRATORY_TRACT | Status: DC | PRN

## 2019-10-13 MED ORDER — PROPOFOL 10 MG/ML IV BOLUS
INTRAVENOUS | Status: AC
  Filled 2019-10-13: qty 20

## 2019-10-13 MED ORDER — OXYCODONE HCL 5 MG/5ML PO SOLN: 5.0000 mg | Freq: Once | ORAL | Status: DC | PRN

## 2019-10-13 MED ORDER — HYDROMORPHONE HCL 1 MG/ML IJ SOLN
0.5000 mg | INTRAMUSCULAR | Status: DC | PRN
  Administered 2019-10-13: 1 mg via INTRAVENOUS
  Administered 2019-10-13: 0.5 mg via INTRAVENOUS
  Administered 2019-10-14 (×2): 1 mg via INTRAVENOUS
  Administered 2019-10-16: 0.5 mg via INTRAVENOUS
  Filled 2019-10-13 (×3): qty 1
  Filled 2019-10-13: qty 0.5

## 2019-10-13 MED ORDER — CARVEDILOL 3.125 MG PO TABS
3.1250 mg | ORAL_TABLET | Freq: Once | ORAL | Status: AC
  Administered 2019-10-13: 3.125 mg via ORAL

## 2019-10-13 MED ORDER — PROMETHAZINE HCL 25 MG/ML IJ SOLN: 6.2500 mg | INTRAMUSCULAR | Status: DC | PRN

## 2019-10-13 MED ORDER — GABAPENTIN 300 MG PO CAPS
300.0000 mg | ORAL_CAPSULE | Freq: Once | ORAL | Status: AC
  Administered 2019-10-13: 300 mg via ORAL
  Filled 2019-10-13: qty 1

## 2019-10-13 MED ORDER — LIDOCAINE 2% (20 MG/ML) 5 ML SYRINGE
INTRAMUSCULAR | Status: DC | PRN
  Administered 2019-10-13: 40 mg via INTRAVENOUS

## 2019-10-13 MED ORDER — METHOCARBAMOL 1000 MG/10ML IJ SOLN
500.0000 mg | Freq: Four times a day (QID) | INTRAVENOUS | Status: DC
  Filled 2019-10-13 (×14): qty 5

## 2019-10-13 MED ORDER — DOCUSATE SODIUM 100 MG PO CAPS
100.0000 mg | ORAL_CAPSULE | Freq: Two times a day (BID) | ORAL | Status: DC
  Administered 2019-10-13 – 2019-10-16 (×6): 100 mg via ORAL
  Filled 2019-10-13 (×6): qty 1

## 2019-10-13 MED ORDER — FENTANYL CITRATE (PF) 250 MCG/5ML IJ SOLN
INTRAMUSCULAR | Status: DC | PRN
Start: 2019-10-13 — End: 2019-10-13
  Administered 2019-10-13 (×2): 25 ug via INTRAVENOUS
  Administered 2019-10-13: 50 ug via INTRAVENOUS
  Administered 2019-10-13: 100 ug via INTRAVENOUS
  Administered 2019-10-13: 50 ug via INTRAVENOUS

## 2019-10-13 MED ORDER — MAGNESIUM HYDROXIDE 400 MG/5ML PO SUSP: 30.0000 mL | Freq: Every day | ORAL | Status: DC | PRN

## 2019-10-13 MED ORDER — CARVEDILOL 3.125 MG PO TABS: 3.1250 mg | ORAL_TABLET | Freq: Two times a day (BID) | ORAL | Status: DC

## 2019-10-13 MED ORDER — FENTANYL CITRATE (PF) 100 MCG/2ML IJ SOLN
INTRAMUSCULAR | Status: AC
  Administered 2019-10-13: 50 ug via INTRAVENOUS
  Filled 2019-10-13: qty 2

## 2019-10-13 MED ORDER — ROCURONIUM BROMIDE 10 MG/ML (PF) SYRINGE
PREFILLED_SYRINGE | INTRAVENOUS | Status: AC
  Filled 2019-10-13: qty 10

## 2019-10-13 MED ORDER — ONDANSETRON HCL 4 MG/2ML IJ SOLN: 4.0000 mg | Freq: Four times a day (QID) | INTRAMUSCULAR | Status: DC | PRN

## 2019-10-13 MED ORDER — 0.9 % SODIUM CHLORIDE (POUR BTL) OPTIME
TOPICAL | Status: DC | PRN
  Administered 2019-10-13: 1000 mL

## 2019-10-13 MED ORDER — MUPIROCIN 2 % EX OINT: 1.0000 "application " | TOPICAL_OINTMENT | Freq: Once | CUTANEOUS | Status: AC

## 2019-10-13 SURGICAL SUPPLY — 79 items
BANDAGE ESMARK 6X9 LF (GAUZE/BANDAGES/DRESSINGS) IMPLANT
BIT DRILL CALIBRATED 2.7 (BIT) ×2 IMPLANT
BIT DRILL CALIBRATED 2.7MM (BIT) ×1
BLADE SURG 10 STRL SS (BLADE) ×3 IMPLANT
BNDG COHESIVE 4X5 TAN STRL (GAUZE/BANDAGES/DRESSINGS) IMPLANT
BNDG ELASTIC 4X5.8 VLCR STR LF (GAUZE/BANDAGES/DRESSINGS) ×3 IMPLANT
BNDG ELASTIC 6X5.8 VLCR STR LF (GAUZE/BANDAGES/DRESSINGS) ×3 IMPLANT
BNDG ESMARK 6X9 LF (GAUZE/BANDAGES/DRESSINGS)
BNDG GAUZE ELAST 4 BULKY (GAUZE/BANDAGES/DRESSINGS) ×3 IMPLANT
BONE CANC CHIPS 20CC PCAN1/4 (Bone Implant) ×3 IMPLANT
BRUSH SCRUB EZ PLAIN DRY (MISCELLANEOUS) ×6 IMPLANT
CHIPS CANC BONE 20CC PCAN1/4 (Bone Implant) ×1 IMPLANT
COVER MAYO STAND STRL (DRAPES) IMPLANT
COVER SURGICAL LIGHT HANDLE (MISCELLANEOUS) ×3 IMPLANT
COVER WAND RF STERILE (DRAPES) IMPLANT
DRAIN TLS ROUND 10FR (DRAIN) IMPLANT
DRAPE C-ARM 42X72 X-RAY (DRAPES) ×3 IMPLANT
DRAPE C-ARMOR (DRAPES) ×3 IMPLANT
DRAPE ORTHO SPLIT 77X108 STRL (DRAPES) ×2
DRAPE SURG ORHT 6 SPLT 77X108 (DRAPES) ×1 IMPLANT
DRAPE U-SHAPE 47X51 STRL (DRAPES) ×3 IMPLANT
DRSG EMULSION OIL 3X3 NADH (GAUZE/BANDAGES/DRESSINGS) IMPLANT
DRSG MEPITEL 4X7.2 (GAUZE/BANDAGES/DRESSINGS) ×3 IMPLANT
DRSG PAD ABDOMINAL 8X10 ST (GAUZE/BANDAGES/DRESSINGS) ×3 IMPLANT
ELECT REM PT RETURN 9FT ADLT (ELECTROSURGICAL) ×3
ELECTRODE REM PT RTRN 9FT ADLT (ELECTROSURGICAL) ×1 IMPLANT
GAUZE SPONGE 4X4 12PLY STRL (GAUZE/BANDAGES/DRESSINGS) ×3 IMPLANT
GLOVE BIO SURGEON STRL SZ7.5 (GLOVE) ×3 IMPLANT
GLOVE BIO SURGEON STRL SZ8 (GLOVE) ×6 IMPLANT
GLOVE BIOGEL PI IND STRL 7.5 (GLOVE) ×1 IMPLANT
GLOVE BIOGEL PI INDICATOR 7.5 (GLOVE) ×2
GOWN STRL REUS W/ TWL LRG LVL3 (GOWN DISPOSABLE) ×2 IMPLANT
GOWN STRL REUS W/ TWL XL LVL3 (GOWN DISPOSABLE) ×1 IMPLANT
GOWN STRL REUS W/TWL LRG LVL3 (GOWN DISPOSABLE) ×4
GOWN STRL REUS W/TWL XL LVL3 (GOWN DISPOSABLE) ×2
K-WIRE ACE 1.6X6 (WIRE) ×12
KIT BASIN OR (CUSTOM PROCEDURE TRAY) ×3 IMPLANT
KIT TURNOVER KIT B (KITS) ×3 IMPLANT
KWIRE ACE 1.6X6 (WIRE) ×4 IMPLANT
MANIFOLD NEPTUNE II (INSTRUMENTS) IMPLANT
NEEDLE HYPO 21X1.5 SAFETY (NEEDLE) IMPLANT
NS IRRIG 1000ML POUR BTL (IV SOLUTION) ×3 IMPLANT
PACK ORTHO EXTREMITY (CUSTOM PROCEDURE TRAY) ×3 IMPLANT
PAD ARMBOARD 7.5X6 YLW CONV (MISCELLANEOUS) ×6 IMPLANT
PAD CAST 4YDX4 CTTN HI CHSV (CAST SUPPLIES) ×1 IMPLANT
PADDING CAST ABS 4INX4YD NS (CAST SUPPLIES) ×2
PADDING CAST ABS 6INX4YD NS (CAST SUPPLIES) ×2
PADDING CAST ABS COTTON 4X4 ST (CAST SUPPLIES) ×1 IMPLANT
PADDING CAST ABS COTTON 6X4 NS (CAST SUPPLIES) ×1 IMPLANT
PADDING CAST COTTON 4X4 STRL (CAST SUPPLIES) ×2
PADDING CAST COTTON 6X4 STRL (CAST SUPPLIES) ×3 IMPLANT
PIN SHANTZ 5MM (PIN) ×3 IMPLANT
PLATE LOCK CALC MIS 2H LG RT (Plate) ×3 IMPLANT
SCREW LOCK CORT STAR 3.5X28 (Screw) ×6 IMPLANT
SCREW LOCK CORT STAR 3.5X36 (Screw) ×6 IMPLANT
SCREW LOCK CORT STAR 3.5X40 (Screw) ×3 IMPLANT
SCREW LOW PROFILE 3.5X30MM TIS (Screw) ×3 IMPLANT
SCREW SHANZ 4.0X60MM (EXFIX) IMPLANT
SCREW T15 LP CORT 3.5X36MM NS (Screw) ×3 IMPLANT
SCREW T15 LP CORT 3.5X42MM NS (Screw) ×3 IMPLANT
SPLINT PLASTER CAST XFAST 5X30 (CAST SUPPLIES) ×1 IMPLANT
SPLINT PLASTER XFAST SET 5X30 (CAST SUPPLIES) ×2
SPONGE LAP 18X18 RF (DISPOSABLE) ×3 IMPLANT
SUCTION FRAZIER HANDLE 10FR (MISCELLANEOUS) ×2
SUCTION TUBE FRAZIER 10FR DISP (MISCELLANEOUS) ×1 IMPLANT
SUT ETHILON 3 0 PS 1 (SUTURE) ×6 IMPLANT
SUT VIC AB 0 CT1 27 (SUTURE) ×2
SUT VIC AB 0 CT1 27XBRD ANBCTR (SUTURE) ×1 IMPLANT
SUT VIC AB 2-0 CT3 27 (SUTURE) ×3 IMPLANT
SUT VIC AB 2-0 SH 18 (SUTURE) IMPLANT
SUT VIC AB 3-0 FS2 27 (SUTURE) IMPLANT
SYR 5ML LL (SYRINGE) ×6 IMPLANT
SYR CONTROL 10ML LL (SYRINGE) IMPLANT
TOWEL GREEN STERILE (TOWEL DISPOSABLE) ×3 IMPLANT
TOWEL GREEN STERILE FF (TOWEL DISPOSABLE) ×3 IMPLANT
TUBE CONNECTING 12'X1/4 (SUCTIONS) ×1
TUBE CONNECTING 12X1/4 (SUCTIONS) ×2 IMPLANT
UNDERPAD 30X36 HEAVY ABSORB (UNDERPADS AND DIAPERS) ×3 IMPLANT
WATER STERILE IRR 1000ML POUR (IV SOLUTION) IMPLANT

## 2019-10-13 NOTE — Transfer of Care (Signed)
Immediate Anesthesia Transfer of Care Note  Patient: Mario Wyatt  Procedure(s) Performed: OPEN REDUCTION INTERNAL FIXATION (ORIF) CALCANEOUS FRACTURE (Right Foot)  Patient Location: PACU  Anesthesia Type:General and Regional  Level of Consciousness: awake, alert  and oriented  Airway & Oxygen Therapy: Patient Spontanous Breathing  Post-op Assessment: Report given to RN and Post -op Vital signs reviewed and stable  Post vital signs: Reviewed and stable  Last Vitals:  Vitals Value Taken Time  BP 116/94 10/13/19 1730  Temp 36.7 C 10/13/19 1730  Pulse 90 10/13/19 1735  Resp 16 10/13/19 1735  SpO2 100 % 10/13/19 1735  Vitals shown include unvalidated device data.  Last Pain:  Vitals:   10/13/19 1730  TempSrc:   PainSc: (P) 0-No pain         Complications: No complications documented.

## 2019-10-13 NOTE — Anesthesia Procedure Notes (Addendum)
Anesthesia Regional Block: Popliteal block   Pre-Anesthetic Checklist: ,, timeout performed, Correct Patient, Correct Site, Correct Laterality, Correct Procedure, Correct Position, site marked, Risks and benefits discussed,  Surgical consent,  Pre-op evaluation,  At surgeon's request and post-op pain management  Laterality: Right  Prep: chloraprep       Needles:  Injection technique: Single-shot  Needle Type: Echogenic Stimulator Needle          Additional Needles:   Narrative:  Start time: 10/14/2019 2:10 PM End time: 10/14/2019 2:15 PM Injection made incrementally with aspirations every 5 mL.  Performed by: Personally  Anesthesiologist: Mellody Dance, MD  Additional Notes: A functioning IV was confirmed and monitors were applied.  Sterile prep and drape, hand hygiene and sterile gloves were used.  Negative aspiration and test dose prior to incremental administration of local anesthetic. The patient tolerated the procedure well.Ultrasound  guidance: relevant anatomy identified, needle position confirmed, local anesthetic spread visualized around nerve(s), vascular puncture avoided.  Image printed for medical record.

## 2019-10-13 NOTE — Op Note (Signed)
10/13/2019  6:51 PM  PATIENT:  Mario Wyatt  29 y.o. male  PRE-OPERATIVE DIAGNOSIS:  FRACTURE RIGHT CALCANEUS  POST-OPERATIVE DIAGNOSIS:  FRACTURE RIGHT CALCANEUS  PROCEDURE:  Procedure(s): OPEN REDUCTION INTERNAL FIXATION (ORIF) CALCANEOUS FRACTURE (Right)  with Biomet Wave Plate  SURGEON:  Surgeon(s) and Role:    Myrene Galas, MD - Primary  PHYSICIAN ASSISTANT: Montez Morita, PA-C  ANESTHESIA:   general  EBL:  150 mL   BLOOD ADMINISTERED:none  DRAINS: none   LOCAL MEDICATIONS USED:  NONE  SPECIMEN:  No Specimen  DISPOSITION OF SPECIMEN:  N/A  COUNTS:  YES  TOURNIQUET:  * No tourniquets in log *  DICTATION: .Note written in EPIC  PLAN OF CARE: Admit to inpatient   PATIENT DISPOSITION:  PACU - hemodynamically stable.   Delay start of Pharmacological VTE agent (>24hrs) due to surgical blood loss or risk of bleeding: no  BRIEF SUMMARY OF INDICATION FOR PROCEDURE:  Mario Wyatt is a 29 y.o., who sustained a calcaneus fracture with articular depression, comminution, loss of height, lateral wall blowout, and varus. I discussed the risks and benefits of surgical treatment including the potential for nerve injury, vessel injury, DVT, PE, and subtalar joint injury that could require acute or delayed arthrodesis and result in loss of motion. We also specifically increased risk of wound complications that could lead to deep infection and amputation. The patient acknowledged these risks and did wish to proceed.  BRIEF SUMMARY OF PROCEDURE:  Patient was given a preoperative block and antibiotics, then taken to the operating room where general anesthesia was induced. A tourniquet was placed about the thigh. The lower extremity was prepped with chlorhexidine wash, then betadine scrub and paint, and draped in usual sterile fashion.   After time-out, incision was marked.  Leg elevated and exsanguinated with an Esmarch bandage.  Tourniquet inflated to 300 mmHg.  A sinus  tarsi approach to the calcaneus was then undertaken using a #10 blade initially and then a #15 blade with meticulous attention to avoid any trauma to the soft tissue flap, the peroneal tendons, or the sural nerve. Periosteal elevation allowed exposure of the anterior process and cleared sufficient space for plate placement along the tuberosity.  The subtalar joint was inspected and noted to have significant depression. The sustentacular fragment was cleaned of hematoma and then the adjacent fragments involving the subtalar joint mobilized, cleaned of hematoma, elevated into appropriate position and fixed with a K-wire. Using a Schanz pin , my assistant then manipulated the tuberosity with traction and eversion to restore height and valgus. The angle of Gissane was then reduced and held with an additional K-wire. My assistant produced valgus force and then fixation was placed into the tuberosity and anterior process through the plate. All screws were checked for position and length. Final films, lateral and Harris showed appropriate reduction, hardware placement, trajectory, and length and restoration of hindfoot valgus with what appeared to be an anatomic subtalar joint as well.  Wound was irrigated thoroughly.  Pressure held while tourniquet deflated.  Hemostasis obtained with electrocautery and then layered closure continued with 2-0 Vicryl for the deep periosteal layer.  Additional 2-0 Vicryl for the subcutaneous layer and 3-0 nylon for the skin.  Sterile gently compressive dressing and posterior and stirrup splint were applied.  The patient was then taken to PACU in stable condition.  Montez Morita, PA-C, did assist throughout and his assistance was absolutely necessary in this complex and difficult case for obtaining reduction, maintaining  a provisional fixation, and definitive fixation as well as closure.  PROGNOSIS:  Patient will be nonweightbearing for the next 6 weeks with graduated weight bearing in a boot  thereafter.  Return to the office in 10 days, at which time, I will anticipate initiation of motion with removal of sutures around the 2-3 week mark.  Ice, elevate, have Lovenox while in the hospital.  Discharged on Lovenox or aspirin if finances necessitate.     Doralee Albino. Carola Frost, M.D.
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
## 2019-10-13 NOTE — Anesthesia Procedure Notes (Signed)
Procedure Name: Intubation Date/Time: 10/13/2019 2:46 PM Performed by: Clearnce Sorrel, CRNA Pre-anesthesia Checklist: Patient identified, Emergency Drugs available, Suction available, Patient being monitored and Timeout performed Patient Re-evaluated:Patient Re-evaluated prior to induction Oxygen Delivery Method: Circle system utilized Preoxygenation: Pre-oxygenation with 100% oxygen Induction Type: IV induction Ventilation: Mask ventilation without difficulty Laryngoscope Size: Mac and 4 Grade View: Grade I Tube size: 7.5 mm Number of attempts: 1 Airway Equipment and Method: Stylet Placement Confirmation: ETT inserted through vocal cords under direct vision,  breath sounds checked- equal and bilateral and positive ETCO2 Secured at: 23 cm Tube secured with: Tape Dental Injury: Teeth and Oropharynx as per pre-operative assessment

## 2019-10-14 ENCOUNTER — Encounter (HOSPITAL_COMMUNITY): Payer: Self-pay | Admitting: Orthopedic Surgery

## 2019-10-14 DIAGNOSIS — E559 Vitamin D deficiency, unspecified: Secondary | ICD-10-CM

## 2019-10-14 DIAGNOSIS — Z91041 Radiographic dye allergy status: Secondary | ICD-10-CM | POA: Diagnosis not present

## 2019-10-14 DIAGNOSIS — X501XXA Overexertion from prolonged static or awkward postures, initial encounter: Secondary | ICD-10-CM | POA: Diagnosis not present

## 2019-10-14 DIAGNOSIS — Z833 Family history of diabetes mellitus: Secondary | ICD-10-CM | POA: Diagnosis not present

## 2019-10-14 DIAGNOSIS — Z91013 Allergy to seafood: Secondary | ICD-10-CM | POA: Diagnosis not present

## 2019-10-14 DIAGNOSIS — K219 Gastro-esophageal reflux disease without esophagitis: Secondary | ICD-10-CM | POA: Diagnosis present

## 2019-10-14 DIAGNOSIS — F319 Bipolar disorder, unspecified: Secondary | ICD-10-CM | POA: Diagnosis present

## 2019-10-14 DIAGNOSIS — F209 Schizophrenia, unspecified: Secondary | ICD-10-CM | POA: Diagnosis present

## 2019-10-14 DIAGNOSIS — Z91040 Latex allergy status: Secondary | ICD-10-CM | POA: Diagnosis not present

## 2019-10-14 DIAGNOSIS — S92061A Displaced intraarticular fracture of right calcaneus, initial encounter for closed fracture: Secondary | ICD-10-CM | POA: Diagnosis present

## 2019-10-14 DIAGNOSIS — Z813 Family history of other psychoactive substance abuse and dependence: Secondary | ICD-10-CM | POA: Diagnosis not present

## 2019-10-14 DIAGNOSIS — Z8249 Family history of ischemic heart disease and other diseases of the circulatory system: Secondary | ICD-10-CM | POA: Diagnosis not present

## 2019-10-14 DIAGNOSIS — F17210 Nicotine dependence, cigarettes, uncomplicated: Secondary | ICD-10-CM | POA: Diagnosis present

## 2019-10-14 DIAGNOSIS — Z83511 Family history of glaucoma: Secondary | ICD-10-CM | POA: Diagnosis not present

## 2019-10-14 DIAGNOSIS — I429 Cardiomyopathy, unspecified: Secondary | ICD-10-CM | POA: Diagnosis present

## 2019-10-14 DIAGNOSIS — I5022 Chronic systolic (congestive) heart failure: Secondary | ICD-10-CM | POA: Diagnosis present

## 2019-10-14 DIAGNOSIS — Q246 Congenital heart block: Secondary | ICD-10-CM | POA: Diagnosis not present

## 2019-10-14 DIAGNOSIS — F419 Anxiety disorder, unspecified: Secondary | ICD-10-CM | POA: Diagnosis present

## 2019-10-14 DIAGNOSIS — Z20822 Contact with and (suspected) exposure to covid-19: Secondary | ICD-10-CM | POA: Diagnosis present

## 2019-10-14 DIAGNOSIS — Z950 Presence of cardiac pacemaker: Secondary | ICD-10-CM | POA: Diagnosis not present

## 2019-10-14 DIAGNOSIS — I110 Hypertensive heart disease with heart failure: Secondary | ICD-10-CM | POA: Diagnosis present

## 2019-10-14 DIAGNOSIS — D62 Acute posthemorrhagic anemia: Secondary | ICD-10-CM | POA: Diagnosis not present

## 2019-10-14 DIAGNOSIS — Y92039 Unspecified place in apartment as the place of occurrence of the external cause: Secondary | ICD-10-CM | POA: Diagnosis not present

## 2019-10-14 DIAGNOSIS — Z818 Family history of other mental and behavioral disorders: Secondary | ICD-10-CM | POA: Diagnosis not present

## 2019-10-14 DIAGNOSIS — F17200 Nicotine dependence, unspecified, uncomplicated: Secondary | ICD-10-CM | POA: Diagnosis present

## 2019-10-14 DIAGNOSIS — F909 Attention-deficit hyperactivity disorder, unspecified type: Secondary | ICD-10-CM | POA: Diagnosis present

## 2019-10-14 DIAGNOSIS — Z79899 Other long term (current) drug therapy: Secondary | ICD-10-CM | POA: Diagnosis not present

## 2019-10-14 HISTORY — DX: Nicotine dependence, unspecified, uncomplicated: F17.200

## 2019-10-14 HISTORY — DX: Vitamin D deficiency, unspecified: E55.9

## 2019-10-14 LAB — COMPREHENSIVE METABOLIC PANEL
ALT: 12 U/L (ref 0–44)
AST: 19 U/L (ref 15–41)
Albumin: 3.7 g/dL (ref 3.5–5.0)
Alkaline Phosphatase: 53 U/L (ref 38–126)
Anion gap: 7 (ref 5–15)
BUN: 14 mg/dL (ref 6–20)
CO2: 26 mmol/L (ref 22–32)
Calcium: 9.1 mg/dL (ref 8.9–10.3)
Chloride: 105 mmol/L (ref 98–111)
Creatinine, Ser: 1.1 mg/dL (ref 0.61–1.24)
GFR, Estimated: >60 mL/min (ref >60)
Glucose, Bld: 144 mg/dL — ABNORMAL HIGH (ref 70–99)
Potassium: 4.7 mmol/L (ref 3.5–5.1)
Sodium: 138 mmol/L (ref 135–145)
Total Bilirubin: 0.7 mg/dL (ref 0.3–1.2)
Total Protein: 6 g/dL — ABNORMAL LOW (ref 6.5–8.1)

## 2019-10-14 MED ORDER — ASCORBIC ACID 500 MG PO TABS
1000.0000 mg | ORAL_TABLET | Freq: Every day | ORAL | Status: DC
  Administered 2019-10-14 – 2019-10-16 (×3): 1000 mg via ORAL
  Filled 2019-10-14 (×3): qty 2

## 2019-10-14 MED ORDER — VITAMIN D 25 MCG (1000 UNIT) PO TABS
2000.0000 [IU] | ORAL_TABLET | Freq: Two times a day (BID) | ORAL | Status: DC
  Administered 2019-10-14 – 2019-10-16 (×5): 2000 [IU] via ORAL
  Filled 2019-10-14 (×5): qty 2

## 2019-10-14 NOTE — Plan of Care (Signed)

## 2019-10-14 NOTE — Progress Notes (Signed)
Orthopaedic Trauma Service Progress Note  Patient ID: Mario Wyatt MRN: [id number] DOB/AGE: 30-29-1992 30 y.o.  Subjective:  Doing ok  Block still working No motor or sensory functions at this time  Lives alone Lives in an apartment, first floor    ROS As above  Objective:   VITALS:   Vitals:   10/13/19 1900 10/13/19 2300 10/14/19 0500 10/14/19 0750  BP: 100/76 110/78 105/61 (!) 101/56  Pulse: 72 69 60 (!) 59  Resp: 15   20  Temp: 98 F (36.7 C) 97.8 F (36.6 C) 97.6 F (36.4 C) 98.1 F (36.7 C)  TempSrc: Oral Oral Oral Oral  SpO2: 98% 99% 97% 97%    Estimated body mass index is 22.04 kg/m as calculated from the following:   Height as of 05/12/19: 5\' 3"  (1.6 m).   Weight as of 05/12/19: 56.4 kg.   Intake/Output      10/12 0701 - 10/13 0700 10/13 0701 - 10/14 0700   P.O. 240    I.V. 1116.1    IV Piggyback 550    Total Intake 1906.1    Blood 150    Total Output 150    Net +1756.1           LABS  Results for orders placed or performed during the hospital encounter of 10/13/19 (from the past 24 hour(s))  SARS Coronavirus 2 by RT PCR (hospital order, performed in [facility] hospital lab) Nasopharyngeal Nasopharyngeal Swab     Status: None   Collection Time: 10/13/19 12:03 PM   Specimen: Nasopharyngeal Swab  Result Value Ref Range   SARS Coronavirus 2 NEGATIVE NEGATIVE  CBC     Status: None   Collection Time: 10/13/19  1:27 PM  Result Value Ref Range   WBC 6.3 4.0 - 10.5 K/uL   RBC 5.11 4.22 - 5.81 MIL/uL   Hemoglobin 16.1 13.0 - 17.0 g/dL   HCT 12/13/19 39 - 52 %   MCV 93.2 80.0 - 100.0 fL   MCH 31.5 26.0 - 34.0 pg   MCHC 33.8 30.0 - 36.0 g/dL   RDW 35.4 65.6 - 81.2 %   Platelets 279 150 - 400 K/uL   nRBC 0.0 0.0 - 0.2 %  Comprehensive metabolic panel     Status: None   Collection Time: 10/13/19  1:27 PM  Result Value Ref Range   Sodium 138 135 - 145 mmol/L    Potassium 3.9 3.5 - 5.1 mmol/L   Chloride 103 98 - 111 mmol/L   CO2 25 22 - 32 mmol/L   Glucose, Bld 85 70 - 99 mg/dL   BUN 9 6 - 20 mg/dL   Creatinine, Ser 12/13/19 0.61 - 1.24 mg/dL   Calcium 9.4 8.9 - 7.00 mg/dL   Total Protein 7.1 6.5 - 8.1 g/dL   Albumin 4.3 3.5 - 5.0 g/dL   AST 23 15 - 41 U/L   ALT 18 0 - 44 U/L   Alkaline Phosphatase 64 38 - 126 U/L   Total Bilirubin 0.8 0.3 - 1.2 mg/dL   GFR, Estimated 17.4 >94 mL/min   Anion gap 10 5 - 15  VITAMIN D 25 Hydroxy (Vit-D Deficiency, Fractures)     Status: Abnormal   Collection Time: 10/13/19  1:27 PM  Result Value Ref Range   Vit  D, 25-Hydroxy 19.54 (L) 30 - 100 ng/mL  Surgical pcr screen     Status: None   Collection Time: 10/13/19  1:48 PM   Specimen: Nasal Mucosa; Nasal Swab  Result Value Ref Range   MRSA, PCR NEGATIVE NEGATIVE   Staphylococcus aureus NEGATIVE NEGATIVE  Comprehensive metabolic panel     Status: Abnormal   Collection Time: 10/14/19  2:30 AM  Result Value Ref Range   Sodium 138 135 - 145 mmol/L   Potassium 4.7 3.5 - 5.1 mmol/L   Chloride 105 98 - 111 mmol/L   CO2 26 22 - 32 mmol/L   Glucose, Bld 144 (H) 70 - 99 mg/dL   BUN 14 6 - 20 mg/dL   Creatinine, Ser 6.19 0.61 - 1.24 mg/dL   Calcium 9.1 8.9 - 50.9 mg/dL   Total Protein 6.0 (L) 6.5 - 8.1 g/dL   Albumin 3.7 3.5 - 5.0 g/dL   AST 19 15 - 41 U/L   ALT 12 0 - 44 U/L   Alkaline Phosphatase 53 38 - 126 U/L   Total Bilirubin 0.7 0.3 - 1.2 mg/dL   GFR, Estimated >32 >67 mL/min   Anion gap 7 5 - 15     PHYSICAL EXAM:   Gen: sleeping on right side, easily arousable, NAD, appears well  Lungs: unlabored Cardiac: s1 and s2 Ext:      Right Lower Extremity   SLS fitting well  No motor or sensory functions noted at this time  Minimal swelling    Ext warm   Well perfused   Assessment/Plan: 1 Day Post-Op   Principal Problem:   Closed displaced intraarticular fracture of right calcaneus Active Problems:   Pacemaker   Anxiety   Attention deficit  hyperactivity disorder (ADHD)   Congenital complete AV block   GERD (gastroesophageal reflux disease)   Nicotine dependence   Vitamin D deficiency   Anti-infectives (From admission, onward)   Start     Dose/Rate Route Frequency Ordered Stop   10/14/19 0600  ceFAZolin (ANCEF) IVPB 2g/100 mL premix        2 g 200 mL/hr over 30 Minutes Intravenous On call to O.R. 10/13/19 1229 10/13/19 1448   10/13/19 2100  ceFAZolin (ANCEF) IVPB 1 g/50 mL premix        1 g 100 mL/hr over 30 Minutes Intravenous Every 6 hours 10/13/19 1846 10/14/19 0926    .  POD/HD#: 1  29 y/o male with closed right calcaneus fracture   -comminuted closed Right intra-articular calcaneus fracture s/p ORIF    NWB x 8 weeks  Ice and elevate as much as possible   Foot above heart  PT/OT evals  Splint x 2 weeks then CAM to begin ROM    - Pain management:  Block still working   Scheduled tylenol ordered  Instructed pt to start asking for meds as soon as he starts to feel anything in his right foot  - ABL anemia/Hemodynamics  Stable  - Medical issues   Home meds  - DVT/PE prophylaxis:  Lovenox as inpt  ASA at dc   - ID:   periop abx  - Metabolic Bone Disease:  Vitamin d deficiency    Supplement   Optimize nutrition   - Activity:  As above  - FEN/GI prophylaxis/Foley/Lines:  Reg diet  NSL   -Ex-fix/Splint care:  Keep splint clean and dry   Do not get splint wet  Do not remove splint   - Impediments to fracture healing:  Nicotine use  Vitamin d deficiency   - Dispo:  Therapy evals  Possible dc home tomorrow or Friday   Will need a ride home    Mearl Latin, PA-C [phone] (C) 10/14/2019, 9:51 AM  [facility] [address] [place] [address] [phone] Collier Bullock (F)    After 5pm and on the weekends please log on to Amion, go to orthopaedics and the look under the [facility] Call for the provider(s) on call. You can also call our  office at [phone] and then follow the prompts to be connected to the call team.

## 2019-10-14 NOTE — Anesthesia Postprocedure Evaluation (Signed)
Anesthesia Post Note  Patient: Mario Wyatt  Procedure(s) Performed: OPEN REDUCTION INTERNAL FIXATION (ORIF) CALCANEOUS FRACTURE (Right Foot)     Patient location during evaluation: PACU Anesthesia Type: General and Regional Level of consciousness: sedated Pain management: pain level controlled Vital Signs Assessment: post-procedure vital signs reviewed and stable Respiratory status: spontaneous breathing and respiratory function stable Cardiovascular status: stable Postop Assessment: no apparent nausea or vomiting Anesthetic complications: no   No complications documented.  Last Vitals:  Vitals:   10/14/19 0500 10/14/19 0750  BP: 105/61 (!) 101/56  Pulse: 60 (!) 59  Resp:  20  Temp: 36.4 C 36.7 C  SpO2: 97% 97%    Last Pain:  Vitals:   10/14/19 1000  TempSrc:   PainSc: 2                  Mellody Dance
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
## 2019-10-15 NOTE — Plan of Care (Signed)

## 2019-10-15 NOTE — Progress Notes (Signed)
Orthopaedic Trauma Service Progress Note  Patient ID: Mario Wyatt MRN: [id number] DOB/AGE: 1990-06-16 29 y.o.  Subjective:  Sleeping No actue issues Therapy notes reviewed   ROS As above  Objective:   VITALS:   Vitals:   10/14/19 2105 10/15/19 0500 10/15/19 0700 10/15/19 1500  BP: 99/77 117/69 119/72 121/75  Pulse: 66 79 77 75  Resp: 16 17 18 18   Temp: 97.6 F (36.4 C) 97.8 F (36.6 C) 98 F (36.7 C) 98 F (36.7 C)  TempSrc: Oral Oral Oral Oral  SpO2: 100% 98% 99% 100%    Estimated body mass index is 22.04 kg/m as calculated from the following:   Height as of 05/12/19: 5\' 3"  (1.6 m).   Weight as of 05/12/19: 56.4 kg.   Intake/Output      10/13 0701 - 10/14 0700 10/14 0701 - 10/15 0700   P.O. 920    I.V.     IV Piggyback     Total Intake 920    Urine 550    Blood     Total Output 550    Net +370         Urine Occurrence  2 x     LABS  No results found for this or any previous visit (from the past 24 hour(s)).   PHYSICAL EXAM:   Gen: sleeping, NAD Lungs: unlabored Ext:       Right lower extremity   Dressing/splint c/d/i  Ext warm   No acute changes in exam   Assessment/Plan: 2 Days Post-Op   Principal Problem:   Closed displaced intraarticular fracture of right calcaneus Active Problems:   Pacemaker   Anxiety   Attention deficit hyperactivity disorder (ADHD)   Congenital complete AV block   GERD (gastroesophageal reflux disease)   Nicotine dependence   Vitamin D deficiency   Anti-infectives (From admission, onward)   Start     Dose/Rate Route Frequency Ordered Stop   10/14/19 0600  ceFAZolin (ANCEF) IVPB 2g/100 mL premix        2 g 200 mL/hr over 30 Minutes Intravenous On call to O.R. 10/13/19 1229 10/13/19 1448   10/13/19 2100  ceFAZolin (ANCEF) IVPB 1 g/50 mL premix        1 g 100 mL/hr over 30 Minutes Intravenous Every 6 hours 10/13/19 1846  10/14/19 0926    .  POD/HD#: 2  29 y/o male with closed right calcaneus fracture    -comminuted closed Right intra-articular calcaneus fracture s/p ORIF                      NWB x 8 weeks             Ice and elevate as much as possible                         Foot above heart             PT/OT evals             Splint x 2 weeks then CAM to begin ROM      - Pain management:  Multimodal               - ABL anemia/Hemodynamics  Stable   - Medical issues              Home meds   - DVT/PE prophylaxis:             Lovenox as inpt             ASA at dc    - ID:              periop abx completed    - Metabolic Bone Disease:             Vitamin d deficiency                          Supplement              Optimize nutrition    - Activity:             As above   - FEN/GI prophylaxis/Foley/Lines:             Reg diet             NSL    -Ex-fix/Splint care:             Keep splint clean and dry              Do not get splint wet             Do not remove splint    - Impediments to fracture healing:             Nicotine use             Vitamin d deficiency    - Dispo:             anticipate dc home tomorrow  Will need a ride home as he can't drive with his right leg splinted      Mearl Latin, PA-C [phone] (C) 10/15/2019, 5:45 PM  [facility] [address] [place] [address] [phone] (F)    After 5pm and on the weekends please log on to Amion, go to orthopaedics and the look under the [facility] Call for the provider(s) on call. You can also call our office at [phone] and then follow the prompts to be connected to the call team.

## 2019-10-15 NOTE — Plan of Care (Signed)

## 2019-10-16 ENCOUNTER — Other Ambulatory Visit (HOSPITAL_COMMUNITY): Payer: Self-pay | Admitting: Orthopedic Surgery

## 2019-10-16 MED ORDER — OXYCODONE-ACETAMINOPHEN 7.5-325 MG PO TABS: 1.0000 | ORAL_TABLET | Freq: Three times a day (TID) | ORAL | 0 refills | Status: DC | PRN

## 2019-10-16 MED ORDER — ASCORBIC ACID 1000 MG PO TABS: 1000.0000 mg | ORAL_TABLET | Freq: Every day | ORAL | 1 refills | Status: AC

## 2019-10-16 MED ORDER — ASPIRIN EC 325 MG PO TBEC: 325.0000 mg | DELAYED_RELEASE_TABLET | Freq: Every day | ORAL | 0 refills | Status: DC

## 2019-10-16 MED ORDER — VITAMIN D 125 MCG (5000 UT) PO CAPS: 1.0000 | ORAL_CAPSULE | Freq: Every day | ORAL | 6 refills | Status: DC

## 2019-10-16 MED ORDER — ASCORBIC ACID 1000 MG PO TABS: 1000.0000 mg | ORAL_TABLET | Freq: Every day | ORAL | 1 refills | Status: DC

## 2019-10-16 MED ORDER — DOCUSATE SODIUM 100 MG PO CAPS: 100.0000 mg | ORAL_CAPSULE | Freq: Two times a day (BID) | ORAL | 0 refills | Status: DC

## 2019-10-16 MED ORDER — METHOCARBAMOL 500 MG PO TABS: 500.0000 mg | ORAL_TABLET | Freq: Four times a day (QID) | ORAL | 0 refills | Status: DC | PRN

## 2019-10-16 MED ORDER — DOCUSATE SODIUM 100 MG PO CAPS
100.0000 mg | ORAL_CAPSULE | Freq: Two times a day (BID) | ORAL | 0 refills | Status: DC
Start: 2019-10-16 — End: 2019-10-16

## 2019-10-16 MED FILL — VITAMIN C 500 MG TABLET: 500 | 30 days supply | Qty: 60 | Fill #0

## 2019-10-16 MED FILL — ASPIRIN EC 325 MG TABLET: 325 | 30 days supply | Qty: 30 | Fill #0

## 2019-10-16 MED FILL — VITAMIN D3 5,000 UNIT TAB: 125 MCG | 30 days supply | Qty: 30 | Fill #0

## 2019-10-16 MED FILL — DOCUSATE SODIUM 100 MG CAPS: 100 | 10 days supply | Qty: 20 | Fill #0

## 2019-10-16 MED FILL — OXYCODONE-APAP 7.5-325MG: 7.5-325 | 6 days supply | Qty: 40 | Fill #0

## 2019-10-16 MED FILL — METHOCARBAMOL 500 MG TABS: 500 | 7 days supply | Qty: 60 | Fill #0

## 2019-10-16 NOTE — Plan of Care (Signed)

## 2019-10-16 NOTE — Progress Notes (Signed)
D/c'd instruction given pt acknowledge, meds delivered at pt bedside, IV d/c'd. Patient escorted downstairs by staff in no acute distress.

## 2019-10-16 NOTE — Progress Notes (Signed)
Orthopaedic Trauma Service Progress Note  Patient ID: Mario Wyatt MRN: [id number] DOB/AGE: 1990/06/11 29 y.o.  Subjective:  Doing well Ready to go home  Block as worn off Pain tolerable   ROS As above  Objective:   VITALS:   Vitals:   10/15/19 1500 10/15/19 1956 10/16/19 0520 10/16/19 0807  BP: 121/75 116/69 114/86 118/65  Pulse: 75 63 73 80  Resp: 18 14 16    Temp: 98 F (36.7 C) 98.6 F (37 C) 98.1 F (36.7 C) 98.9 F (37.2 C)  TempSrc: Oral Oral Oral Oral  SpO2: 100% 98% 97% 99%    Estimated body mass index is 22.04 kg/m as calculated from the following:   Height as of 05/12/19: 5\' 3"  (1.6 m).   Weight as of 05/12/19: 56.4 kg.   Intake/Output      10/14 0701 - 10/15 0700 10/15 0701 - 10/16 0700   P.O. 600    Total Intake 600    Urine 500    Total Output 500    Net +100         Urine Occurrence 2 x      LABS  No results found for this or any previous visit (from the past 24 hour(s)).   PHYSICAL EXAM:   Gen: awake, NAD, appears well  Lungs: unlabored Cardiac: s1 and s2 Ext:      Right Lower Extremity              SLS fitting well             EHL, FHL, lesser toe motor intact    DPN, SPN, TN sensation intact             Minimal swelling                       Ext warm              Well perfused   No pain out of proportion with passive stretch    Assessment/Plan: 3 Days Post-Op   Principal Problem:   Closed displaced intraarticular fracture of right calcaneus Active Problems:   Pacemaker   Anxiety   Attention deficit hyperactivity disorder (ADHD)   Congenital complete AV block   GERD (gastroesophageal reflux disease)   Nicotine dependence   Vitamin D deficiency   Anti-infectives (From admission, onward)   Start     Dose/Rate Route Frequency Ordered Stop   10/14/19 0600  ceFAZolin (ANCEF) IVPB 2g/100 mL premix        2 g 200 mL/hr over 30 Minutes  Intravenous On call to O.R. 10/13/19 1229 10/13/19 1448   10/13/19 2100  ceFAZolin (ANCEF) IVPB 1 g/50 mL premix        1 g 100 mL/hr over 30 Minutes Intravenous Every 6 hours 10/13/19 1846 10/14/19 0926    .  POD/HD#: 1  29 y/o male with closed right calcaneus fracture  -comminuted closed Right intra-articular calcaneus fracture s/p ORIF  NWB x 8 weeks Ice and elevate as much as possible Foot above heart PT/OT evals Splint x 2 weeks then CAM to begin ROM   Dc home today    - Pain management:             Multimodal   - ABL anemia/Hemodynamics  Stable  - Medical issues Home meds  - DVT/PE prophylaxis: Lovenox as inpt ASA at dc   - ID: periop abx completed   - Metabolic Bone Disease: Vitamin d deficiency  Supplement  Optimize nutrition   - Activity: As above  - FEN/GI prophylaxis/Foley/Lines: Reg diet NSL   -Ex-fix/Splint care: Keep splint clean and dry  Do not get splint wet Do not remove splint   - Impediments to fracture healing: Nicotine use Vitamin d deficiency   - Dispo: dc home today   Will need a ride home   Follow up with ortho in 2 weeks   Mearl Latin, PA-C [phone] (C) 10/16/2019, 9:09 AM  [facility] [address] [place] [address] [phone] [phone] (F)    After 5pm and on the weekends please log on to Amion, go to orthopaedics and the look under the [facility] Call for the provider(s) on call. You can also call our office at [phone] and then follow the prompts to be connected to the call team.

## 2019-10-16 NOTE — Discharge Instructions (Signed)
Orthopaedic Trauma Service Discharge Instructions   General Discharge Instructions  Orthopaedic Injuries:  Right calcaneus fracture treated with open reduction and internal fixation using plate and screws   WEIGHT BEARING STATUS: Nonweightbearing Right leg. Do not remove splint  RANGE OF MOTION/ACTIVITY: do not remove splint. Ok to move toes and knee.   Bone health: labs so vitamin d deficiency. Take vitamin d supplements that have been prescribed. This important for bone healing   Wound Care: do not remove splint. Do not get splint wet. We will remove splint at follow up visit   DVT/PE prophylaxis: Aspirin 325 mg daily x 4 weeks   Diet: as you were eating previously.  Can use over the counter stool softeners and bowel preparations, such as Miralax, to help with bowel movements.  Narcotics can be constipating.  Be sure to drink plenty of fluids  PAIN MEDICATION USE AND EXPECTATIONS  You have likely been given narcotic medications to help control your pain.  After a traumatic event that results in an fracture (broken bone) with or without surgery, it is ok to use narcotic pain medications to help control one's pain.  We understand that everyone responds to pain differently and each individual patient will be evaluated on a regular basis for the continued need for narcotic medications. Ideally, narcotic medication use should last no more than 6-8 weeks (coinciding with fracture healing).   As a patient it is your responsibility as well to monitor narcotic medication use and report the amount and frequency you use these medications when you come to your office visit.   We would also advise that if you are using narcotic medications, you should take a dose prior to therapy to maximize you participation.  IF YOU ARE ON NARCOTIC MEDICATIONS IT IS NOT PERMISSIBLE TO OPERATE A MOTOR VEHICLE (MOTORCYCLE/CAR/TRUCK/MOPED) OR HEAVY MACHINERY DO NOT MIX NARCOTICS WITH OTHER CNS (CENTRAL NERVOUS  SYSTEM) DEPRESSANTS SUCH AS ALCOHOL   STOP SMOKING OR USING NICOTINE PRODUCTS!!!!  As discussed nicotine severely impairs your body's ability to heal surgical and traumatic wounds but also impairs bone healing.  Wounds and bone heal by forming microscopic blood vessels (angiogenesis) and nicotine is a vasoconstrictor (essentially, shrinks blood vessels).  Therefore, if vasoconstriction occurs to these microscopic blood vessels they essentially disappear and are unable to deliver necessary nutrients to the healing tissue.  This is one modifiable factor that you can do to dramatically increase your chances of healing your injury.    (This means no smoking, no nicotine gum, patches, etc)  DO NOT USE NONSTEROIDAL ANTI-INFLAMMATORY DRUGS (NSAID'S)  Using products such as Advil (ibuprofen), Aleve (naproxen), Motrin (ibuprofen) for additional pain control during fracture healing can delay and/or prevent the healing response.  If you would like to take over the counter (OTC) medication, Tylenol (acetaminophen) is ok.  However, some narcotic medications that are given for pain control contain acetaminophen as well. Therefore, you should not exceed more than 4000 mg of tylenol in a day if you do not have liver disease.  Also note that there are may OTC medicines, such as cold medicines and allergy medicines that my contain tylenol as well.  If you have any questions about medications and/or interactions please ask your doctor/PA or your pharmacist.      ICE AND ELEVATE INJURED/OPERATIVE EXTREMITY  Using ice and elevating the injured extremity above your heart can help with swelling and pain control.  Icing in a pulsatile fashion, such as 20 minutes on and 20  minutes off, can be followed.    Do not place ice directly on skin. Make sure there is a barrier between to skin and the ice pack.    Using frozen items such as frozen peas works well as the conform nicely to the are that needs to be iced.  USE AN ACE WRAP  OR TED HOSE FOR SWELLING CONTROL  In addition to icing and elevation, Ace wraps or TED hose are used to help limit and resolve swelling.  It is recommended to use Ace wraps or TED hose until you are informed to stop.    When using Ace Wraps start the wrapping distally (farthest away from the body) and wrap proximally (closer to the body)   Example: If you had surgery on your leg or thing and you do not have a splint on, start the ace wrap at the toes and work your way up to the thigh        If you had surgery on your upper extremity and do not have a splint on, start the ace wrap at your fingers and work your way up to the upper arm  IF YOU ARE IN A SPLINT OR CAST DO NOT REMOVE IT FOR ANY REASON   If your splint gets wet for any reason please contact the office immediately. You may shower in your splint or cast as long as you keep it dry.  This can be done by wrapping in a cast cover or garbage back (or similar)  Do Not stick any thing down your splint or cast such as pencils, money, or hangers to try and scratch yourself with.  If you feel itchy take benadryl as prescribed on the bottle for itching  IF YOU ARE IN A CAM BOOT (BLACK BOOT)  You may remove boot periodically. Perform daily dressing changes as noted below.  Wash the liner of the boot regularly and wear a sock when wearing the boot. It is recommended that you sleep in the boot until told otherwise    Call office for the following:  Temperature greater than 101F  Persistent nausea and vomiting  Severe uncontrolled pain  Redness, tenderness, or signs of infection (pain, swelling, redness, odor or green/yellow discharge around the site)  Difficulty breathing, headache or visual disturbances  Hives  Persistent dizziness or light-headedness  Extreme fatigue  Any other questions or concerns you may have after discharge  In an emergency, call 911 or go to an Emergency Department at a nearby hospital  HELPFUL  INFORMATION  ? If you had a block, it will wear off between 8-24 hrs postop typically.  This is period when your pain may go from nearly zero to the pain you would have had postop without the block.  This is an abrupt transition but nothing dangerous is happening.  You may take an extra dose of narcotic when this happens.  ? You should wean off your narcotic medicines as soon as you are able.  Most patients will be off or using minimal narcotics before their first postop appointment.   ? We suggest you use the pain medication the first night prior to going to bed, in order to ease any pain when the anesthesia wears off. You should avoid taking pain medications on an empty stomach as it will make you nauseous.  ? Do not drink alcoholic beverages or take illicit drugs when taking pain medications.  ? In most states it is against the law to drive while you  are in a splint or sling.  And certainly against the law to drive while taking narcotics.  ? You may return to work/school in the next couple of days when you feel up to it.   ? Pain medication may make you constipated.  Below are a few solutions to try in this order: - Decrease the amount of pain medication if you aren't having pain. - Drink lots of decaffeinated fluids. - Drink prune juice and/or each dried prunes  o If the first 3 don't work start with additional solutions - Take Colace - an over-the-counter stool softener - Take Senokot - an over-the-counter laxative - Take Miralax - a stronger over-the-counter laxative     CALL THE OFFICE WITH ANY QUESTIONS OR CONCERNS: [phone]   VISIT OUR WEBSITE FOR ADDITIONAL INFORMATION: [email or web address]
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
## 2019-11-28 ENCOUNTER — Other Ambulatory Visit: Payer: Self-pay

## 2019-11-28 ENCOUNTER — Emergency Department (HOSPITAL_COMMUNITY): Payer: Medicaid Other

## 2019-11-28 ENCOUNTER — Emergency Department (HOSPITAL_COMMUNITY)
Admission: EM | Admit: 2019-11-28 | Discharge: 2019-11-28 | Disposition: A | Discharge status: Home / Self Care | Payer: Medicaid Other | Attending: Emergency Medicine | Admitting: Emergency Medicine

## 2019-11-28 DIAGNOSIS — Y92410 Unspecified street and highway as the place of occurrence of the external cause: Secondary | ICD-10-CM | POA: Diagnosis not present

## 2019-11-28 DIAGNOSIS — Y9389 Activity, other specified: Secondary | ICD-10-CM | POA: Diagnosis not present

## 2019-11-28 DIAGNOSIS — S0181XA Laceration without foreign body of other part of head, initial encounter: Secondary | ICD-10-CM | POA: Insufficient documentation

## 2019-11-28 DIAGNOSIS — Z950 Presence of cardiac pacemaker: Secondary | ICD-10-CM | POA: Diagnosis not present

## 2019-11-28 DIAGNOSIS — Z7982 Long term (current) use of aspirin: Secondary | ICD-10-CM | POA: Insufficient documentation

## 2019-11-28 DIAGNOSIS — I5022 Chronic systolic (congestive) heart failure: Secondary | ICD-10-CM | POA: Diagnosis not present

## 2019-11-28 DIAGNOSIS — Z91040 Latex allergy status: Secondary | ICD-10-CM | POA: Insufficient documentation

## 2019-11-28 DIAGNOSIS — F17210 Nicotine dependence, cigarettes, uncomplicated: Secondary | ICD-10-CM | POA: Diagnosis not present

## 2019-11-28 DIAGNOSIS — S0990XA Unspecified injury of head, initial encounter: Secondary | ICD-10-CM | POA: Diagnosis present

## 2019-11-28 DIAGNOSIS — J4520 Mild intermittent asthma, uncomplicated: Secondary | ICD-10-CM | POA: Insufficient documentation

## 2019-11-28 MED ORDER — LIDOCAINE HCL (PF) 1 % IJ SOLN
5.0000 mL | Freq: Once | INTRAMUSCULAR | Status: AC
  Administered 2019-11-28: 5 mL
  Filled 2019-11-28: qty 30

## 2019-11-28 NOTE — ED Notes (Signed)
Delay in obtaining vital signs and completing triage as patient is on the phone.
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
## 2019-11-28 NOTE — ED Notes (Addendum)
Patient refuse wound care to the face laceration site and vital sign at discharged.

## 2019-11-28 NOTE — ED Notes (Signed)
Unable to get vitals at this time patient would not get off the phone

## 2019-12-31 ENCOUNTER — Ambulatory Visit (INDEPENDENT_AMBULATORY_CARE_PROVIDER_SITE_OTHER): Payer: Medicaid Other

## 2019-12-31 DIAGNOSIS — I442 Atrioventricular block, complete: Secondary | ICD-10-CM | POA: Diagnosis not present

## 2019-12-31 LAB — CUP PACEART REMOTE DEVICE CHECK
Battery Remaining Longevity: 82 mo
Battery Remaining Percentage: 95.5 %
Battery Voltage: 2.98 V
Brady Statistic AP VP Percent: 30 %
Brady Statistic AP VS Percent: 1 %
Brady Statistic AS VP Percent: 70 %
Brady Statistic AS VS Percent: 1 %
Brady Statistic RA Percent Paced: 30 %
Date Time Interrogation Session: 20211230040020
Implantable Lead Implant Date: 20160907
Implantable Lead Implant Date: 20160907
Implantable Lead Implant Date: 20190404
Implantable Lead Location: 753858
Implantable Lead Location: 753859
Implantable Lead Location: 753860
Implantable Lead Model: 1948
Implantable Pulse Generator Implant Date: 20190404
Lead Channel Impedance Value: 490 Ohm
Lead Channel Impedance Value: 600 Ohm
Lead Channel Impedance Value: 800 Ohm
Lead Channel Pacing Threshold Amplitude: 0.625 V
Lead Channel Pacing Threshold Amplitude: 0.875 V
Lead Channel Pacing Threshold Amplitude: 1.625 V
Lead Channel Pacing Threshold Pulse Width: 0.5 ms
Lead Channel Pacing Threshold Pulse Width: 0.5 ms
Lead Channel Pacing Threshold Pulse Width: 0.5 ms
Lead Channel Sensing Intrinsic Amplitude: 5 mV
Lead Channel Sensing Intrinsic Amplitude: 8.2 mV
Lead Channel Setting Pacing Amplitude: 1.875
Lead Channel Setting Pacing Amplitude: 2 V
Lead Channel Setting Pacing Amplitude: 2.625
Lead Channel Setting Pacing Pulse Width: 0.5 ms
Lead Channel Setting Pacing Pulse Width: 0.5 ms
Lead Channel Setting Sensing Sensitivity: 2.5 mV
Pulse Gen Model: 3262
Pulse Gen Serial Number: 9003554

## 2020-01-14 NOTE — Progress Notes (Signed)
Remote pacemaker transmission.   

## 2020-02-17 ENCOUNTER — Other Ambulatory Visit: Payer: Self-pay | Admitting: Orthopedic Surgery

## 2020-02-17 DIAGNOSIS — M25374 Other instability, right foot: Secondary | ICD-10-CM

## 2020-02-22 ENCOUNTER — Other Ambulatory Visit: Payer: Self-pay

## 2020-02-23 ENCOUNTER — Inpatient Hospital Stay: Admission: RE | Admit: 2020-02-23 | Payer: Medicaid Other | Source: Ambulatory Visit

## 2020-03-08 ENCOUNTER — Inpatient Hospital Stay: Admission: RE | Admit: 2020-03-08 | Payer: Medicaid Other | Source: Ambulatory Visit

## 2020-03-09 ENCOUNTER — Ambulatory Visit
Admission: RE | Admit: 2020-03-09 | Discharge: 2020-03-09 | Disposition: A | Discharge status: Home / Self Care | Payer: Medicaid Other | Source: Ambulatory Visit | Attending: Orthopedic Surgery | Admitting: Orthopedic Surgery

## 2020-03-09 DIAGNOSIS — M25374 Other instability, right foot: Secondary | ICD-10-CM

## 2020-03-09 MED ORDER — IOPAMIDOL (ISOVUE-M 200) INJECTION 41%
1.0000 mL | Freq: Once | INTRAMUSCULAR | Status: AC
  Administered 2020-03-09: 1 mL via INTRA_ARTICULAR

## 2020-03-09 MED ORDER — METHYLPREDNISOLONE ACETATE 40 MG/ML INJ SUSP (RADIOLOG
90.0000 mg | Freq: Once | INTRAMUSCULAR | Status: AC
  Administered 2020-03-09: 90 mg via INTRA_ARTICULAR

## 2020-03-30 ENCOUNTER — Telehealth: Payer: Self-pay | Admitting: Internal Medicine

## 2020-03-30 ENCOUNTER — Emergency Department (HOSPITAL_COMMUNITY)
Admission: EM | Admit: 2020-03-30 | Discharge: 2020-03-30 | Disposition: A | Discharge status: Home / Self Care | Payer: Medicaid Other | Attending: Emergency Medicine | Admitting: Emergency Medicine

## 2020-03-30 ENCOUNTER — Emergency Department (HOSPITAL_COMMUNITY): Payer: Medicaid Other

## 2020-03-30 DIAGNOSIS — Z711 Person with feared health complaint in whom no diagnosis is made: Secondary | ICD-10-CM

## 2020-03-30 DIAGNOSIS — I5022 Chronic systolic (congestive) heart failure: Secondary | ICD-10-CM | POA: Insufficient documentation

## 2020-03-30 DIAGNOSIS — Z7982 Long term (current) use of aspirin: Secondary | ICD-10-CM | POA: Diagnosis not present

## 2020-03-30 DIAGNOSIS — R079 Chest pain, unspecified: Secondary | ICD-10-CM | POA: Diagnosis present

## 2020-03-30 DIAGNOSIS — R0789 Other chest pain: Secondary | ICD-10-CM | POA: Insufficient documentation

## 2020-03-30 DIAGNOSIS — Z950 Presence of cardiac pacemaker: Secondary | ICD-10-CM | POA: Diagnosis not present

## 2020-03-30 DIAGNOSIS — Z91040 Latex allergy status: Secondary | ICD-10-CM | POA: Diagnosis not present

## 2020-03-30 DIAGNOSIS — Z202 Contact with and (suspected) exposure to infections with a predominantly sexual mode of transmission: Secondary | ICD-10-CM | POA: Insufficient documentation

## 2020-03-30 DIAGNOSIS — J45909 Unspecified asthma, uncomplicated: Secondary | ICD-10-CM | POA: Insufficient documentation

## 2020-03-30 DIAGNOSIS — F17210 Nicotine dependence, cigarettes, uncomplicated: Secondary | ICD-10-CM | POA: Insufficient documentation

## 2020-03-30 LAB — URINALYSIS, ROUTINE W REFLEX MICROSCOPIC
Bilirubin Urine: NEGATIVE
Glucose, UA: NEGATIVE mg/dL
Hgb urine dipstick: NEGATIVE
Ketones, ur: NEGATIVE mg/dL
Leukocytes,Ua: NEGATIVE
Nitrite: NEGATIVE
Protein, ur: NEGATIVE mg/dL
Specific Gravity, Urine: 1.016 (ref 1.005–1.030)
pH: 7 (ref 5.0–8.0)

## 2020-03-30 LAB — TROPONIN I (HIGH SENSITIVITY)
Troponin I (High Sensitivity): 4 ng/L (ref <18)
Troponin I (High Sensitivity): 3 ng/L (ref <18)

## 2020-03-30 LAB — CBC WITH DIFFERENTIAL/PLATELET
Abs Immature Granulocytes: 0 10*3/uL (ref 0.00–0.07)
Basophils Absolute: 0.1 10*3/uL (ref 0.0–0.1)
Basophils Relative: 1 %
Eosinophils Absolute: 0.3 10*3/uL (ref 0.0–0.5)
Eosinophils Relative: 6 %
HCT: 43.1 % (ref 39.0–52.0)
Hemoglobin: 14.7 g/dL (ref 13.0–17.0)
Immature Granulocytes: 0 %
Lymphocytes Relative: 33 %
Lymphs Abs: 1.7 10*3/uL (ref 0.7–4.0)
MCH: 31.8 pg (ref 26.0–34.0)
MCHC: 34.1 g/dL (ref 30.0–36.0)
MCV: 93.3 fL (ref 80.0–100.0)
Monocytes Absolute: 0.5 10*3/uL (ref 0.1–1.0)
Monocytes Relative: 9 %
Neutro Abs: 2.6 10*3/uL (ref 1.7–7.7)
Neutrophils Relative %: 51 %
Platelets: 201 10*3/uL (ref 150–400)
RBC: 4.62 MIL/uL (ref 4.22–5.81)
RDW: 13.5 % (ref 11.5–15.5)
WBC: 5.1 10*3/uL (ref 4.0–10.5)
nRBC: 0 % (ref 0.0–0.2)

## 2020-03-30 LAB — BASIC METABOLIC PANEL
Anion gap: 6 (ref 5–15)
BUN: 14 mg/dL (ref 6–20)
CO2: 26 mmol/L (ref 22–32)
Calcium: 9.2 mg/dL (ref 8.9–10.3)
Chloride: 106 mmol/L (ref 98–111)
Creatinine, Ser: 0.87 mg/dL (ref 0.61–1.24)
GFR, Estimated: >60 mL/min (ref >60)
Glucose, Bld: 98 mg/dL (ref 70–99)
Potassium: 3.8 mmol/L (ref 3.5–5.1)
Sodium: 138 mmol/L (ref 135–145)

## 2020-03-30 LAB — HIV ANTIBODY (ROUTINE TESTING W REFLEX): HIV Screen 4th Generation wRfx: NONREACTIVE

## 2020-03-30 NOTE — ED Provider Notes (Signed)
[facility] EMERGENCY DEPARTMENT Provider Note   CSN: [id number] Arrival date & time: 03/30/20  0630     History Chief Complaint  Patient presents with  . Chest Pain    Mario Wyatt is a 30 y.o. male.  The history is provided by the patient and medical records. No language interpreter was used.  Chest Pain    30 year old male significant history of heart block status post [employer] Jude cardiac pacemaker implantation, delusional disorder, GERD, asthma, anxiety, presenting complaining of chest pain.  Patient report for more than a week he has had recurrent pain in his chest.  He described pain as a pressure or tightness sensation to the chest usually brought on with exertion.  Pain normally lasting for 30 minutes to an hour, accompanied with feeling lightheadedness, dizzy, nauseous with heart palpitation and occasional jolt in his chest.  He told me just wait for the pain to alleviate and deny any specific treatment tried.  He felt that his pacemaker has been misfiring.  Sometimes with the chest pain he also complained of a throbbing headache.  His last chest pain episode was yesterday.  He did reach out to his cardiologist who recommend patient to come to the ER for further evaluation.  Denies any active chest pain at this time.  He admits to tobacco and occasional alcohol use.  He also admits to using energy drinks on a frequent basis.  He endorsed increasing stress.  He denies polysubstance abuse such as cocaine use.  He has had pacemaker since the age of 4 and report due to second heart block.  Past Medical History:  Diagnosis Date  . Anxiety   . Arthritis    "wrists, knees; probably all over my body" (04/05/2017)  . Asthma   . Attention deficit hyperactivity disorder (ADHD)    pt denies this hx on 04/05/2017  . Cardiomyopathy (HCC)   . Chronic back pain    "right neck to lower back; shoulders" (04/05/2017)  . Congenital complete AV block   . Daily headache   .  GERD (gastroesophageal reflux disease)   . Macular degeneration    bilateral  . Migraine    "a couple/month" (04/05/2017)  . Nicotine dependence 10/14/2019  . Pacemaker    Placed when pt was 30 years old.  . S/P cardiac pacemaker procedure, 09/08/14, [employer] medical 09/09/2014  . Vitamin D deficiency 10/14/2019  . Wears glasses     Patient Active Problem List   Diagnosis Date Noted  . Nicotine dependence 10/14/2019  . Vitamin D deficiency 10/14/2019  . Anxiety   . Attention deficit hyperactivity disorder (ADHD)   . Congenital complete AV block   . GERD (gastroesophageal reflux disease)   . Closed displaced intraarticular fracture of right calcaneus 10/13/2019  . NICM (nonischemic cardiomyopathy) (HCC) 05/11/2019  . Delusional disorder (HCC) 02/26/2018  . Psychosis (HCC) 02/24/2018  . Closed left maxillary fracture (HCC)   . Acute psychosis (HCC) 02/18/2018  . Acute respiratory insufficiency   . Head trauma   . Foreign body alimentary tract, subsequent encounter   . Gastric foreign body, initial encounter   . Cervical compression fracture, sequela 10/28/2017  . Chronic myofascial pain 04/17/2017  . Dilated cardiomyopathy (HCC) 04/05/2017  . Chronic systolic heart failure (HCC) 04/04/2017  . Mild intermittent asthma without complication 11/20/2016  . Asthma 07/03/2016  . Shellfish allergy 09/29/2014  . Tired 09/29/2014  . Pacemaker at end of battery life 09/09/2014  . S/P cardiac pacemaker  procedure, 09/08/14, [employer] medical, gen change 09/09/2014  . Complete heart block (HCC) 09/08/2014  . Adult ADHD 05/26/2014  . Cervical pain 05/26/2014  . Chronic back pain 05/26/2014  . Neck pain 05/26/2014  . AV block, High Grade, congenital 06/11/2013  . Congenital complete atrioventricular block 06/11/2013  . Pacemaker 05/27/2013  . Vomiting 04/05/2013  . Diarrhea 04/05/2013  . Nausea 04/05/2013  . Artificial cardiac pacemaker 03/21/2011  . 2nd degree atrioventricular block  03/21/2011  . Second degree AV block 03/21/2011  . Presence of cardiac pacemaker 03/21/2011  . 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 CARDIAC PACEMAKER PLACEMENT  2007   second degree heart block  . EP IMPLANTABLE DEVICE N/A 09/08/2014   Procedure: Pacemaker Implant;  Surgeon: Duke Salvia, MD;  Location: [facility] INVASIVE CV LAB;  Service: Cardiovascular;  Laterality: N/A;  . ESOPHAGOGASTRODUODENOSCOPY N/A 02/18/2018   Procedure: ESOPHAGOGASTRODUODENOSCOPY (EGD);  Surgeon: Meryl Dare, MD;  Location: [facility] ENDOSCOPY;  Service: Endoscopy;  Laterality: N/A;  . FOREIGN BODY REMOVAL N/A 02/18/2018   Procedure: FOREIGN BODY REMOVAL;  Surgeon: Meryl Dare, MD;  Location: [facility] ENDOSCOPY;  Service: Endoscopy;  Laterality: N/A;  . HERNIA REPAIR    . ORIF CALCANEOUS FRACTURE Right 10/13/2019   Procedure: OPEN REDUCTION INTERNAL FIXATION (ORIF) CALCANEOUS FRACTURE;  Surgeon: Myrene Galas, MD;  Location: [facility] OR;  Service: Orthopedics;  Laterality: Right;  . PACEMAKER INSERTION  1999  . PACEMAKER LEAD REMOVAL N/A 04/04/2017   Procedure: LEAD EXTRACTION AND NEW LEAD PLACEMENT AND CHANGE OUT OF OLD PACEMAKER;  Surgeon: Marinus Maw, MD;  Location: [facility] OR;  Service: Cardiovascular;  Laterality: N/A;  . PERMANENT PACEMAKER GENERATOR CHANGE  04/04/2017   LEAD EXTRACTION AND NEW LEAD PLACEMENT AND CHANGE OUT OF OLD PACEMAKER  . UMBILICAL HERNIA REPAIR     "when I was a child"       Family History  Problem Relation Age of Onset  . Hypertension Mother   . Diabetes Mother   . Bipolar disorder Mother   . Drug abuse Mother   . Glaucoma Father   . Anesthesia problems Neg Hx   . Hypotension Neg Hx   . Malignant hyperthermia Neg Hx   . Pseudochol deficiency Neg Hx     Social History   Tobacco Use  . Smoking status: Current Some Day Smoker    Packs/day: 0.50    Years: 10.00    Pack years: 5.00    Types: Cigarettes    Last  attempt to quit: 10/01/2013    Years since quitting: 6.4  . Smokeless tobacco: Never Used  Vaping Use  . Vaping Use: Former  Substance Use Topics  . Alcohol use: Not Currently  . Drug use: Not Currently    Types: Amphetamines    Home Medications Prior to Admission medications   Medication Sig Start Date End Date Taking? Authorizing Provider  acetaminophen (TYLENOL) 500 MG tablet Take 1,000 mg by mouth daily as needed for mild pain.    [provider]  aspirin EC 325 MG tablet Take 1 tablet (325 mg total) by mouth daily. Patient not taking: Reported on 11/28/2019 10/16/19   Myrene Galas, MD  carvedilol (COREG) 3.125 MG tablet Take 1 tablet (3.125 mg total) by mouth 2 (two) times daily with a meal. 05/12/19   Duke Salvia, MD  Cholecalciferol (VITAMIN D) 125 MCG (5000 UT) CAPS Take 1 capsule by mouth  daily. Patient not taking: Reported on 11/28/2019 10/16/19   Myrene Galas, MD  docusate sodium (COLACE) 100 MG capsule Take 1 capsule (100 mg total) by mouth 2 (two) times daily. Patient not taking: Reported on 11/28/2019 10/16/19   Myrene Galas, MD  EPINEPHrine (EPIPEN 2-PAK) 0.3 mg/0.3 mL IJ SOAJ injection Inject 0.3 mLs (0.3 mg total) into the muscle once as needed (anaphylaxis). Patient not taking: Reported on 10/12/2019 02/26/18   Armandina Stammer I, NP  gabapentin (NEURONTIN) 300 MG capsule Take 300 mg by mouth 3 (three) times daily. Patient not taking: Reported on 11/28/2019 05/14/18   [provider]  lisinopril (ZESTRIL) 5 MG tablet Take 1 tablet (5 mg total) by mouth daily. 05/12/19   Duke Salvia, MD  methocarbamol (ROBAXIN) 500 MG tablet Take 1-2 tablets (500-1,000 mg total) by mouth every 6 (six) hours as needed for muscle spasms. Patient not taking: Reported on 11/28/2019 10/16/19   Myrene Galas, MD  oxyCODONE-acetaminophen (PERCOCET) 7.5-325 MG tablet Take 1-2 tablets by mouth every 8 (eight) hours as needed for moderate pain or severe pain. Patient not  taking: Reported on 11/28/2019 10/16/19   Montez Morita, PA-C  PROAIR HFA 108 [phone] Base) MCG/ACT inhaler Inhale 2 puffs into the lungs every 6 (six) hours as needed for wheezing. Patient not taking: Reported on 11/28/2019 07/22/18   [provider]    Allergies    Iodine, Shellfish allergy, and Latex  Review of Systems   Review of Systems  Cardiovascular: Positive for chest pain.  All other systems reviewed and are negative.   Physical Exam Updated Vital Signs BP 116/82   Pulse 60   Temp 98.2 F (36.8 C) (Oral)   Resp 19   Ht 5\' 3"  (1.6 m)   Wt 56.7 kg   SpO2 99%   BMI 22.14 kg/m   Physical Exam Vitals and nursing note reviewed.  Constitutional:      General: He is not in acute distress.    Appearance: He is well-developed.  HENT:     Head: Atraumatic.  Eyes:     Conjunctiva/sclera: Conjunctivae normal.  Cardiovascular:     Rate and Rhythm: Normal rate and regular rhythm.     Pulses: Normal pulses.     Heart sounds: Normal heart sounds.  Pulmonary:     Effort: Pulmonary effort is normal.     Breath sounds: Wheezing (Faint expiratory wheezes heard in no acute respiratory discomfort.) present.  Abdominal:     Palpations: Abdomen is soft.     Tenderness: There is no abdominal tenderness.  Musculoskeletal:     Cervical back: Neck supple.     Right lower leg: No edema.     Left lower leg: No edema.  Skin:    Findings: No rash.  Neurological:     Mental Status: He is alert and oriented to person, place, and time.  Psychiatric:        Mood and Affect: Mood normal.     ED Results / Procedures / Treatments   Labs (all labs ordered are listed, but only abnormal results are displayed) Labs Reviewed  BASIC METABOLIC PANEL  CBC WITH DIFFERENTIAL/PLATELET  URINALYSIS, ROUTINE W REFLEX MICROSCOPIC  RPR  HIV ANTIBODY (ROUTINE TESTING W REFLEX)  GC/CHLAMYDIA PROBE AMP ([place]) NOT AT [facility]  TROPONIN I (HIGH SENSITIVITY)  TROPONIN I (HIGH SENSITIVITY)     EKG EKG Interpretation  Date/Time:  Wednesday March 30 2020 09:55:44 EDT Ventricular Rate:  69 PR Interval:  198  QRS Duration: 147 QT Interval:  433 QTC Calculation: 464 R Axis:   217 Text Interpretation: atrial sensed ventricular pacing Confirmed by Benjiman Core [phone]) on 03/30/2020 10:00:57 AM   Radiology DG Chest 2 View  Result Date: 03/30/2020 CLINICAL DATA:  Chest pain with cardiac arrhythmia EXAM: CHEST - 2 VIEW COMPARISON:  November 28, 2019 FINDINGS: There is slight scarring in the lateral right base. There is no edema or airspace opacity. Heart size and pulmonary vascular normal. Pacemaker leads are attached to the right atrium, right ventricle, and coronary sinus. No adenopathy. No pneumothorax. No bone lesions. IMPRESSION: Slight scarring lateral right base. No edema or airspace opacity. Heart size normal. Pacemaker leads attached to right atrium, right ventricle, and coronary sinus. Electronically Signed   By: Bretta Bang III M.D.   On: 03/30/2020 10:49    Procedures Procedures   Medications Ordered in ED Medications - No data to display  ED Course  I have reviewed the triage vital signs and the nursing notes.  Pertinent labs & imaging results that were available during my care of the patient were reviewed by me and considered in my medical decision making (see chart for details).    MDM Rules/Calculators/A&P                          BP 114/87   Pulse 61   Temp 98.2 F (36.8 C) (Oral)   Resp (!) 21   Ht 5\' 3"  (1.6 m)   Wt 56.7 kg   SpO2 98%   BMI 22.14 kg/m   Final Clinical Impression(s) / ED Diagnoses Final diagnoses:  Other chest pain  Concern about sexually transmitted disease in male without diagnosis    Rx / DC Orders ED Discharge Orders    None     10:00 AM Patient with [facility] pacemaker who has had recurrent exertional chest pain and heart palpitation.  Patient concerned that his pacemaker is firing.  He will benefit from  a pacemaker interrogation.  He also admits to using energy drinks on a regular basis and have been feeling increasing stress at work.  Work-up initiated.  1:34 PM Chest x-ray, EKG, labs and troponins are reassuring.  [employer] Jude pacemaker was interrogated and no abnormal activity was noted.  The representative [employer] representative will send someone to manually assess the pace maker as well.  Pt now request STI check.  Patient denies any penile discharge or rash.  States that he does involve in unprotected sex with one partner.  He denies any prior STI.  On examination no inguinal lymphadenopathy or inguinal hernia noted.  Normal circumcised penis free of lesion or rash.  No obvious discharge noted.  Testicle nontender with normal lie.  STI screening obtained, patient made aware that if test positive, he will be contacted with appropriate treatment.  3:05 PM Negative delta troponin, labs are reassuring, I have low suspicion for ACS causing his chest discomfort.  As mentioned earlier, pacemaker has been interrogated without abnormal firing.  STI screen has been performed.  Patient has been resting comfortably throughout his ER visit.  Plan to discharge with close outpatient follow-up recommended. HEAR score of 2, low risk of MACE.   3:11 PM Pt sign out to oncoming team who will dispo pt pending recommendation from [facility]. Jude representative once pt is evaluated.     [facility], PA-C 03/30/20 1512    04/01/20, MD 03/30/20 [phone]

## 2020-03-30 NOTE — Telephone Encounter (Signed)
Spoke with pt who is complaining of active CP and HA now.  Pt reports intermittent CP x 1 weeks with fatigue, vomiting sweating SOB and headache. He states he has felt as if his pacemaker has actually shocked him in the past week and he can feel his PPM jumping.  Pt reports he is non-compliant with medications.  Pt advised with active CP he will need to be evaluated further at the ED and to call 911 or have someone drive him but he should not drive himself.   Pt requested RN speak with his boss to provide proof of advisement.  Spoke with Wynona Canes with pt also on the line and advised pt has been directed to go to ED now.   Pt verbalizes understanding of instructions given and agrees with current plan.

## 2020-03-30 NOTE — ED Notes (Signed)
Patient transported to X-ray 

## 2020-03-30 NOTE — Discharge Instructions (Signed)
You have been evaluated for your symptoms.  Fortunately your pacemaker did not show any abnormal firing or abnormal activity.  Your labs are reassuring.  Sexually transmitted infection screening have been obtained, if positive you will be notified in the next several days with appropriate treatment.  Follow-up closely with your doctor for further care.

## 2020-03-30 NOTE — ED Provider Notes (Signed)
8:57 PM RN was able to contact [employer] to confirm no changes needed regarding pacemaker. Tech was here earlier but left no recommendations, leading to delay in discharge.   BP (!) 114/101   Pulse 63   Temp 98.2 F (36.8 C) (Oral)   Resp 14   Ht 5\' 3"  (1.6 m)   Wt 56.7 kg   SpO2 98%   BMI 22.14 kg/m      , PA-C 03/30/20 2058    2059, MD 03/31/20 1221

## 2020-03-30 NOTE — ED Triage Notes (Signed)
Pt arrived to ED via POV. Pt c/o chest intermitting for 2 weeks. Describes pain as heaviness in chest, denies sob or radiating. Reports fatigue increasing, night sweats and also wants STD testing while here.   Pt with hx of pacemaker placement and cardiac hx

## 2020-03-30 NOTE — Telephone Encounter (Signed)
Pt c/o of Chest Pain: STAT if CP now or developed within 24 hours  1. Are you having CP right now? Yes  2. Are you experiencing any other symptoms (ex. SOB, nausea, vomiting, sweating)? Sob, vomiting, headaches, sweating  3. How long have you been experiencing CP? For a both a week with sharp pains  4. Is your CP continuous or coming and going? continuous  5. Have you taken Nitroglycerin? No  Patient states that he does have any nitroglycerin to take. His monitor has been jumping. He states that the pain in his chest is extremly sharp along with his headache.  ?

## 2020-03-31 ENCOUNTER — Ambulatory Visit (INDEPENDENT_AMBULATORY_CARE_PROVIDER_SITE_OTHER): Payer: Medicaid Other

## 2020-03-31 DIAGNOSIS — I442 Atrioventricular block, complete: Secondary | ICD-10-CM | POA: Diagnosis not present

## 2020-03-31 LAB — CUP PACEART REMOTE DEVICE CHECK
Battery Remaining Longevity: 77 mo
Battery Remaining Percentage: 95.5 %
Battery Voltage: 2.96 V
Brady Statistic AP VP Percent: 31 %
Brady Statistic AP VS Percent: 1 %
Brady Statistic AS VP Percent: 69 %
Brady Statistic AS VS Percent: 1 %
Brady Statistic RA Percent Paced: 30 %
Date Time Interrogation Session: 20220331040022
Implantable Lead Implant Date: 20160907
Implantable Lead Implant Date: 20160907
Implantable Lead Implant Date: 20190404
Implantable Lead Location: 753858
Implantable Lead Location: 753859
Implantable Lead Location: 753860
Implantable Lead Model: 1948
Implantable Pulse Generator Implant Date: 20190404
Lead Channel Impedance Value: 450 Ohm
Lead Channel Impedance Value: 560 Ohm
Lead Channel Impedance Value: 800 Ohm
Lead Channel Pacing Threshold Amplitude: 0.625 V
Lead Channel Pacing Threshold Amplitude: 0.875 V
Lead Channel Pacing Threshold Amplitude: 2 V
Lead Channel Pacing Threshold Pulse Width: 0.5 ms
Lead Channel Pacing Threshold Pulse Width: 0.5 ms
Lead Channel Pacing Threshold Pulse Width: 0.5 ms
Lead Channel Sensing Intrinsic Amplitude: 2.3 mV
Lead Channel Sensing Intrinsic Amplitude: 7.2 mV
Lead Channel Setting Pacing Amplitude: 1.875
Lead Channel Setting Pacing Amplitude: 2 V
Lead Channel Setting Pacing Amplitude: 3 V
Lead Channel Setting Pacing Pulse Width: 0.5 ms
Lead Channel Setting Pacing Pulse Width: 0.5 ms
Lead Channel Setting Sensing Sensitivity: 2.5 mV
Pulse Gen Model: 3262
Pulse Gen Serial Number: 9003554

## 2020-03-31 LAB — GC/CHLAMYDIA PROBE AMP (~~LOC~~) NOT AT ARMC
Chlamydia: NEGATIVE
Comment: NEGATIVE
Comment: NORMAL
Neisseria Gonorrhea: NEGATIVE

## 2020-03-31 LAB — RPR
RPR Ser Ql: REACTIVE — AB
RPR Titer: 1:1 {titer}

## 2020-04-01 LAB — T.PALLIDUM AB, TOTAL: T Pallidum Abs: NONREACTIVE

## 2020-04-04 ENCOUNTER — Telehealth: Payer: Self-pay

## 2020-04-04 NOTE — Telephone Encounter (Signed)
Patient called and reports when he will wake up in the middle of the night with chest pain and diaphoresis for x 2-3 weeks. Patient reports he is currently having chest pain rated 6/10 at this time. Patient is not able to send a remote transmission. Advised patient to go to the ED for further evaluation. Advised not to drive or have someone drive him or call [phone]. Patient agreeable to plan.

## 2020-04-13 NOTE — Progress Notes (Signed)
Remote pacemaker transmission.   

## 2020-05-19 ENCOUNTER — Other Ambulatory Visit: Payer: Self-pay

## 2020-05-19 ENCOUNTER — Ambulatory Visit (INDEPENDENT_AMBULATORY_CARE_PROVIDER_SITE_OTHER): Payer: Medicaid Other | Admitting: Internal Medicine

## 2020-05-19 ENCOUNTER — Encounter: Payer: Self-pay | Admitting: Internal Medicine

## 2020-05-19 VITALS — BP 108/70 | HR 77 | Ht 63.0 in | Wt 121.8 lb

## 2020-05-19 DIAGNOSIS — I442 Atrioventricular block, complete: Secondary | ICD-10-CM

## 2020-05-19 DIAGNOSIS — Z950 Presence of cardiac pacemaker: Secondary | ICD-10-CM

## 2020-05-19 DIAGNOSIS — I428 Other cardiomyopathies: Secondary | ICD-10-CM | POA: Diagnosis not present
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
## 2020-05-19 NOTE — Patient Instructions (Signed)

## 2020-05-23 IMAGING — DX DG HAND COMPLETE 3+V*R*
1 series · 3 of 3 positions shown · non-contrast
Comparison: None.

CLINICAL DATA: Initial evaluation for acute injury, hit head on
wall.

EXAM:
RIGHT HAND - COMPLETE 3+ VIEW

[Series 1: hand · 0.14mm/px · 3 of 3 slices shown]
[im 1/3]
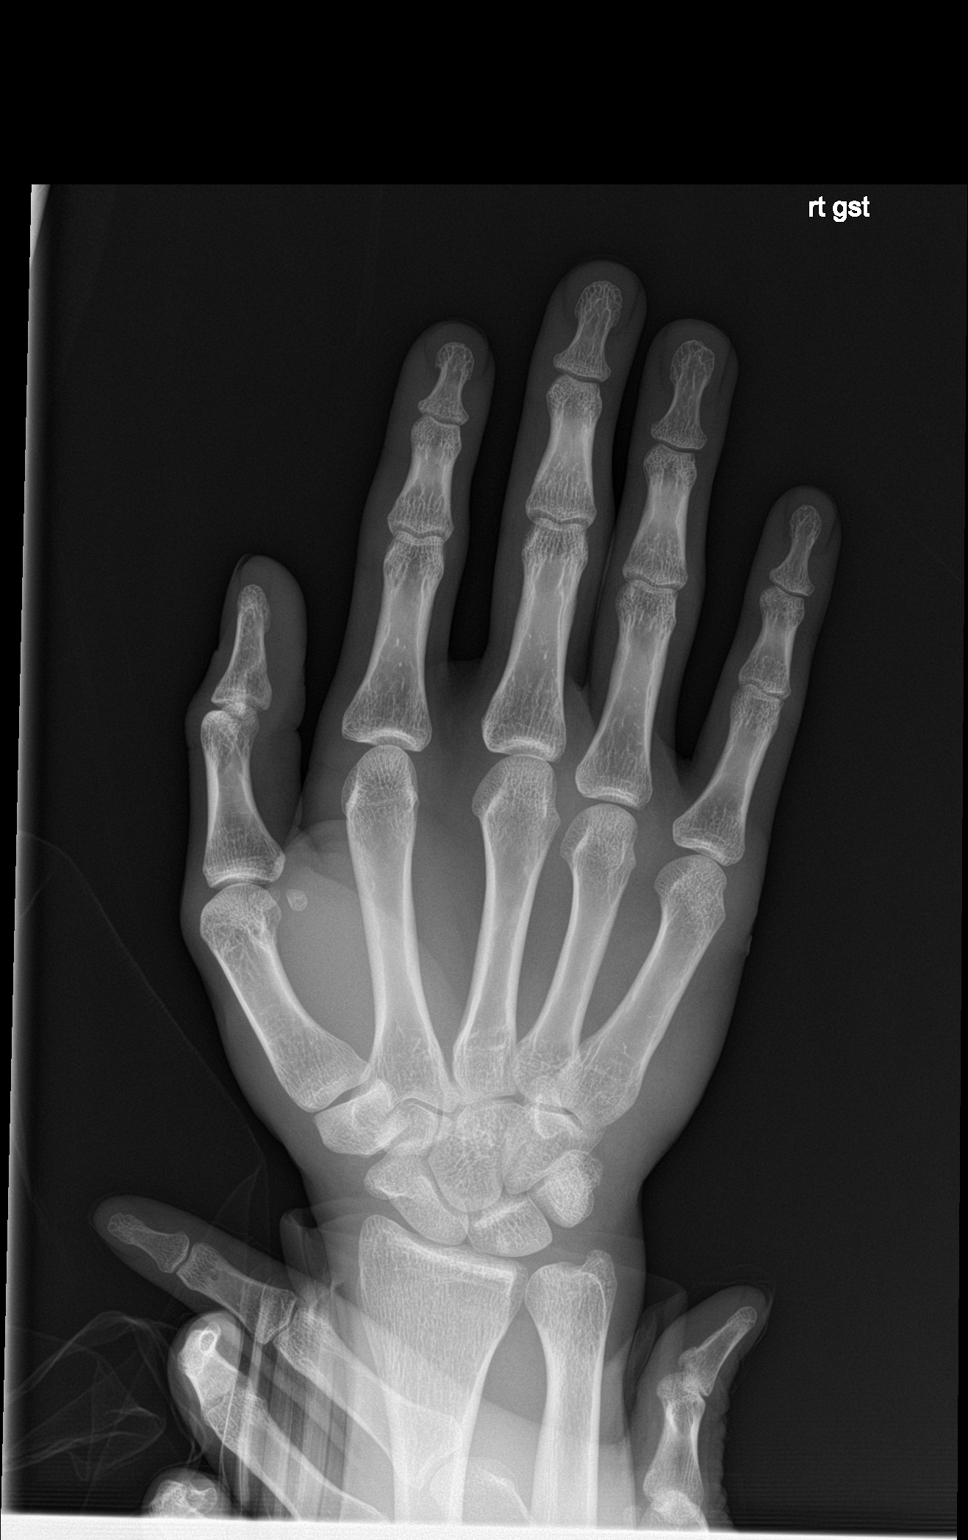
[im 2/3]
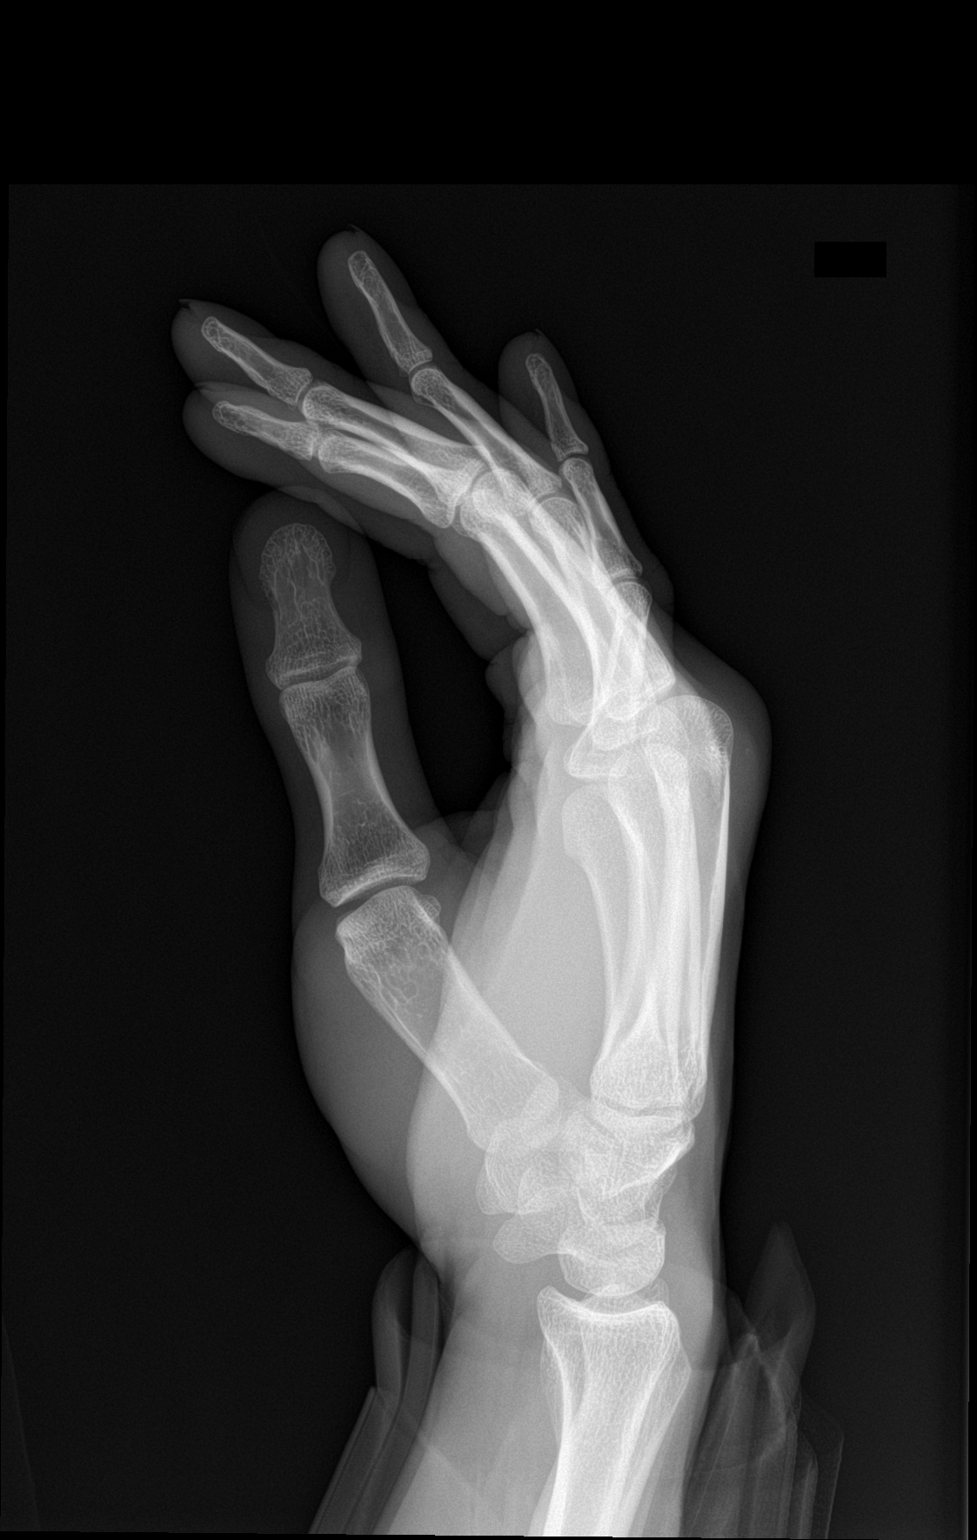
[im 3/3]
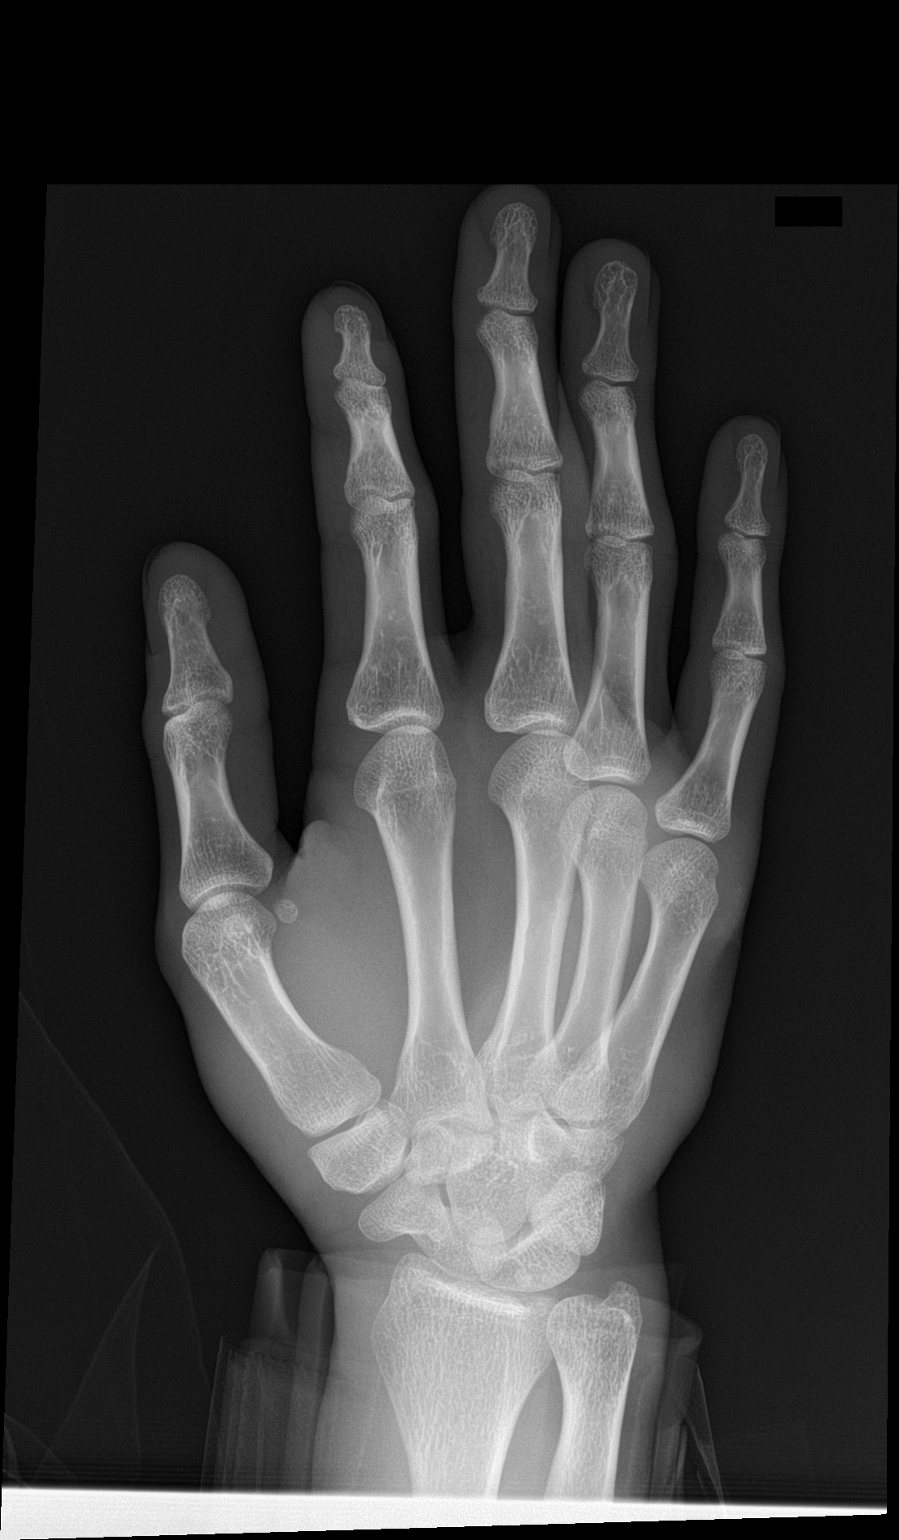

[3 of 3 positions shown; findings below may reference images not displayed]

FINDINGS: There is no evidence of fracture or dislocation. There is no
evidence of arthropathy or other focal bone abnormality. Mild soft
tissue irregularity at the ulnar aspect of the right hand.
Additionally, mild soft tissue swelling overlies the metacarpal
heads.
IMPRESSION: 1. No acute osseus abnormality.
2. Mild soft tissue swelling overlying the metacarpal heads with
additional soft tissue irregularity at the ulnar aspect of the hand,
which could reflect acute soft tissue injury. No radiopaque foreign
body.

## 2020-06-29 ENCOUNTER — Other Ambulatory Visit: Payer: Self-pay | Admitting: Internal Medicine

## 2020-06-30 ENCOUNTER — Ambulatory Visit (INDEPENDENT_AMBULATORY_CARE_PROVIDER_SITE_OTHER): Payer: Medicaid Other

## 2020-06-30 DIAGNOSIS — I442 Atrioventricular block, complete: Secondary | ICD-10-CM

## 2020-06-30 LAB — CUP PACEART REMOTE DEVICE CHECK
Battery Remaining Longevity: 40 mo
Battery Remaining Percentage: 50 %
Battery Voltage: 2.96 V
Brady Statistic AP VP Percent: 27 %
Brady Statistic AP VS Percent: 1 %
Brady Statistic AS VP Percent: 73 %
Brady Statistic AS VS Percent: 1 %
Brady Statistic RA Percent Paced: 27 %
Date Time Interrogation Session: 20220630040021
Implantable Lead Implant Date: 20160907
Implantable Lead Implant Date: 20160907
Implantable Lead Implant Date: 20190404
Implantable Lead Location: 753858
Implantable Lead Location: 753859
Implantable Lead Location: 753860
Implantable Lead Model: 1948
Implantable Pulse Generator Implant Date: 20190404
Lead Channel Impedance Value: 460 Ohm
Lead Channel Impedance Value: 590 Ohm
Lead Channel Impedance Value: 850 Ohm
Lead Channel Pacing Threshold Amplitude: 0.75 V
Lead Channel Pacing Threshold Amplitude: 0.875 V
Lead Channel Pacing Threshold Amplitude: 1.625 V
Lead Channel Pacing Threshold Pulse Width: 0.5 ms
Lead Channel Pacing Threshold Pulse Width: 0.5 ms
Lead Channel Pacing Threshold Pulse Width: 0.5 ms
Lead Channel Sensing Intrinsic Amplitude: 2.5 mV
Lead Channel Sensing Intrinsic Amplitude: 7.5 mV
Lead Channel Setting Pacing Amplitude: 1.875
Lead Channel Setting Pacing Amplitude: 2 V
Lead Channel Setting Pacing Amplitude: 2.625
Lead Channel Setting Pacing Pulse Width: 0.5 ms
Lead Channel Setting Pacing Pulse Width: 0.5 ms
Lead Channel Setting Sensing Sensitivity: 2.5 mV
Pulse Gen Model: 3262
Pulse Gen Serial Number: 9003554

## 2020-07-19 NOTE — Progress Notes (Signed)
Remote pacemaker transmission.   

## 2020-08-15 ENCOUNTER — Other Ambulatory Visit (HOSPITAL_COMMUNITY): Payer: Self-pay

## 2020-09-29 ENCOUNTER — Ambulatory Visit (INDEPENDENT_AMBULATORY_CARE_PROVIDER_SITE_OTHER): Payer: Medicaid Other

## 2020-09-29 DIAGNOSIS — I442 Atrioventricular block, complete: Secondary | ICD-10-CM | POA: Diagnosis not present

## 2020-09-29 LAB — CUP PACEART REMOTE DEVICE CHECK
Battery Remaining Longevity: 37 mo
Battery Remaining Percentage: 46 %
Battery Voltage: 2.96 V
Brady Statistic AP VP Percent: 30 %
Brady Statistic AP VS Percent: 1 %
Brady Statistic AS VP Percent: 70 %
Brady Statistic AS VS Percent: 1 %
Brady Statistic RA Percent Paced: 30 %
Date Time Interrogation Session: 20220929050811
Implantable Lead Implant Date: 20160907
Implantable Lead Implant Date: 20160907
Implantable Lead Implant Date: 20190404
Implantable Lead Location: 753858
Implantable Lead Location: 753859
Implantable Lead Location: 753860
Implantable Lead Model: 1948
Implantable Pulse Generator Implant Date: 20190404
Lead Channel Impedance Value: 450 Ohm
Lead Channel Impedance Value: 560 Ohm
Lead Channel Impedance Value: 830 Ohm
Lead Channel Pacing Threshold Amplitude: 0.625 V
Lead Channel Pacing Threshold Amplitude: 0.875 V
Lead Channel Pacing Threshold Amplitude: 2 V
Lead Channel Pacing Threshold Pulse Width: 0.5 ms
Lead Channel Pacing Threshold Pulse Width: 0.5 ms
Lead Channel Pacing Threshold Pulse Width: 0.5 ms
Lead Channel Sensing Intrinsic Amplitude: 2.6 mV
Lead Channel Sensing Intrinsic Amplitude: 9.2 mV
Lead Channel Setting Pacing Amplitude: 1.875
Lead Channel Setting Pacing Amplitude: 2 V
Lead Channel Setting Pacing Amplitude: 3 V
Lead Channel Setting Pacing Pulse Width: 0.5 ms
Lead Channel Setting Pacing Pulse Width: 0.5 ms
Lead Channel Setting Sensing Sensitivity: 2.5 mV
Pulse Gen Model: 3262
Pulse Gen Serial Number: 9003554

## 2020-10-06 NOTE — Progress Notes (Signed)
Remote pacemaker transmission.   

## 2020-11-24 ENCOUNTER — Encounter (HOSPITAL_BASED_OUTPATIENT_CLINIC_OR_DEPARTMENT_OTHER): Payer: Self-pay | Admitting: Obstetrics and Gynecology

## 2020-11-24 ENCOUNTER — Other Ambulatory Visit: Payer: Self-pay

## 2020-11-24 ENCOUNTER — Emergency Department (HOSPITAL_BASED_OUTPATIENT_CLINIC_OR_DEPARTMENT_OTHER)
Admission: EM | Admit: 2020-11-24 | Discharge: 2020-11-24 | Disposition: A | Discharge status: Home / Self Care | Payer: Medicaid Other | Attending: Emergency Medicine | Admitting: Emergency Medicine

## 2020-11-24 DIAGNOSIS — Z950 Presence of cardiac pacemaker: Secondary | ICD-10-CM | POA: Insufficient documentation

## 2020-11-24 DIAGNOSIS — F17210 Nicotine dependence, cigarettes, uncomplicated: Secondary | ICD-10-CM | POA: Diagnosis not present

## 2020-11-24 DIAGNOSIS — Z91040 Latex allergy status: Secondary | ICD-10-CM | POA: Diagnosis not present

## 2020-11-24 DIAGNOSIS — J4520 Mild intermittent asthma, uncomplicated: Secondary | ICD-10-CM | POA: Insufficient documentation

## 2020-11-24 DIAGNOSIS — Z20822 Contact with and (suspected) exposure to covid-19: Secondary | ICD-10-CM | POA: Insufficient documentation

## 2020-11-24 DIAGNOSIS — R509 Fever, unspecified: Secondary | ICD-10-CM | POA: Diagnosis present

## 2020-11-24 DIAGNOSIS — J101 Influenza due to other identified influenza virus with other respiratory manifestations: Secondary | ICD-10-CM | POA: Diagnosis not present

## 2020-11-24 DIAGNOSIS — Z79899 Other long term (current) drug therapy: Secondary | ICD-10-CM | POA: Insufficient documentation

## 2020-11-24 DIAGNOSIS — I5022 Chronic systolic (congestive) heart failure: Secondary | ICD-10-CM | POA: Diagnosis not present

## 2020-11-24 LAB — RESP PANEL BY RT-PCR (FLU A&B, COVID) ARPGX2
Influenza A by PCR: POSITIVE — AB
Influenza B by PCR: NEGATIVE
SARS Coronavirus 2 by RT PCR: NEGATIVE

## 2020-11-24 MED ORDER — ONDANSETRON 4 MG PO TBDP: 4.0000 mg | ORAL_TABLET | Freq: Three times a day (TID) | ORAL | 0 refills | Status: DC | PRN

## 2020-11-24 NOTE — ED Triage Notes (Signed)
Pt BIB [facility]. Patient reports to the ER for congestion and coughing with 1 episode of emesis this morning

## 2020-11-24 NOTE — ED Provider Notes (Signed)
[facility] EMERGENCY DEPT Provider Note   CSN: [id number] Arrival date & time: 11/24/20  1756     History Chief Complaint  Patient presents with   Nasal Congestion    Mario Wyatt is a 30 y.o. male.  Patient with history of cardiomyopathy, pacemaker since childhood presents to the emergency department for flulike illness.  Symptoms started early this morning.  Patient has had congestion, fevers, nausea with one episode of vomiting this morning.  Also reports muscle aches and sore throat.  He has had cough that is nonproductive.  No treatments prior to arrival.  Transported by EMS.  He reports headache without neck pain or confusion.  No abdominal pain.  No urinary symptoms.  Onset of symptoms acute.  Course is constant.      Past Medical History:  Diagnosis Date   Anxiety    Arthritis    "wrists, knees; probably all over my body" (04/05/2017)   Asthma    Attention deficit hyperactivity disorder (ADHD)    pt denies this hx on 04/05/2017   Cardiomyopathy ([place])    Chronic back pain    "right neck to lower back; shoulders" (04/05/2017)   Congenital complete AV block    Daily headache    GERD (gastroesophageal reflux disease)    Macular degeneration    bilateral   Migraine    "a couple/month" (04/05/2017)   Nicotine dependence 10/14/2019   Pacemaker    Placed when pt was 30 years old.   S/P cardiac pacemaker procedure, 09/08/14, [employer] 09/09/2014   Vitamin D deficiency 10/14/2019   Wears glasses     Patient Active Problem List   Diagnosis Date Noted   Nicotine dependence 10/14/2019   Vitamin D deficiency 10/14/2019   Anxiety    Attention deficit hyperactivity disorder (ADHD)    Congenital complete AV block    GERD (gastroesophageal reflux disease)    Closed displaced intraarticular fracture of right calcaneus 10/13/2019   NICM (nonischemic cardiomyopathy) ([place]) 05/11/2019   Delusional disorder ([place]) 02/26/2018   Psychosis ([place]) 02/24/2018    Closed left maxillary fracture ([place])    Acute psychosis ([place]) 02/18/2018   Acute respiratory insufficiency    Head trauma    Foreign body alimentary tract, subsequent encounter    Gastric foreign body, initial encounter    Cervical compression fracture, sequela 10/28/2017   Chronic myofascial pain 04/17/2017   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   Chronic systolic heart failure ([place]) 04/04/2017   Mild intermittent asthma without complication 0000000   Asthma 07/03/2016   Shellfish allergy 09/29/2014   Tired 09/29/2014   Pacemaker at end of battery life 09/09/2014   S/P cardiac pacemaker procedure, 09/08/14, [employer], gen change 09/09/2014   Complete heart block ([place]) 09/08/2014   Adult ADHD 05/26/2014   Cervical pain 05/26/2014   Chronic back pain 05/26/2014   Neck pain 05/26/2014   AV block, High Grade, congenital 06/11/2013   Congenital complete atrioventricular block 06/11/2013   Pacemaker 05/27/2013   Vomiting 04/05/2013   Diarrhea 04/05/2013   Nausea 04/05/2013   Artificial cardiac pacemaker 03/21/2011   2nd degree atrioventricular block 03/21/2011   Second degree AV block 03/21/2011   Presence of cardiac pacemaker 03/21/2011   Delusional disorder(297.1) 03/15/2011    Class: Acute    Past Surgical History:  Procedure Laterality Date   CARDIAC PACEMAKER PLACEMENT  2007   second degree heart block   EP IMPLANTABLE DEVICE N/A 09/08/2014   Procedure: Pacemaker Implant;  Surgeon:  Deboraha Sprang, MD;  Location: [facility];  Service: Cardiovascular;  Laterality: N/A;   ESOPHAGOGASTRODUODENOSCOPY N/A 02/18/2018   Procedure: ESOPHAGOGASTRODUODENOSCOPY (EGD);  Surgeon: Ladene Artist, MD;  Location: [facility] ENDOSCOPY;  Service: Endoscopy;  Laterality: N/A;   FOREIGN BODY REMOVAL N/A 02/18/2018   Procedure: FOREIGN BODY REMOVAL;  Surgeon: Ladene Artist, MD;  Location: [facility] ENDOSCOPY;  Service: Endoscopy;  Laterality: N/A;   HERNIA REPAIR     ORIF CALCANEOUS  FRACTURE Right 10/13/2019   Procedure: OPEN REDUCTION INTERNAL FIXATION (ORIF) CALCANEOUS FRACTURE;  Surgeon: Altamese Wheaton, MD;  Location: [facility];  Service: Orthopedics;  Laterality: Right;   PACEMAKER INSERTION  1999   PACEMAKER LEAD REMOVAL N/A 04/04/2017   Procedure: LEAD EXTRACTION AND NEW LEAD PLACEMENT AND CHANGE OUT OF OLD PACEMAKER;  Surgeon: Evans Lance, MD;  Location: [facility] OR;  Service: Cardiovascular;  Laterality: N/A;   PERMANENT PACEMAKER GENERATOR CHANGE  04/04/2017   LEAD EXTRACTION AND NEW LEAD PLACEMENT AND CHANGE OUT OF OLD PACEMAKER   UMBILICAL HERNIA REPAIR     "when I was a child"       Family History  Problem Relation Age of Onset   Hypertension Mother    Diabetes Mother    Bipolar disorder Mother    Drug abuse Mother    Glaucoma Father    Anesthesia problems Neg Hx    Hypotension Neg Hx    Malignant hyperthermia Neg Hx    Pseudochol deficiency Neg Hx     Social History   Tobacco Use   Smoking status: Some Days    Packs/day: 0.50    Years: 10.00    Pack years: 5.00    Types: Cigarettes    Last attempt to quit: 10/01/2013    Years since quitting: 7.1   Smokeless tobacco: Never  Vaping Use   Vaping Use: Former  Substance Use Topics   Alcohol use: Not Currently   Drug use: Not Currently    Types: Amphetamines    Home Medications Prior to Admission medications   Medication Sig Start Date End Date Taking? Authorizing Provider  ondansetron (ZOFRAN-ODT) 4 MG disintegrating tablet Take 1 tablet (4 mg total) by mouth every 8 (eight) hours as needed for nausea or vomiting. 11/24/20  Yes Carlisle Cater, PA-C  acetaminophen (TYLENOL) 500 MG tablet Take 1,000 mg by mouth daily as needed for mild pain.    [provider]  carvedilol (COREG) 3.125 MG tablet TAKE 1 TABLET(3.125 MG) BY MOUTH TWICE DAILY WITH A MEAL 06/29/20   Deboraha Sprang, MD  Cholecalciferol (VITAMIN D) 125 MCG (5000 UT) CAPS Take 1 capsule by mouth daily. 10/16/19   Altamese [place],  MD  EPINEPHrine (EPIPEN 2-PAK) 0.3 mg/0.3 mL IJ SOAJ injection Inject 0.3 mLs (0.3 mg total) into the muscle once as needed (anaphylaxis). 02/26/18   Lindell Spar I, NP  gabapentin (NEURONTIN) 300 MG capsule Take 300 mg by mouth 3 (three) times daily. 05/14/18   [provider]  lisinopril (ZESTRIL) 5 MG tablet TAKE 1 TABLET(5 MG) BY MOUTH DAILY 06/29/20   Deboraha Sprang, MD  PROAIR HFA 108 [phone] Base) MCG/ACT inhaler Inhale 2 puffs into the lungs every 6 (six) hours as needed for wheezing. 07/22/18   [provider]    Allergies    Iodine, Shellfish allergy, and Latex  Review of Systems   Review of Systems  Constitutional:  Positive for chills, fatigue and fever.  HENT:  Positive for congestion and sore  throat. Negative for ear pain, rhinorrhea and sinus pressure.   Eyes:  Negative for redness.  Respiratory:  Positive for cough. Negative for wheezing.   Gastrointestinal:  Positive for nausea and vomiting. Negative for abdominal pain and diarrhea.  Genitourinary:  Negative for dysuria.  Musculoskeletal:  Negative for myalgias and neck stiffness.  Skin:  Negative for rash.  Neurological:  Positive for headaches.  Hematological:  Negative for adenopathy.   Physical Exam Updated Vital Signs BP 128/86 (BP Location: Right Arm)   Pulse 95   Temp 99.5 F (37.5 C) (Oral)   Resp (!) 24   Ht 5\' 3"  (1.6 m)   Wt 59 kg   SpO2 100%   BMI 23.03 kg/m   Physical Exam Vitals and nursing note reviewed.  Constitutional:      Appearance: He is well-developed.  HENT:     Head: Normocephalic and atraumatic.     Jaw: No trismus.     Right Ear: Tympanic membrane, ear canal and external ear normal.     Left Ear: Tympanic membrane, ear canal and external ear normal.     Nose: Nose normal. No mucosal edema or rhinorrhea.     Mouth/Throat:     Mouth: Mucous membranes are not dry.     Pharynx: Uvula midline. No oropharyngeal exudate, posterior oropharyngeal erythema or uvula swelling.      Tonsils: No tonsillar abscesses.  Eyes:     General:        Right eye: No discharge.        Left eye: No discharge.     Conjunctiva/sclera: Conjunctivae normal.  Cardiovascular:     Rate and Rhythm: Normal rate and regular rhythm.     Heart sounds: Normal heart sounds.  Pulmonary:     Effort: Pulmonary effort is normal. No respiratory distress.     Breath sounds: Normal breath sounds. No wheezing or rales.  Abdominal:     Palpations: Abdomen is soft.     Tenderness: There is no abdominal tenderness.  Musculoskeletal:     Cervical back: Normal range of motion and neck supple.  Skin:    General: Skin is warm and dry.  Neurological:     Mental Status: He is alert.    ED Results / Procedures / Treatments   Labs (all labs ordered are listed, but only abnormal results are displayed) Labs Reviewed  RESP PANEL BY RT-PCR (FLU A&B, COVID) [id number] - Abnormal; Notable for the following components:      Result Value   Influenza A by PCR POSITIVE (*)    All other components within normal limits    EKG EKG Interpretation  Date/Time:  Thursday November 24 2020 18:03:14 EST Ventricular Rate:  105 PR Interval:  170 QRS Duration: 133 QT Interval:  375 QTC Calculation: 496 R Axis:   215 Text Interpretation: Sinus tachycardia ATRIAL SENSING VENTRICULAR PACING Non-specific intra-ventricular conduction delay Biatrial enlargement Since last tracing Rate faster Confirmed by Calvert Cantor [phone]) on 11/24/2020 6:05:55 PM  Radiology No results found.  Procedures Procedures   Medications Ordered in ED Medications - No data to display  ED Course  I have reviewed the triage vital signs and the nursing notes.  Pertinent labs & imaging results that were available during my care of the patient were reviewed by me and considered in my medical decision making (see chart for details).  Patient seen and examined.  Flu testing ordered.  Patient looks well, no distress.  Lungs are clear to  auscultation bilaterally.  He is eating crackers in the room.  Vital signs reviewed and are as follows: BP 128/86 (BP Location: Right Arm)   Pulse 95   Temp 99.5 F (37.5 C) (Oral)   Resp (!) 24   Ht 5\' 3"  (1.6 m)   Wt 59 kg   SpO2 100%   BMI 23.03 kg/m   Flu testing positive.  Will give prescription for Zofran.  Patient discharged to home. Encouraged to rest and drink plenty of fluids.  Patient told to return to ED or see their primary doctor if their symptoms worsen, high fever not controlled with tylenol, persistent vomiting, they feel they are dehydrated, or if they have any other concerns.  Patient verbalized understanding and agreed with plan.    Alias Villagran Richer was evaluated in Emergency Department on 11/24/2020 for the symptoms described in the history of present illness. He was evaluated in the context of the global COVID-19 pandemic, which necessitated consideration that the patient might be at risk for infection with the SARS-CoV-2 virus that causes COVID-19. Institutional protocols and algorithms that pertain to the evaluation of patients at risk for COVID-19 are in a state of rapid change based on information released by regulatory bodies including the CDC and federal and state organizations. These policies and algorithms were followed during the patient's care in the ED.     MDM Rules/Calculators/A&P                           Patient with symptoms consistent with influenza.  From a cardiac standpoint, no concerns.  Lungs are clear.  No concerns for failure or pneumonia at this time.  Vitals are stable, low-grade fever. No signs of dehydration, tolerating PO's. Lungs are clear. Supportive therapy indicated with return if symptoms worsen. Patient counseled.  Final Clinical Impression(s) / ED Diagnoses Final diagnoses:  Influenza A    Rx / DC Orders ED Discharge Orders          Ordered    ondansetron (ZOFRAN-ODT) 4 MG disintegrating tablet  Every 8 hours PRN         11/24/20 1912             11/26/20, PA-C 11/24/20 1916    11/26/20, MD 11/24/20 [phone]

## 2020-11-24 NOTE — Discharge Instructions (Signed)
Please read and follow all provided instructions.  Your diagnoses today include:  1. Influenza A     Tests performed today include: Flu/COVID test: POSITIVE for flu A Vital signs. See below for your results today.   Medications prescribed:  Zofran (ondansetron) - for nausea and vomiting  Take any prescribed medications only as directed.  Home care instructions:  Follow any educational materials contained in this packet. Please continue drinking plenty of fluids. Use over-the-counter cold and flu medications as needed as directed on packaging for symptom relief. You may also use ibuprofen or tylenol as directed on packaging for pain or fever.   BE VERY CAREFUL not to take multiple medicines containing Tylenol (also called acetaminophen). Doing so can lead to an overdose which can damage your liver and cause liver failure and possibly death.   Follow-up instructions: Please follow-up with your primary care provider in the next 3 days for further evaluation of your symptoms.   Return instructions:  Please return to the Emergency Department if you experience worsening symptoms. Please return if you have a high fever greater than 101 degrees not controlled with over-the-counter medications, persistent vomiting and cannot keep down fluids, or worsening trouble breathing. Please return if you have any other emergent concerns.  Additional Information:  Your vital signs today were: BP 128/86 (BP Location: Right Arm)   Pulse 95   Temp 99.5 F (37.5 C) (Oral)   Resp (!) 24   Ht 5\' 3"  (1.6 m)   Wt 59 kg   SpO2 100%   BMI 23.03 kg/m  If your blood pressure (BP) was elevated above 135/85 this visit, please have this repeated by your doctor within one month.

## 2020-12-25 ENCOUNTER — Other Ambulatory Visit: Payer: Self-pay

## 2020-12-25 ENCOUNTER — Emergency Department (HOSPITAL_COMMUNITY)
Admission: EM | Admit: 2020-12-25 | Discharge: 2020-12-26 | Disposition: A | Discharge status: Home / Self Care | Payer: Medicaid Other | Attending: Emergency Medicine | Admitting: Emergency Medicine

## 2020-12-25 DIAGNOSIS — F17210 Nicotine dependence, cigarettes, uncomplicated: Secondary | ICD-10-CM | POA: Insufficient documentation

## 2020-12-25 DIAGNOSIS — J4520 Mild intermittent asthma, uncomplicated: Secondary | ICD-10-CM | POA: Diagnosis not present

## 2020-12-25 DIAGNOSIS — I5022 Chronic systolic (congestive) heart failure: Secondary | ICD-10-CM | POA: Insufficient documentation

## 2020-12-25 DIAGNOSIS — Z91040 Latex allergy status: Secondary | ICD-10-CM | POA: Insufficient documentation

## 2020-12-25 DIAGNOSIS — L0291 Cutaneous abscess, unspecified: Secondary | ICD-10-CM

## 2020-12-25 DIAGNOSIS — L02512 Cutaneous abscess of left hand: Secondary | ICD-10-CM | POA: Diagnosis present

## 2020-12-25 DIAGNOSIS — Z79899 Other long term (current) drug therapy: Secondary | ICD-10-CM | POA: Insufficient documentation

## 2020-12-25 NOTE — ED Triage Notes (Signed)
Pt c/o pain from an abscess on his left hand.

## 2020-12-26 MED ORDER — SULFAMETHOXAZOLE-TRIMETHOPRIM 800-160 MG PO TABS: 1.0000 | ORAL_TABLET | Freq: Two times a day (BID) | ORAL | 0 refills | Status: AC

## 2020-12-26 MED ORDER — SULFAMETHOXAZOLE-TRIMETHOPRIM 800-160 MG PO TABS
1.0000 | ORAL_TABLET | Freq: Once | ORAL | Status: AC
  Administered 2020-12-26: 09:00:00 1 via ORAL
  Filled 2020-12-26: qty 1

## 2020-12-26 NOTE — ED Provider Notes (Signed)
[facility] EMERGENCY DEPARTMENT Provider Note   CSN: [id number] Arrival date & time: 12/25/20  2109     History Chief Complaint  Patient presents with   Abscess    Mario Wyatt is a 30 y.o. male.  30 yo M with a chief complaints of a spot to the back of his left hand.  Been going on for about 24 hours.  Denies any injury to the area.  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  No fevers or chills.  Has had some whitish drainage to the area.  The history is provided by the patient.  Abscess Location:  Hand Hand abscess location:  Dorsum of L hand Size:  Mar[place] Abscess quality: draining and fluctuance   Red streaking: no   Duration:  2 days Progression:  Worsening Chronicity:  New Relieved by:  Nothing Worsened by:  Nothing Ineffective treatments:  None tried Associated symptoms: no fever, no headaches and no vomiting       Past Medical History:  Diagnosis Date   Anxiety    Arthritis    "wrists, knees; probably all over my body" (04/05/2017)   Asthma    Attention deficit hyperactivity disorder (ADHD)    pt denies this hx on 04/05/2017   Cardiomyopathy ([place])    Chronic back pain    "right neck to lower back; shoulders" (04/05/2017)   Congenital complete AV block    Daily headache    GERD (gastroesophageal reflux disease)    Macular degeneration    bilateral   Migraine    "a couple/month" (04/05/2017)   Nicotine dependence 10/14/2019   Pacemaker    Placed when pt was 29 years old.   S/P cardiac pacemaker procedure, 09/08/14, [employer] Jude medical 09/09/2014   Vitamin D deficiency 10/14/2019   Wears glasses     Patient Active Problem List   Diagnosis Date Noted   Nicotine dependence 10/14/2019   Vitamin D deficiency 10/14/2019   Anxiety    Attention deficit hyperactivity disorder (ADHD)    Congenital complete AV block    GERD (gastroesophageal reflux disease)    Closed displaced intraarticular fracture of right calcaneus  10/13/2019   NICM (nonischemic cardiomyopathy) ([place]) 05/11/2019   Delusional disorder ([place]) 02/26/2018   Psychosis ([place]) 02/24/2018   Closed left maxillary fracture ([place])    Acute psychosis ([place]) 02/18/2018   Acute respiratory insufficiency    Head trauma    Foreign body alimentary tract, subsequent encounter    Gastric foreign body, initial encounter    Cervical compression fracture, sequela 10/28/2017   Chronic myofascial pain 04/17/2017   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   Chronic systolic heart failure ([place]) 04/04/2017   Mild intermittent asthma without complication 0000000   Asthma 07/03/2016   Shellfish allergy 09/29/2014   Tired 09/29/2014   Pacemaker at end of battery life 09/09/2014   S/P cardiac pacemaker procedure, 09/08/14, [employer] Jude medical, gen change 09/09/2014   Complete heart block ([place]) 09/08/2014   Adult ADHD 05/26/2014   Cervical pain 05/26/2014   Chronic back pain 05/26/2014   Neck pain 05/26/2014   AV block, High Grade, congenital 06/11/2013   Congenital complete atrioventricular block 06/11/2013   Pacemaker 05/27/2013   Vomiting 04/05/2013   Diarrhea 04/05/2013   Nausea 04/05/2013   Artificial cardiac pacemaker 03/21/2011   2nd degree atrioventricular block 03/21/2011   Second degree AV block 03/21/2011   Presence of cardiac pacemaker 03/21/2011   Delusional  disorder(297.1) 03/15/2011    Class: Acute    Past Surgical History:  Procedure Laterality Date   CARDIAC PACEMAKER PLACEMENT  2007   second degree heart block   EP IMPLANTABLE DEVICE N/A 09/08/2014   Procedure: Pacemaker Implant;  Surgeon: Duke Salvia, MD;  Location: [facility] INVASIVE CV LAB;  Service: Cardiovascular;  Laterality: N/A;   ESOPHAGOGASTRODUODENOSCOPY N/A 02/18/2018   Procedure: ESOPHAGOGASTRODUODENOSCOPY (EGD);  Surgeon: Meryl Dare, MD;  Location: [facility] ENDOSCOPY;  Service: Endoscopy;  Laterality: N/A;   FOREIGN BODY REMOVAL N/A 02/18/2018   Procedure: FOREIGN BODY REMOVAL;   Surgeon: Meryl Dare, MD;  Location: [facility] ENDOSCOPY;  Service: Endoscopy;  Laterality: N/A;   HERNIA REPAIR     ORIF CALCANEOUS FRACTURE Right 10/13/2019   Procedure: OPEN REDUCTION INTERNAL FIXATION (ORIF) CALCANEOUS FRACTURE;  Surgeon: Myrene Galas, MD;  Location: [facility] OR;  Service: Orthopedics;  Laterality: Right;   PACEMAKER INSERTION  1999   PACEMAKER LEAD REMOVAL N/A 04/04/2017   Procedure: LEAD EXTRACTION AND NEW LEAD PLACEMENT AND CHANGE OUT OF OLD PACEMAKER;  Surgeon: Marinus Maw, MD;  Location: [facility] OR;  Service: Cardiovascular;  Laterality: N/A;   PERMANENT PACEMAKER GENERATOR CHANGE  04/04/2017   LEAD EXTRACTION AND NEW LEAD PLACEMENT AND CHANGE OUT OF OLD PACEMAKER   UMBILICAL HERNIA REPAIR     "when I was a child"       Family History  Problem Relation Age of Onset   Hypertension Mother    Diabetes Mother    Bipolar disorder Mother    Drug abuse Mother    Glaucoma Father    Anesthesia problems Neg Hx    Hypotension Neg Hx    Malignant hyperthermia Neg Hx    Pseudochol deficiency Neg Hx     Social History   Tobacco Use   Smoking status: Some Days    Packs/day: 0.50    Years: 10.00    Pack years: 5.00    Types: Cigarettes    Last attempt to quit: 10/01/2013    Years since quitting: 7.2   Smokeless tobacco: Never  Vaping Use   Vaping Use: Former  Substance Use Topics   Alcohol use: Not Currently   Drug use: Not Currently    Types: Amphetamines    Home Medications Prior to Admission medications   Medication Sig Start Date End Date Taking? Authorizing Provider  sulfamethoxazole-trimethoprim (BACTRIM DS) 800-160 MG tablet Take 1 tablet by mouth 2 (two) times daily for 7 days. 12/26/20 01/02/21 Yes Melene Plan, DO  acetaminophen (TYLENOL) 500 MG tablet Take 1,000 mg by mouth daily as needed for mild pain.    [provider]  carvedilol (COREG) 3.125 MG tablet TAKE 1 TABLET(3.125 MG) BY MOUTH TWICE DAILY WITH A MEAL 06/29/20   Duke Salvia, MD   Cholecalciferol (VITAMIN D) 125 MCG (5000 UT) CAPS Take 1 capsule by mouth daily. 10/16/19   Myrene Galas, MD  EPINEPHrine (EPIPEN 2-PAK) 0.3 mg/0.3 mL IJ SOAJ injection Inject 0.3 mLs (0.3 mg total) into the muscle once as needed (anaphylaxis). 02/26/18   Armandina Stammer I, NP  gabapentin (NEURONTIN) 300 MG capsule Take 300 mg by mouth 3 (three) times daily. 05/14/18   [provider]  lisinopril (ZESTRIL) 5 MG tablet TAKE 1 TABLET(5 MG) BY MOUTH DAILY 06/29/20   Duke Salvia, MD  ondansetron (ZOFRAN-ODT) 4 MG disintegrating tablet Take 1 tablet (4 mg total) by mouth every 8 (eight) hours as needed for nausea or vomiting. 11/24/20  Suann Larry  [facility] HFA 108 [phone] Base) MCG/ACT inhaler Inhale 2 puffs into the lungs every 6 (six) hours as needed for wheezing. 07/22/18   [provider]    Allergies    Iodine, Shellfish allergy, and Latex  Review of Systems   Review of Systems  Constitutional:  Negative for chills and fever.  HENT:  Negative for congestion and facial swelling.   Eyes:  Negative for discharge and visual disturbance.  Respiratory:  Negative for shortness of breath.   Cardiovascular:  Negative for chest pain and palpitations.  Gastrointestinal:  Negative for abdominal pain, diarrhea and vomiting.  Musculoskeletal:  Negative for arthralgias and myalgias.  Skin:  Positive for color change and wound. Negative for rash.  Neurological:  Negative for tremors, syncope and headaches.  Psychiatric/Behavioral:  Negative for confusion and dysphoric mood.    Physical Exam Updated Vital Signs BP 105/82    Pulse 75    Temp 98 F (36.7 C) (Oral)    Resp 16    Ht 5\' 5"  (1.651 m)    Wt 59 kg    SpO2 97%    BMI 21.63 kg/m   Physical Exam Vitals and nursing note reviewed.  Constitutional:      Appearance: He is well-developed.  HENT:     Head: Normocephalic and atraumatic.  Eyes:     Pupils: Pupils are equal, round, and reactive to light.  Neck:      Vascular: No JVD.  Cardiovascular:     Rate and Rhythm: Normal rate and regular rhythm.     Heart sounds: No murmur heard.   No friction rub. No gallop.  Pulmonary:     Effort: No respiratory distress.     Breath sounds: No wheezing.  Abdominal:     General: There is no distension.     Tenderness: There is no abdominal tenderness. There is no guarding or rebound.  Musculoskeletal:        General: Swelling and tenderness present. Normal range of motion.     Cervical back: Normal range of motion and neck supple.     Comments: Erythema warmth and purulent drainage noted to the dorsum of the left hand.  Mild fluctuance surrounding able to express without issue.  Skin:    Coloration: Skin is not pale.     Findings: No rash.  Neurological:     Mental Status: He is alert and oriented to person, place, and time.  Psychiatric:        Behavior: Behavior normal.    ED Results / Procedures / Treatments   Labs (all labs ordered are listed, but only abnormal results are displayed) Labs Reviewed - No data to display  EKG None  Radiology No results found.  Procedures Procedures   Medications Ordered in ED Medications  sulfamethoxazole-trimethoprim (BACTRIM DS) 800-160 MG per tablet 1 tablet (has no administration in time range)    ED Course  I have reviewed the triage vital signs and the nursing notes.  Pertinent labs & imaging results that were available during my care of the patient were reviewed by me and considered in my medical decision making (see chart for details).    MDM Rules/Calculators/A&P                         30 yo M with a chief complaints of an abscess.  Going on for about 48 hours.  Wound is already open and draining.  He  is well-appearing and nontoxic.  Will start on oral antibiotics.  Warm compresses.  PCP follow-up.  8:40 AM:  I have discussed the diagnosis/risks/treatment options with the patient and believe the pt to be eligible for discharge home to  follow-up with PCP. We also discussed returning to the ED immediately if new or worsening sx occur. We discussed the sx which are most concerning (e.g., sudden worsening pain, fever, inability to tolerate by mouth) that necessitate immediate return. Medications administered to the patient during their visit and any new prescriptions provided to the patient are listed below.  Medications given during this visit Medications  sulfamethoxazole-trimethoprim (BACTRIM DS) 800-160 MG per tablet 1 tablet (has no administration in time range)     The patient appears reasonably screen and/or stabilized for discharge and I doubt any other medical condition or other [facility] requiring further screening, evaluation, or treatment in the ED at this time prior to discharge.      Final Clinical Impression(s) / ED Diagnoses Final diagnoses:  Abscess    Rx / DC Orders ED Discharge Orders          Ordered    sulfamethoxazole-trimethoprim (BACTRIM DS) 800-160 MG tablet  2 times daily        12/26/20 0825             Deno Etienne, DO 12/26/20 0840

## 2020-12-26 NOTE — Discharge Instructions (Signed)
Warm compresses at least 4 times a day.  Please return for rapid spreading redness or if you develop a fever. 

## 2020-12-29 ENCOUNTER — Ambulatory Visit (INDEPENDENT_AMBULATORY_CARE_PROVIDER_SITE_OTHER): Payer: Medicaid Other

## 2020-12-29 DIAGNOSIS — I442 Atrioventricular block, complete: Secondary | ICD-10-CM | POA: Diagnosis not present

## 2020-12-29 LAB — CUP PACEART REMOTE DEVICE CHECK
Battery Remaining Longevity: 32 mo
Battery Remaining Percentage: 42 %
Battery Voltage: 2.95 V
Brady Statistic AP VP Percent: 31 %
Brady Statistic AP VS Percent: 1 %
Brady Statistic AS VP Percent: 69 %
Brady Statistic AS VS Percent: 1 %
Brady Statistic RA Percent Paced: 31 %
Date Time Interrogation Session: 20221229084812
Implantable Lead Implant Date: 20160907
Implantable Lead Implant Date: 20160907
Implantable Lead Implant Date: 20190404
Implantable Lead Location: 753858
Implantable Lead Location: 753859
Implantable Lead Location: 753860
Implantable Lead Model: 1948
Implantable Pulse Generator Implant Date: 20190404
Lead Channel Impedance Value: 460 Ohm
Lead Channel Impedance Value: 560 Ohm
Lead Channel Impedance Value: 790 Ohm
Lead Channel Pacing Threshold Amplitude: 0.625 V
Lead Channel Pacing Threshold Amplitude: 0.875 V
Lead Channel Pacing Threshold Amplitude: 1.625 V
Lead Channel Pacing Threshold Pulse Width: 0.5 ms
Lead Channel Pacing Threshold Pulse Width: 0.5 ms
Lead Channel Pacing Threshold Pulse Width: 0.5 ms
Lead Channel Sensing Intrinsic Amplitude: 2.9 mV
Lead Channel Sensing Intrinsic Amplitude: 9.5 mV
Lead Channel Setting Pacing Amplitude: 1.875
Lead Channel Setting Pacing Amplitude: 2 V
Lead Channel Setting Pacing Amplitude: 2.625
Lead Channel Setting Pacing Pulse Width: 0.5 ms
Lead Channel Setting Pacing Pulse Width: 0.5 ms
Lead Channel Setting Sensing Sensitivity: 2.5 mV
Pulse Gen Model: 3262
Pulse Gen Serial Number: 9003554

## 2021-01-10 NOTE — Progress Notes (Signed)
Remote pacemaker transmission.   

## 2021-03-06 ENCOUNTER — Other Ambulatory Visit (HOSPITAL_COMMUNITY): Payer: Self-pay

## 2021-03-30 ENCOUNTER — Ambulatory Visit (INDEPENDENT_AMBULATORY_CARE_PROVIDER_SITE_OTHER): Payer: Medicaid Other

## 2021-03-30 DIAGNOSIS — I442 Atrioventricular block, complete: Secondary | ICD-10-CM | POA: Diagnosis not present

## 2021-03-30 LAB — CUP PACEART REMOTE DEVICE CHECK
Battery Remaining Longevity: 30 mo
Battery Remaining Percentage: 38 %
Battery Voltage: 2.93 V
Brady Statistic AP VP Percent: 31 %
Brady Statistic AP VS Percent: 1 %
Brady Statistic AS VP Percent: 68 %
Brady Statistic AS VS Percent: 1 %
Brady Statistic RA Percent Paced: 31 %
Date Time Interrogation Session: 20230330043012
Implantable Lead Implant Date: 20160907
Implantable Lead Implant Date: 20160907
Implantable Lead Implant Date: 20190404
Implantable Lead Location: 753858
Implantable Lead Location: 753859
Implantable Lead Location: 753860
Implantable Lead Model: 1948
Implantable Pulse Generator Implant Date: 20190404
Lead Channel Impedance Value: 490 Ohm
Lead Channel Impedance Value: 600 Ohm
Lead Channel Impedance Value: 830 Ohm
Lead Channel Pacing Threshold Amplitude: 0.75 V
Lead Channel Pacing Threshold Amplitude: 0.75 V
Lead Channel Pacing Threshold Amplitude: 2.5 V
Lead Channel Pacing Threshold Pulse Width: 0.5 ms
Lead Channel Pacing Threshold Pulse Width: 0.5 ms
Lead Channel Pacing Threshold Pulse Width: 0.5 ms
Lead Channel Sensing Intrinsic Amplitude: 1.4 mV
Lead Channel Sensing Intrinsic Amplitude: 8.1 mV
Lead Channel Setting Pacing Amplitude: 1.75 V
Lead Channel Setting Pacing Amplitude: 2 V
Lead Channel Setting Pacing Amplitude: 3.5 V
Lead Channel Setting Pacing Pulse Width: 0.5 ms
Lead Channel Setting Pacing Pulse Width: 0.5 ms
Lead Channel Setting Sensing Sensitivity: 2.5 mV
Pulse Gen Model: 3262
Pulse Gen Serial Number: 9003554

## 2021-04-11 NOTE — Progress Notes (Signed)
Remote pacemaker transmission.   

## 2021-06-08 ENCOUNTER — Telehealth: Payer: Self-pay | Admitting: Internal Medicine

## 2021-06-08 MED ORDER — CARVEDILOL 3.125 MG PO TABS: ORAL_TABLET | ORAL | 0 refills | Status: DC

## 2021-06-08 MED ORDER — LISINOPRIL 5 MG PO TABS: ORAL_TABLET | ORAL | 0 refills | Status: DC

## 2021-06-08 NOTE — Telephone Encounter (Signed)
Pt c/o of Chest Pain: STAT if CP now or developed within 24 hours  1. Are you having CP right now? yes  2. Are you experiencing any other symptoms (ex. SOB, nausea, vomiting, sweating)?  Real fatigued  3. How long have you been experiencing CP?  4. Is your CP continuous or coming and going?   5. Have you taken Nitroglycerin?  ?

## 2021-06-08 NOTE — Telephone Encounter (Signed)
Pt reports CP that started yesterday evening. "Feels like someone hit me w/ blunt force object" He also reports that he is falling asleep very fatigued due to the outside air quality. He does not wish to go to ED b/c "he will be there for hours". Scheduled pt to be seen tomorrow by EP PA for further discussion/evaluation.  Pt also asking for Lisinopril & Carvedilol refills. States he has been out of both of these for 2 weeks. Aware this may be why he is having elevated HRs. Informed I would send in 30 day supply for each and to inform them at tomorrow's OV for more refills if continuing both.  Patient verbalized understanding and agreeable to plan.

## 2021-06-08 NOTE — Progress Notes (Deleted)
Electrophysiology Office Note Date: 06/08/2021  ID:  Mario Wyatt, DOB [birth year], MRN [id number]  PCP: Verlon Au, MD Primary Cardiologist: None Electrophysiologist: None ***  CC: Pacemaker follow-up  Mario Wyatt is a 31 y.o. male seen today for None for {Blank single:19197::"cardiac clearance","post hospital follow up","acute visit due to ***","routine electrophysiology followup"}.  Since {Blank single:19197::"last being seen in our clinic","discharge from hospital"} the patient reports doing ***.  he denies chest pain, palpitations, dyspnea, PND, orthopnea, nausea, vomiting, dizziness, syncope, edema, weight gain, or early satiety.  Device History: SJM dual chamber PPM, gen change/new leads as noted above, 09/08/14.   Original implant at age 21, gen change 2008, 2016.  >>> development of NICM Extraction of old/abandoned RA/RV leads and implant of LV lead, upgrade to [facility] CRT-P April 04, 2017 DEVICE DEPENDENT  Past Medical History:  Diagnosis Date   Anxiety    Arthritis    "wrists, knees; probably all over my body" (04/05/2017)   Asthma    Attention deficit hyperactivity disorder (ADHD)    pt denies this hx on 04/05/2017   Cardiomyopathy ([facility])    Chronic back pain    "right neck to lower back; shoulders" (04/05/2017)   Congenital complete AV block    Daily headache    GERD (gastroesophageal reflux disease)    Macular degeneration    bilateral   Migraine    "a couple/month" (04/05/2017)   Nicotine dependence 10/14/2019   Pacemaker    Placed when pt was 31 years old.   S/P cardiac pacemaker procedure, 09/08/14, St Jude medical 09/09/2014   Vitamin D deficiency 10/14/2019   Wears glasses    Past Surgical History:  Procedure Laterality Date   CARDIAC PACEMAKER PLACEMENT  2007   second degree heart block   EP IMPLANTABLE DEVICE N/A 09/08/2014   Procedure: Pacemaker Implant;  Surgeon: Duke Salvia, MD;  Location: [facility] INVASIVE CV LAB;  Service: Cardiovascular;   Laterality: N/A;   ESOPHAGOGASTRODUODENOSCOPY N/A 02/18/2018   Procedure: ESOPHAGOGASTRODUODENOSCOPY (EGD);  Surgeon: Meryl Dare, MD;  Location: [facility] ENDOSCOPY;  Service: Endoscopy;  Laterality: N/A;   FOREIGN BODY REMOVAL N/A 02/18/2018   Procedure: FOREIGN BODY REMOVAL;  Surgeon: Meryl Dare, MD;  Location: [facility] ENDOSCOPY;  Service: Endoscopy;  Laterality: N/A;   HERNIA REPAIR     ORIF CALCANEOUS FRACTURE Right 10/13/2019   Procedure: OPEN REDUCTION INTERNAL FIXATION (ORIF) CALCANEOUS FRACTURE;  Surgeon: Myrene Galas, MD;  Location: [facility] OR;  Service: Orthopedics;  Laterality: Right;   PACEMAKER INSERTION  1999   PACEMAKER LEAD REMOVAL N/A 04/04/2017   Procedure: LEAD EXTRACTION AND NEW LEAD PLACEMENT AND CHANGE OUT OF OLD PACEMAKER;  Surgeon: Marinus Maw, MD;  Location: [facility] OR;  Service: Cardiovascular;  Laterality: N/A;   PERMANENT PACEMAKER GENERATOR CHANGE  04/04/2017   LEAD EXTRACTION AND NEW LEAD PLACEMENT AND CHANGE OUT OF OLD PACEMAKER   UMBILICAL HERNIA REPAIR     "when I was a child"    Current Outpatient Medications  Medication Sig Dispense Refill   acetaminophen (TYLENOL) 500 MG tablet Take 1,000 mg by mouth daily as needed for mild pain.     carvedilol (COREG) 3.125 MG tablet TAKE 1 TABLET(3.125 MG) BY MOUTH TWICE DAILY WITH A MEAL 60 tablet 0   Cholecalciferol (VITAMIN D) 125 MCG (5000 UT) CAPS Take 1 capsule by mouth daily. 30 capsule 6   EPINEPHrine (EPIPEN 2-PAK) 0.3 mg/0.3 mL IJ SOAJ injection Inject 0.3 mLs (0.3 mg total) into the  muscle once as needed (anaphylaxis). 1 Device 0   gabapentin (NEURONTIN) 300 MG capsule Take 300 mg by mouth 3 (three) times daily.     lisinopril (ZESTRIL) 5 MG tablet TAKE 1 TABLET(5 MG) BY MOUTH DAILY 30 tablet 0   ondansetron (ZOFRAN-ODT) 4 MG disintegrating tablet Take 1 tablet (4 mg total) by mouth every 8 (eight) hours as needed for nausea or vomiting. 10 tablet 0   PROAIR HFA 108 (90 Base) MCG/ACT inhaler Inhale 2 puffs into the  lungs every 6 (six) hours as needed for wheezing.     No current facility-administered medications for this visit.    Allergies:   Iodine, Shellfish allergy, and Latex   Social History: Social History   Socioeconomic History   Marital status: Single    Spouse name: Not on file   Number of children: Not on file   Years of education: Not on file   Highest education level: Not on file  Occupational History   Occupation: Unemployed  Tobacco Use   Smoking status: Some Days    Packs/day: 0.50    Years: 10.00    Total pack years: 5.00    Types: Cigarettes    Last attempt to quit: 10/01/2013    Years since quitting: 7.6   Smokeless tobacco: Never  Vaping Use   Vaping Use: Former  Substance and Sexual Activity   Alcohol use: Not Currently   Drug use: Not Currently    Types: Amphetamines   Sexual activity: Yes  Other Topics Concern   Not on file  Social History Narrative   Pt stated that he lives alone in [place] and that he is unemployed.     Social Determinants of Health   Financial Resource Strain: Not on file  Food Insecurity: Not on file  Transportation Needs: Not on file  Physical Activity: Not on file  Stress: Not on file  Social Connections: Not on file  Intimate Partner Violence: Not on file    Family History: Family History  Problem Relation Age of Onset   Hypertension Mother    Diabetes Mother    Bipolar disorder Mother    Drug abuse Mother    Glaucoma Father    Anesthesia problems Neg Hx    Hypotension Neg Hx    Malignant hyperthermia Neg Hx    Pseudochol deficiency Neg Hx      Review of Systems: All other systems reviewed and are otherwise negative except as noted above.  Physical Exam: There were no vitals filed for this visit.   GEN- The patient is well appearing, alert and oriented x 3 today.   HEENT: normocephalic, atraumatic; sclera clear, conjunctiva pink; hearing intact; oropharynx clear; neck supple  Lungs- Clear to ausculation  bilaterally, normal work of breathing.  No wheezes, rales, rhonchi Heart- Regular rate and rhythm, no murmurs, rubs or gallops  GI- soft, non-tender, non-distended, bowel sounds present  Extremities- no clubbing or cyanosis. No edema MS- no significant deformity or atrophy Skin- warm and dry, no rash or lesion; PPM pocket well healed Psych- euthymic mood, full affect Neuro- strength and sensation are intact  PPM Interrogation- reviewed in detail today,  See PACEART report  EKG:  EKG {ACTION; IS/IS ION:[id number]} ordered today. Personal review of ekg ordered {Blank single:19197::"today","***"} shows ***   Recent Labs: No results found for requested labs within last 365 days.   Wt Readings from Last 3 Encounters:  12/25/20 130 lb (59 kg)  11/24/20 130 lb (59 kg)  05/19/20  121 lb 12.8 oz (55.2 kg)     Other studies Reviewed: Additional studies/ records that were reviewed today include: Previous EP office notes, Previous remote checks, Most recent labwork.   Assessment and Plan:    1. High degree AVblock, PPM 2. 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  Volume status stable today.  Continue coreg and lisinopril    3. AMS episodes ***   4. Fatigue In the setting of poor air quality and running out of medications.  We will refill medications, update echo, and see how he is doing then.          Current medicines are reviewed at length with the patient today.    Labs/ tests ordered today include: *** No orders of the defined types were placed in this encounter.    Disposition:   Follow up with {Blank single:19197::"Dr. Allred","Dr. Amada Jupiter. Klein","Dr. Camnitz","Dr. Lambert","EP APP"} in {Blank single:19197::"2 weeks","4 weeks","3 months","6 months","12 months","as usual post gen change"}    Signed, Luane School  06/08/2021 3:39 PM  [facility] [address] [place] [address] [phone]  (office) [phone] (fax)

## 2021-06-09 ENCOUNTER — Encounter: Payer: Medicaid Other | Admitting: Student

## 2021-06-26 ENCOUNTER — Telehealth: Payer: Self-pay | Admitting: Internal Medicine

## 2021-06-28 ENCOUNTER — Encounter: Payer: Self-pay | Admitting: Physician Assistant

## 2021-06-28 ENCOUNTER — Ambulatory Visit (INDEPENDENT_AMBULATORY_CARE_PROVIDER_SITE_OTHER): Payer: Medicaid Other | Admitting: Physician Assistant

## 2021-06-28 VITALS — BP 120/40 | HR 84 | Ht 64.0 in | Wt 120.2 lb

## 2021-06-28 DIAGNOSIS — I428 Other cardiomyopathies: Secondary | ICD-10-CM

## 2021-06-28 DIAGNOSIS — I442 Atrioventricular block, complete: Secondary | ICD-10-CM

## 2021-06-28 DIAGNOSIS — Z950 Presence of cardiac pacemaker: Secondary | ICD-10-CM

## 2021-06-28 LAB — CUP PACEART INCLINIC DEVICE CHECK
Battery Remaining Longevity: 22 mo
Battery Voltage: 2.93 V
Brady Statistic RA Percent Paced: 31 %
Brady Statistic RV Percent Paced: 99.54 %
Date Time Interrogation Session: 20230628191049
Implantable Lead Implant Date: 20160907
Implantable Lead Implant Date: 20160907
Implantable Lead Implant Date: 20190404
Implantable Lead Location: 753858
Implantable Lead Location: 753859
Implantable Lead Location: 753860
Implantable Lead Model: 1948
Implantable Pulse Generator Implant Date: 20190404
Lead Channel Impedance Value: 462.5 Ohm
Lead Channel Impedance Value: 550 Ohm
Lead Channel Impedance Value: 762.5 Ohm
Lead Channel Pacing Threshold Amplitude: 0.625 V
Lead Channel Pacing Threshold Amplitude: 0.75 V
Lead Channel Pacing Threshold Amplitude: 2.25 V
Lead Channel Pacing Threshold Pulse Width: 0.5 ms
Lead Channel Pacing Threshold Pulse Width: 0.5 ms
Lead Channel Pacing Threshold Pulse Width: 0.5 ms
Lead Channel Sensing Intrinsic Amplitude: 4.5 mV
Lead Channel Sensing Intrinsic Amplitude: 7.8 mV
Lead Channel Setting Pacing Amplitude: 1.75 V
Lead Channel Setting Pacing Amplitude: 2 V
Lead Channel Setting Pacing Amplitude: 3.25 V
Lead Channel Setting Pacing Pulse Width: 0.5 ms
Lead Channel Setting Pacing Pulse Width: 0.5 ms
Lead Channel Setting Sensing Sensitivity: 2.5 mV
Pulse Gen Model: 3262
Pulse Gen Serial Number: 9003554

## 2021-06-28 NOTE — Addendum Note (Signed)
Addended by: Burnetta Sabin on: 06/28/2021 01:28 PM   Modules accepted: Orders
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
## 2021-06-28 NOTE — Patient Instructions (Signed)
Medication Instructions:  Your physician recommends that you continue on your current medications as directed. Please refer to the Current Medication list given to you today.  *If you need a refill on your cardiac medications before your next appointment, please call your pharmacy*   Lab Work: None ordered  If you have labs (blood work) drawn today and your tests are completely normal, you will receive your results only by: MyChart Message (if you have MyChart) OR A paper copy in the mail If you have any lab test that is abnormal or we need to change your treatment, we will call you to review the results.   Testing/Procedures: None ordered   Follow-Up: At [facility], you and your health needs are our priority.  As part of our continuing mission to provide you with exceptional heart care, we have created designated Provider Care Teams.  These Care Teams include your primary Cardiologist (physician) and Advanced Practice Providers (APPs -  Physician Assistants and Nurse Practitioners) who all work together to provide you with the care you need, when you need it.  We recommend signing up for the patient portal called "MyChart".  Sign up information is provided on this After Visit Summary.  MyChart is used to connect with patients for Virtual Visits (Telemedicine).  Patients are able to view lab/test results, encounter notes, upcoming appointments, etc.  Non-urgent messages can be sent to your provider as well.   To learn more about what you can do with MyChart, go to [email or web address].    Your next appointment:   1 year(s)  The format for your next appointment:   In Person  Provider:   Sherryl Manges, MD    Other Instructions   Important Information About Sugar

## 2021-06-29 ENCOUNTER — Encounter: Payer: Medicaid Other | Admitting: Physician Assistant

## 2021-06-29 ENCOUNTER — Other Ambulatory Visit: Payer: Medicaid Other

## 2021-06-29 ENCOUNTER — Ambulatory Visit (INDEPENDENT_AMBULATORY_CARE_PROVIDER_SITE_OTHER): Payer: Medicaid Other

## 2021-06-29 DIAGNOSIS — I442 Atrioventricular block, complete: Secondary | ICD-10-CM

## 2021-06-29 LAB — CUP PACEART REMOTE DEVICE CHECK
Battery Remaining Longevity: 27 mo
Battery Remaining Percentage: 33 %
Battery Voltage: 2.93 V
Brady Statistic AP VP Percent: 28 %
Brady Statistic AP VS Percent: 1 %
Brady Statistic AS VP Percent: 72 %
Brady Statistic AS VS Percent: 1 %
Brady Statistic RA Percent Paced: 24 %
Date Time Interrogation Session: 20230629050943
Implantable Lead Implant Date: 20160907
Implantable Lead Implant Date: 20160907
Implantable Lead Implant Date: 20190404
Implantable Lead Location: 753858
Implantable Lead Location: 753859
Implantable Lead Location: 753860
Implantable Lead Model: 1948
Implantable Pulse Generator Implant Date: 20190404
Lead Channel Impedance Value: 460 Ohm
Lead Channel Impedance Value: 560 Ohm
Lead Channel Impedance Value: 800 Ohm
Lead Channel Pacing Threshold Amplitude: 0.625 V
Lead Channel Pacing Threshold Amplitude: 0.75 V
Lead Channel Pacing Threshold Amplitude: 2 V
Lead Channel Pacing Threshold Pulse Width: 0.5 ms
Lead Channel Pacing Threshold Pulse Width: 0.5 ms
Lead Channel Pacing Threshold Pulse Width: 0.5 ms
Lead Channel Sensing Intrinsic Amplitude: 1.8 mV
Lead Channel Sensing Intrinsic Amplitude: 7.8 mV
Lead Channel Setting Pacing Amplitude: 1.625
Lead Channel Setting Pacing Amplitude: 2 V
Lead Channel Setting Pacing Amplitude: 3 V
Lead Channel Setting Pacing Pulse Width: 0.5 ms
Lead Channel Setting Pacing Pulse Width: 0.5 ms
Lead Channel Setting Sensing Sensitivity: 2.5 mV
Pulse Gen Model: 3262
Pulse Gen Serial Number: 9003554

## 2021-06-30 LAB — BASIC METABOLIC PANEL
BUN/Creatinine Ratio: 17 (ref 9–20)
BUN: 18 mg/dL (ref 6–20)
CO2: 24 mmol/L (ref 20–29)
Calcium: 9.2 mg/dL (ref 8.7–10.2)
Chloride: 103 mmol/L (ref 96–106)
Creatinine, Ser: 1.03 mg/dL (ref 0.76–1.27)
Glucose: 135 mg/dL — ABNORMAL HIGH (ref 70–99)
Potassium: 4.7 mmol/L (ref 3.5–5.2)
Sodium: 141 mmol/L (ref 134–144)
eGFR: 100 mL/min/{1.73_m2} (ref >59)

## 2021-06-30 NOTE — Telephone Encounter (Signed)
Pt states he would like for [name] U, pa to write him a note about their visit so he can present it to the judge. He wants her to give him a call back. His phone number is [phone].
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
## 2021-07-05 NOTE — Telephone Encounter (Signed)
I have printed off pt's last o/v and put in envelope at front desk for him to come by and pick up.

## 2021-07-06 ENCOUNTER — Encounter (HOSPITAL_BASED_OUTPATIENT_CLINIC_OR_DEPARTMENT_OTHER): Payer: Self-pay | Admitting: Emergency Medicine

## 2021-07-06 ENCOUNTER — Emergency Department (HOSPITAL_BASED_OUTPATIENT_CLINIC_OR_DEPARTMENT_OTHER): Payer: Medicaid Other

## 2021-07-06 ENCOUNTER — Other Ambulatory Visit: Payer: Self-pay

## 2021-07-06 ENCOUNTER — Emergency Department (HOSPITAL_BASED_OUTPATIENT_CLINIC_OR_DEPARTMENT_OTHER)
Admission: EM | Admit: 2021-07-06 | Discharge: 2021-07-06 | Disposition: A | Discharge status: Home / Self Care | Payer: Medicaid Other | Attending: Emergency Medicine | Admitting: Emergency Medicine

## 2021-07-06 DIAGNOSIS — N453 Epididymo-orchitis: Secondary | ICD-10-CM | POA: Diagnosis not present

## 2021-07-06 DIAGNOSIS — Z20822 Contact with and (suspected) exposure to covid-19: Secondary | ICD-10-CM | POA: Diagnosis not present

## 2021-07-06 DIAGNOSIS — Z91040 Latex allergy status: Secondary | ICD-10-CM | POA: Insufficient documentation

## 2021-07-06 DIAGNOSIS — N50819 Testicular pain, unspecified: Secondary | ICD-10-CM | POA: Diagnosis present

## 2021-07-06 LAB — URINALYSIS, ROUTINE W REFLEX MICROSCOPIC
Bilirubin Urine: NEGATIVE
Glucose, UA: NEGATIVE mg/dL
Hgb urine dipstick: NEGATIVE
Ketones, ur: NEGATIVE mg/dL
Nitrite: NEGATIVE
Specific Gravity, Urine: 1.023 (ref 1.005–1.030)
pH: 6 (ref 5.0–8.0)

## 2021-07-06 LAB — SARS CORONAVIRUS 2 BY RT PCR: SARS Coronavirus 2 by RT PCR: NEGATIVE

## 2021-07-06 MED ORDER — IBUPROFEN 800 MG PO TABS
800.0000 mg | ORAL_TABLET | Freq: Once | ORAL | Status: AC
  Administered 2021-07-06: 800 mg via ORAL
  Filled 2021-07-06: qty 1

## 2021-07-06 MED ORDER — CEFTRIAXONE SODIUM 500 MG IJ SOLR
500.0000 mg | Freq: Once | INTRAMUSCULAR | Status: AC
  Administered 2021-07-06: 500 mg via INTRAMUSCULAR
  Filled 2021-07-06: qty 500

## 2021-07-06 MED ORDER — DOXYCYCLINE HYCLATE 100 MG PO CAPS: 100.0000 mg | ORAL_CAPSULE | Freq: Two times a day (BID) | ORAL | 0 refills | Status: AC

## 2021-07-06 NOTE — Discharge Instructions (Signed)
Read the information about epididymitis and orchitis attached to these discharge papers.  You may follow-up on your STD testing in your online portal.  Make sure your sexual partners also get tested.  The antibiotics at the pharmacy will cover you for chlamydia and the shot you got covers you for gonorrhea.
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
## 2021-07-06 NOTE — ED Notes (Signed)
RN provided AVS using Teachback Method. Patient verbalizes understanding of Discharge Instructions. Opportunity for Questioning and Answers were provided by RN. Patient Discharged from ED ambulatory to Home via Self.  
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
## 2021-07-07 LAB — GC/CHLAMYDIA PROBE AMP (~~LOC~~) NOT AT ARMC
Chlamydia: POSITIVE — AB
Comment: NEGATIVE
Comment: NORMAL
Neisseria Gonorrhea: NEGATIVE

## 2021-07-13 NOTE — Progress Notes (Signed)
Remote pacemaker transmission.   

## 2021-07-14 ENCOUNTER — Telehealth: Payer: Self-pay | Admitting: Internal Medicine
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
## 2021-07-17 NOTE — Telephone Encounter (Signed)
Left detailed message for Pt requesting call back to clarify Pt request.

## 2021-07-19 ENCOUNTER — Telehealth: Payer: Self-pay | Admitting: Internal Medicine

## 2021-07-19 NOTE — Telephone Encounter (Signed)
Patient has moved to [facility] and calling to see if our office can refer him to a good cardiologist. Please advise

## 2021-07-19 NOTE — Telephone Encounter (Signed)
Spoke with patient informed him that once he establish with Cardiologist/EP MD there they will request his records and will electronically transfer remote monitoring to new clinic. Patient voiced understanding also advised patient to keep [facility] monitor plugged up beside his bed.

## 2021-07-19 NOTE — Telephone Encounter (Signed)
Please Inform Patient That I have no contacts in [place]   best of luck with the move  Thanks

## 2021-07-19 NOTE — Telephone Encounter (Signed)
Pt is calling to let Dr. Graciela Husbands and our device team know that he just moved to [facility] and will need a referral to an EP MD out there to take over his care and  follow his device.  Pt is asking who would Dr. Graciela Husbands advise to see for his cardiac needs, as well as how would he go able getting his device followed at his new location.   Informed the pt that Dr. Graciela Husbands and RN are both out of the office today.  Pt aware I will forward this information to both Dr. Graciela Husbands and his nurse, as well as our device team, so that they can further advise and assist the pt with this matter.  Pt also states that the device team called him a day or so ago and left him a message to call back.  He is returning that call today.  Pt verbalized understanding and agrees with this plan.

## 2021-07-20 NOTE — Telephone Encounter (Signed)
Spoke with pt and advised per Dr Graciela Husbands he has no contacts in the [place] so pt will need to find and EP cardiologist once in [place].  Pt verbalizes understanding and thanked Charity fundraiser for the call.

## 2021-08-08 ENCOUNTER — Encounter (INDEPENDENT_AMBULATORY_CARE_PROVIDER_SITE_OTHER): Payer: Self-pay

## 2021-08-31 ENCOUNTER — Telehealth: Payer: Self-pay | Admitting: Internal Medicine
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
## 2021-08-31 NOTE — Telephone Encounter (Signed)
Pt would like a callback regarding a letter that was supposed to be mailed to him. Pt would like a callback regarding this matter. Please advise

## 2021-09-27 ENCOUNTER — Other Ambulatory Visit: Payer: Self-pay

## 2021-09-27 ENCOUNTER — Telehealth: Payer: Self-pay | Admitting: Internal Medicine

## 2021-09-27 MED ORDER — LISINOPRIL 5 MG PO TABS: ORAL_TABLET | ORAL | 2 refills | Status: AC

## 2021-09-27 MED ORDER — CARVEDILOL 3.125 MG PO TABS: ORAL_TABLET | ORAL | 2 refills | Status: AC

## 2021-09-27 NOTE — Telephone Encounter (Signed)
Pt c/o medication issue:  1. Name of Medication:  carvedilol (COREG) 3.125 MG tablet lisinopril (ZESTRIL) 5 MG tablet  2. How are you currently taking this medication (dosage and times per day)?  TAKE 1 TABLET(3.125 MG) BY MOUTH TWICE DAILY WITH A MEAL TAKE 1 TABLET(5 MG) BY MOUTH DAILY  3. Are you having a reaction (difficulty breathing--STAT)? no  4. What is your medication issue? Patient hasn't taken these two medications for the past three weeks.  He wants to know if this can be causing him to be sleepy even thou he is getting adequate sleep.   He also wants to know about the status of a letter he requested 3 weeks ago.

## 2021-09-27 NOTE — Telephone Encounter (Signed)
Pt's medications were sent to pt's pharmacy a requested. Confirmation received.  

## 2021-09-28 ENCOUNTER — Ambulatory Visit (INDEPENDENT_AMBULATORY_CARE_PROVIDER_SITE_OTHER): Payer: Medicaid Other

## 2021-09-28 DIAGNOSIS — I442 Atrioventricular block, complete: Secondary | ICD-10-CM | POA: Diagnosis not present

## 2021-09-28 NOTE — Telephone Encounter (Signed)
Spoke with pt who states he has moved to [place].  He complains of being "sleepy" even though he is sleeping well. He denies current CP, SOB or dizziness. He has not filled his Carvedilol or his Lisinopril in the past 3 weeks due to needing to have the prescriptions transferred to another pharmacy in [place].  I do see that these medications were sent in yesterday bu our office to [facility] in [place].  Pt encouraged to take medications as prescribed. Pt advised it is very important for him to establish care as soon as possible with a local PCP and EP cardiologist.  Pt advised if symptoms continue or worsen after he restarts medication to contact our office or his new providers if he establishes between now and that time.   Pt advised he was given a copy of his last device interrogation at his office visit and a copy has also been mailed to him.  Pt verbalizes understanding and thanked Therapist, sports for the call.

## 2021-09-29 LAB — CUP PACEART REMOTE DEVICE CHECK
Battery Remaining Longevity: 23 mo
Battery Remaining Percentage: 29 %
Battery Voltage: 2.93 V
Brady Statistic AP VP Percent: 28 %
Brady Statistic AP VS Percent: 1 %
Brady Statistic AS VP Percent: 72 %
Brady Statistic AS VS Percent: 1 %
Brady Statistic RA Percent Paced: 26 %
Date Time Interrogation Session: 20230928174709
Implantable Lead Implant Date: 20160907
Implantable Lead Implant Date: 20160907
Implantable Lead Implant Date: 20190404
Implantable Lead Location: 753858
Implantable Lead Location: 753859
Implantable Lead Location: 753860
Implantable Lead Model: 1948
Implantable Pulse Generator Implant Date: 20190404
Lead Channel Impedance Value: 430 Ohm
Lead Channel Impedance Value: 560 Ohm
Lead Channel Impedance Value: 760 Ohm
Lead Channel Pacing Threshold Amplitude: 0.625 V
Lead Channel Pacing Threshold Amplitude: 0.625 V
Lead Channel Pacing Threshold Amplitude: 2 V
Lead Channel Pacing Threshold Pulse Width: 0.5 ms
Lead Channel Pacing Threshold Pulse Width: 0.5 ms
Lead Channel Pacing Threshold Pulse Width: 0.5 ms
Lead Channel Sensing Intrinsic Amplitude: 2.3 mV
Lead Channel Sensing Intrinsic Amplitude: 7.9 mV
Lead Channel Setting Pacing Amplitude: 1.625
Lead Channel Setting Pacing Amplitude: 2 V
Lead Channel Setting Pacing Amplitude: 3 V
Lead Channel Setting Pacing Pulse Width: 0.5 ms
Lead Channel Setting Pacing Pulse Width: 0.5 ms
Lead Channel Setting Sensing Sensitivity: 2.5 mV
Pulse Gen Model: 3262
Pulse Gen Serial Number: 9003554

## 2021-10-04 NOTE — Progress Notes (Signed)
Remote pacemaker transmission.   

## 2021-12-28 ENCOUNTER — Ambulatory Visit (INDEPENDENT_AMBULATORY_CARE_PROVIDER_SITE_OTHER): Payer: Medicaid Other

## 2021-12-28 DIAGNOSIS — I442 Atrioventricular block, complete: Secondary | ICD-10-CM | POA: Diagnosis not present

## 2021-12-29 LAB — CUP PACEART REMOTE DEVICE CHECK
Battery Remaining Longevity: 20 mo
Battery Remaining Percentage: 25 %
Battery Voltage: 2.92 V
Brady Statistic AP VP Percent: 29 %
Brady Statistic AP VS Percent: 1 %
Brady Statistic AS VP Percent: 71 %
Brady Statistic AS VS Percent: 1 %
Brady Statistic RA Percent Paced: 27 %
Date Time Interrogation Session: 20231228132856
Implantable Lead Connection Status: 753985
Implantable Lead Connection Status: 753985
Implantable Lead Connection Status: 753985
Implantable Lead Implant Date: 20160907
Implantable Lead Implant Date: 20160907
Implantable Lead Implant Date: 20190404
Implantable Lead Location: 753858
Implantable Lead Location: 753859
Implantable Lead Location: 753860
Implantable Lead Model: 1948
Implantable Pulse Generator Implant Date: 20190404
Lead Channel Impedance Value: 450 Ohm
Lead Channel Impedance Value: 560 Ohm
Lead Channel Impedance Value: 800 Ohm
Lead Channel Pacing Threshold Amplitude: 0.625 V
Lead Channel Pacing Threshold Amplitude: 0.625 V
Lead Channel Pacing Threshold Amplitude: 2 V
Lead Channel Pacing Threshold Pulse Width: 0.5 ms
Lead Channel Pacing Threshold Pulse Width: 0.5 ms
Lead Channel Pacing Threshold Pulse Width: 0.5 ms
Lead Channel Sensing Intrinsic Amplitude: 3.2 mV
Lead Channel Sensing Intrinsic Amplitude: 6.5 mV
Lead Channel Setting Pacing Amplitude: 1.625
Lead Channel Setting Pacing Amplitude: 2 V
Lead Channel Setting Pacing Amplitude: 3 V
Lead Channel Setting Pacing Pulse Width: 0.5 ms
Lead Channel Setting Pacing Pulse Width: 0.5 ms
Lead Channel Setting Sensing Sensitivity: 2.5 mV
Pulse Gen Model: 3262
Pulse Gen Serial Number: 9003554

## 2022-01-15 NOTE — Progress Notes (Signed)
Remote pacemaker transmission.   

## 2022-02-21 ENCOUNTER — Telehealth: Payer: Self-pay

## 2022-02-21 NOTE — Telephone Encounter (Signed)
Alert received from [employer]:  Merlin alert for RA sensitivity safety margin < 2:1. RA intracardiac electrogram shows varied P wave measurements with intermittent undersensing. Atrial sensitivity programmed to 1.40m.   Appears Pt may be atrially undersensing.  Will send to scheduler to schedule follow up appointment to evaluate.

## 2022-02-26 ENCOUNTER — Encounter: Payer: Self-pay | Admitting: Internal Medicine

## 2022-03-01 ENCOUNTER — Ambulatory Visit: Payer: Medicaid Other | Admitting: Physician Assistant
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
## 2022-04-09 ENCOUNTER — Telehealth: Payer: Self-pay | Admitting: Internal Medicine

## 2022-04-09 NOTE — Telephone Encounter (Signed)
Pt called in asking to speak to RN about finding a new doctor closer to where he is in [place]

## 2022-04-18 NOTE — Telephone Encounter (Signed)
Attempted phone call x 2 to phone number provided and received a message stating call could not be completed at this time.

## 2022-04-19 NOTE — Telephone Encounter (Signed)
Attempted phone call to pt.  Message continues to state call cannot be completed at this time.

## 2022-04-24 ENCOUNTER — Ambulatory Visit (INDEPENDENT_AMBULATORY_CARE_PROVIDER_SITE_OTHER): Payer: Medicaid Other

## 2022-04-24 DIAGNOSIS — I442 Atrioventricular block, complete: Secondary | ICD-10-CM

## 2022-04-25 LAB — CUP PACEART REMOTE DEVICE CHECK
Battery Remaining Longevity: 16 mo
Battery Remaining Percentage: 20 %
Battery Voltage: 2.9 V
Brady Statistic AP VP Percent: 30 %
Brady Statistic AP VS Percent: 1 %
Brady Statistic AS VP Percent: 70 %
Brady Statistic AS VS Percent: 1 %
Brady Statistic RA Percent Paced: 28 %
Date Time Interrogation Session: 20240423051622
Implantable Lead Connection Status: 753985
Implantable Lead Connection Status: 753985
Implantable Lead Connection Status: 753985
Implantable Lead Implant Date: 20160907
Implantable Lead Implant Date: 20160907
Implantable Lead Implant Date: 20190404
Implantable Lead Location: 753858
Implantable Lead Location: 753859
Implantable Lead Location: 753860
Implantable Lead Model: 1948
Implantable Pulse Generator Implant Date: 20190404
Lead Channel Impedance Value: 390 Ohm
Lead Channel Impedance Value: 490 Ohm
Lead Channel Impedance Value: 740 Ohm
Lead Channel Pacing Threshold Amplitude: 0.625 V
Lead Channel Pacing Threshold Amplitude: 0.625 V
Lead Channel Pacing Threshold Amplitude: 1.875 V
Lead Channel Pacing Threshold Pulse Width: 0.5 ms
Lead Channel Pacing Threshold Pulse Width: 0.5 ms
Lead Channel Pacing Threshold Pulse Width: 0.5 ms
Lead Channel Sensing Intrinsic Amplitude: 2.6 mV
Lead Channel Sensing Intrinsic Amplitude: 6.5 mV
Lead Channel Setting Pacing Amplitude: 1.625
Lead Channel Setting Pacing Amplitude: 2 V
Lead Channel Setting Pacing Amplitude: 2.875
Lead Channel Setting Pacing Pulse Width: 0.5 ms
Lead Channel Setting Pacing Pulse Width: 0.5 ms
Lead Channel Setting Sensing Sensitivity: 2.5 mV
Pulse Gen Model: 3262
Pulse Gen Serial Number: 9003554

## 2022-05-22 NOTE — Progress Notes (Signed)
Remote pacemaker transmission.   

## 2022-06-20 ENCOUNTER — Telehealth: Payer: Self-pay

## 2022-06-20 NOTE — Telephone Encounter (Signed)
Spoke to pt earlier. Aware to send in transmission and will discuss provider recommendation/s w/ Dr. Graciela Husbands and let him know by tomorrow.

## 2022-06-20 NOTE — Telephone Encounter (Signed)
Transmission received.  WNL.  No episodes.  See below.

## 2022-06-21 NOTE — Telephone Encounter (Signed)
Pt aware transmission did not show anything abnormal. Aware I will send some information via mychart on cardiology office near [place] so that he may reach out and establish care there. Pt agreeable to plan and appreciates the information/help.

## 2022-07-16 ENCOUNTER — Telehealth (HOSPITAL_COMMUNITY): Payer: Self-pay | Admitting: Cardiology

## 2022-07-16 NOTE — Telephone Encounter (Signed)
Ladonna with [facility] called to request device release in [employer]  Ph [phone]

## 2022-07-17 NOTE — Telephone Encounter (Signed)
Pt released to [facility]. Marked inactive in [employer] and all upcoming remotes are canceled.
